# Patient Record
Sex: Male | Born: 1950 | Race: Black or African American | Hispanic: No | Marital: Single | Smoking: Former smoker
Health system: Southern US, Community
[De-identification: ages and names within clinical notes are randomized; demographics above are authoritative.]

## PROBLEM LIST (undated history)

## (undated) DIAGNOSIS — I6990 Unspecified sequelae of unspecified cerebrovascular disease: Secondary | ICD-10-CM

## (undated) DIAGNOSIS — M6281 Muscle weakness (generalized): Secondary | ICD-10-CM

## (undated) DIAGNOSIS — K5900 Constipation, unspecified: Secondary | ICD-10-CM

## (undated) DIAGNOSIS — I639 Cerebral infarction, unspecified: Secondary | ICD-10-CM

## (undated) DIAGNOSIS — G8929 Other chronic pain: Secondary | ICD-10-CM

## (undated) DIAGNOSIS — M1990 Unspecified osteoarthritis, unspecified site: Secondary | ICD-10-CM

## (undated) DIAGNOSIS — E559 Vitamin D deficiency, unspecified: Secondary | ICD-10-CM

## (undated) DIAGNOSIS — C679 Malignant neoplasm of bladder, unspecified: Secondary | ICD-10-CM

## (undated) DIAGNOSIS — Z131 Encounter for screening for diabetes mellitus: Secondary | ICD-10-CM

## (undated) DIAGNOSIS — I498 Other specified cardiac arrhythmias: Secondary | ICD-10-CM

## (undated) DIAGNOSIS — I10 Essential (primary) hypertension: Secondary | ICD-10-CM

## (undated) DIAGNOSIS — N183 Chronic kidney disease, stage 3 unspecified: Secondary | ICD-10-CM

## (undated) DIAGNOSIS — I499 Cardiac arrhythmia, unspecified: Secondary | ICD-10-CM

## (undated) DIAGNOSIS — K2100 Gastro-esophageal reflux disease with esophagitis, without bleeding: Secondary | ICD-10-CM

## (undated) DIAGNOSIS — Z0000 Encounter for general adult medical examination without abnormal findings: Secondary | ICD-10-CM

## (undated) DIAGNOSIS — C801 Malignant (primary) neoplasm, unspecified: Secondary | ICD-10-CM

## (undated) DIAGNOSIS — N189 Chronic kidney disease, unspecified: Secondary | ICD-10-CM

## (undated) DIAGNOSIS — K219 Gastro-esophageal reflux disease without esophagitis: Secondary | ICD-10-CM

## (undated) DIAGNOSIS — IMO0002 Reserved for concepts with insufficient information to code with codable children: Secondary | ICD-10-CM

## (undated) DIAGNOSIS — G609 Hereditary and idiopathic neuropathy, unspecified: Secondary | ICD-10-CM

## (undated) DIAGNOSIS — M62838 Other muscle spasm: Secondary | ICD-10-CM

## (undated) DIAGNOSIS — R7989 Other specified abnormal findings of blood chemistry: Secondary | ICD-10-CM

## (undated) HISTORY — DX: Encounter for screening for diabetes mellitus: Z13.1

## (undated) HISTORY — DX: Other muscle spasm: M62.838

## (undated) HISTORY — DX: Unspecified sequelae of unspecified cerebrovascular disease: I69.90

## (undated) HISTORY — DX: Other specified abnormal findings of blood chemistry: R79.89

## (undated) HISTORY — DX: Muscle weakness (generalized): M62.81

## (undated) HISTORY — DX: Gastro-esophageal reflux disease with esophagitis: K21.0

## (undated) HISTORY — DX: Other chronic pain: G89.29

## (undated) HISTORY — DX: Gastro-esophageal reflux disease with esophagitis, without bleeding: K21.00

## (undated) HISTORY — DX: Vitamin D deficiency, unspecified: E55.9

## (undated) HISTORY — DX: Malignant neoplasm of bladder, unspecified: C67.9

## (undated) HISTORY — DX: Chronic kidney disease, stage 3 unspecified: N18.30

## (undated) HISTORY — DX: Constipation, unspecified: K59.00

## (undated) HISTORY — DX: Encounter for general adult medical examination without abnormal findings: Z00.00

## (undated) HISTORY — DX: Reserved for concepts with insufficient information to code with codable children: IMO0002

## (undated) HISTORY — DX: Chronic kidney disease, stage 3 (moderate): N18.3

## (undated) HISTORY — DX: Hereditary and idiopathic neuropathy, unspecified: G60.9

## (undated) HISTORY — DX: Other specified cardiac arrhythmias: I49.8

---

## 1998-04-27 ENCOUNTER — Observation Stay (HOSPITAL_COMMUNITY): Admission: RE | Admit: 1998-04-27 | Discharge: 1998-04-28 | Payer: Self-pay | Admitting: Urology

## 1998-05-11 ENCOUNTER — Ambulatory Visit (HOSPITAL_COMMUNITY): Admission: RE | Admit: 1998-05-11 | Discharge: 1998-05-11 | Payer: Self-pay | Admitting: Urology

## 1998-05-23 ENCOUNTER — Ambulatory Visit (HOSPITAL_COMMUNITY): Admission: RE | Admit: 1998-05-23 | Discharge: 1998-05-23 | Payer: Self-pay | Admitting: Urology

## 1998-05-23 ENCOUNTER — Encounter: Payer: Self-pay | Admitting: Urology

## 1999-09-18 ENCOUNTER — Encounter: Admission: RE | Admit: 1999-09-18 | Discharge: 1999-09-18 | Payer: Self-pay | Admitting: Nephrology

## 1999-09-18 ENCOUNTER — Encounter: Payer: Self-pay | Admitting: Nephrology

## 1999-12-24 ENCOUNTER — Ambulatory Visit (HOSPITAL_COMMUNITY): Admission: RE | Admit: 1999-12-24 | Discharge: 1999-12-24 | Payer: Self-pay | Admitting: Nephrology

## 1999-12-24 ENCOUNTER — Encounter: Payer: Self-pay | Admitting: Nephrology

## 2002-05-04 ENCOUNTER — Encounter: Payer: Self-pay | Admitting: Emergency Medicine

## 2002-05-04 ENCOUNTER — Emergency Department (HOSPITAL_COMMUNITY): Admission: EM | Admit: 2002-05-04 | Discharge: 2002-05-04 | Payer: Self-pay | Admitting: Emergency Medicine

## 2002-09-21 ENCOUNTER — Encounter: Admission: RE | Admit: 2002-09-21 | Discharge: 2002-09-21 | Payer: Self-pay | Admitting: Internal Medicine

## 2002-09-29 ENCOUNTER — Encounter: Admission: RE | Admit: 2002-09-29 | Discharge: 2002-09-29 | Payer: Self-pay | Admitting: Internal Medicine

## 2002-09-29 ENCOUNTER — Ambulatory Visit (HOSPITAL_COMMUNITY): Admission: RE | Admit: 2002-09-29 | Discharge: 2002-09-29 | Payer: Self-pay | Admitting: Internal Medicine

## 2002-09-29 ENCOUNTER — Encounter: Payer: Self-pay | Admitting: Internal Medicine

## 2002-10-07 ENCOUNTER — Encounter: Admission: RE | Admit: 2002-10-07 | Discharge: 2002-10-07 | Payer: Self-pay | Admitting: Internal Medicine

## 2003-06-29 ENCOUNTER — Encounter: Admission: RE | Admit: 2003-06-29 | Discharge: 2003-06-29 | Payer: Self-pay | Admitting: Internal Medicine

## 2003-06-29 ENCOUNTER — Ambulatory Visit (HOSPITAL_COMMUNITY): Admission: RE | Admit: 2003-06-29 | Discharge: 2003-06-29 | Payer: Self-pay | Admitting: Internal Medicine

## 2003-07-11 ENCOUNTER — Encounter: Admission: RE | Admit: 2003-07-11 | Discharge: 2003-07-11 | Payer: Self-pay | Admitting: Internal Medicine

## 2003-07-13 ENCOUNTER — Encounter: Admission: RE | Admit: 2003-07-13 | Discharge: 2003-07-13 | Payer: Self-pay | Admitting: Internal Medicine

## 2003-07-14 ENCOUNTER — Ambulatory Visit (HOSPITAL_COMMUNITY): Admission: RE | Admit: 2003-07-14 | Discharge: 2003-07-14 | Payer: Self-pay | Admitting: Internal Medicine

## 2003-07-14 ENCOUNTER — Encounter: Payer: Self-pay | Admitting: Cardiology

## 2003-07-27 ENCOUNTER — Encounter: Admission: RE | Admit: 2003-07-27 | Discharge: 2003-07-27 | Payer: Self-pay | Admitting: Internal Medicine

## 2003-08-11 ENCOUNTER — Encounter: Admission: RE | Admit: 2003-08-11 | Discharge: 2003-08-11 | Payer: Self-pay | Admitting: Internal Medicine

## 2003-08-25 ENCOUNTER — Encounter: Admission: RE | Admit: 2003-08-25 | Discharge: 2003-08-25 | Payer: Self-pay | Admitting: Internal Medicine

## 2003-09-19 ENCOUNTER — Encounter: Admission: RE | Admit: 2003-09-19 | Discharge: 2003-09-19 | Payer: Self-pay | Admitting: Internal Medicine

## 2003-10-10 ENCOUNTER — Encounter: Admission: RE | Admit: 2003-10-10 | Discharge: 2003-10-10 | Payer: Self-pay | Admitting: Internal Medicine

## 2003-11-10 ENCOUNTER — Encounter: Admission: RE | Admit: 2003-11-10 | Discharge: 2003-11-10 | Payer: Self-pay | Admitting: Internal Medicine

## 2003-11-30 ENCOUNTER — Encounter: Admission: RE | Admit: 2003-11-30 | Discharge: 2003-11-30 | Payer: Self-pay | Admitting: Internal Medicine

## 2003-12-23 ENCOUNTER — Encounter: Admission: RE | Admit: 2003-12-23 | Discharge: 2003-12-23 | Payer: Self-pay | Admitting: Internal Medicine

## 2004-01-04 ENCOUNTER — Ambulatory Visit (HOSPITAL_BASED_OUTPATIENT_CLINIC_OR_DEPARTMENT_OTHER): Admission: RE | Admit: 2004-01-04 | Discharge: 2004-01-04 | Payer: Self-pay | Admitting: Orthopedic Surgery

## 2004-01-04 ENCOUNTER — Ambulatory Visit (HOSPITAL_COMMUNITY): Admission: RE | Admit: 2004-01-04 | Discharge: 2004-01-04 | Payer: Self-pay | Admitting: Orthopedic Surgery

## 2004-01-19 ENCOUNTER — Encounter: Admission: RE | Admit: 2004-01-19 | Discharge: 2004-04-18 | Payer: Self-pay | Admitting: Orthopedic Surgery

## 2004-03-02 ENCOUNTER — Encounter: Admission: RE | Admit: 2004-03-02 | Discharge: 2004-03-02 | Payer: Self-pay | Admitting: Internal Medicine

## 2004-03-02 ENCOUNTER — Inpatient Hospital Stay (HOSPITAL_COMMUNITY): Admission: RE | Admit: 2004-03-02 | Discharge: 2004-03-03 | Payer: Self-pay | Admitting: Internal Medicine

## 2004-03-06 ENCOUNTER — Encounter: Admission: RE | Admit: 2004-03-06 | Discharge: 2004-03-06 | Payer: Self-pay | Admitting: Internal Medicine

## 2004-03-09 ENCOUNTER — Encounter: Admission: RE | Admit: 2004-03-09 | Discharge: 2004-03-09 | Payer: Self-pay | Admitting: Internal Medicine

## 2004-03-12 ENCOUNTER — Encounter: Admission: RE | Admit: 2004-03-12 | Discharge: 2004-03-12 | Payer: Self-pay | Admitting: Internal Medicine

## 2005-06-21 ENCOUNTER — Ambulatory Visit: Payer: Self-pay | Admitting: Internal Medicine

## 2005-07-17 ENCOUNTER — Ambulatory Visit: Payer: Self-pay | Admitting: *Deleted

## 2005-07-18 ENCOUNTER — Ambulatory Visit: Payer: Self-pay | Admitting: Internal Medicine

## 2005-08-07 ENCOUNTER — Ambulatory Visit: Payer: Self-pay | Admitting: Internal Medicine

## 2005-08-19 ENCOUNTER — Ambulatory Visit: Payer: Self-pay | Admitting: Internal Medicine

## 2005-08-23 ENCOUNTER — Ambulatory Visit: Payer: Self-pay | Admitting: Internal Medicine

## 2005-09-06 ENCOUNTER — Ambulatory Visit: Payer: Self-pay | Admitting: Internal Medicine

## 2005-09-24 ENCOUNTER — Ambulatory Visit: Payer: Self-pay | Admitting: Internal Medicine

## 2005-09-27 ENCOUNTER — Ambulatory Visit (HOSPITAL_COMMUNITY): Admission: RE | Admit: 2005-09-27 | Discharge: 2005-09-27 | Payer: Self-pay | Admitting: Internal Medicine

## 2006-04-13 ENCOUNTER — Inpatient Hospital Stay (HOSPITAL_COMMUNITY): Admission: EM | Admit: 2006-04-13 | Discharge: 2006-04-17 | Payer: Self-pay | Admitting: Emergency Medicine

## 2006-04-13 ENCOUNTER — Encounter (INDEPENDENT_AMBULATORY_CARE_PROVIDER_SITE_OTHER): Payer: Self-pay | Admitting: Cardiology

## 2006-04-13 ENCOUNTER — Encounter (INDEPENDENT_AMBULATORY_CARE_PROVIDER_SITE_OTHER): Payer: Self-pay | Admitting: Neurology

## 2006-04-16 ENCOUNTER — Ambulatory Visit: Payer: Self-pay | Admitting: Physical Medicine & Rehabilitation

## 2006-04-17 ENCOUNTER — Inpatient Hospital Stay (HOSPITAL_COMMUNITY)
Admission: RE | Admit: 2006-04-17 | Discharge: 2006-05-16 | Payer: Self-pay | Admitting: Physical Medicine & Rehabilitation

## 2006-11-06 ENCOUNTER — Ambulatory Visit: Payer: Self-pay | Admitting: Internal Medicine

## 2007-03-12 ENCOUNTER — Ambulatory Visit: Payer: Self-pay | Admitting: Internal Medicine

## 2007-03-19 ENCOUNTER — Encounter (INDEPENDENT_AMBULATORY_CARE_PROVIDER_SITE_OTHER): Payer: Self-pay | Admitting: Nurse Practitioner

## 2007-03-19 ENCOUNTER — Ambulatory Visit: Payer: Self-pay | Admitting: Internal Medicine

## 2007-03-19 LAB — CONVERTED CEMR LAB
Bilirubin Urine: NEGATIVE
Ketones, ur: NEGATIVE mg/dL
Leukocytes, UA: NEGATIVE
Urobilinogen, UA: 0.2

## 2007-03-20 ENCOUNTER — Encounter (INDEPENDENT_AMBULATORY_CARE_PROVIDER_SITE_OTHER): Payer: Self-pay | Admitting: Nurse Practitioner

## 2007-03-20 LAB — CONVERTED CEMR LAB
ALT: 9 units/L
CO2: 22 meq/L
Chloride: 105 meq/L
Glucose, Bld: 114 mg/dL
HCT: 47.8 %
Hemoglobin: 15.7 g/dL
LDL Cholesterol: 136 mg/dL
MCHC: 32.8 g/dL
MCV: 89.7 fL
Platelets: 244 10*3/uL
Potassium: 3.6 meq/L
VLDL: 18 mg/dL
WBC: 4.6 10*3/uL

## 2007-03-29 ENCOUNTER — Emergency Department (HOSPITAL_COMMUNITY): Admission: EM | Admit: 2007-03-29 | Discharge: 2007-03-30 | Payer: Self-pay | Admitting: Emergency Medicine

## 2007-05-18 ENCOUNTER — Telehealth (INDEPENDENT_AMBULATORY_CARE_PROVIDER_SITE_OTHER): Payer: Self-pay | Admitting: *Deleted

## 2007-05-19 ENCOUNTER — Encounter (INDEPENDENT_AMBULATORY_CARE_PROVIDER_SITE_OTHER): Payer: Self-pay | Admitting: Nurse Practitioner

## 2007-05-19 DIAGNOSIS — Z87898 Personal history of other specified conditions: Secondary | ICD-10-CM

## 2007-05-19 DIAGNOSIS — I6350 Cerebral infarction due to unspecified occlusion or stenosis of unspecified cerebral artery: Secondary | ICD-10-CM | POA: Insufficient documentation

## 2007-05-19 DIAGNOSIS — M1990 Unspecified osteoarthritis, unspecified site: Secondary | ICD-10-CM | POA: Insufficient documentation

## 2007-05-19 DIAGNOSIS — I10 Essential (primary) hypertension: Secondary | ICD-10-CM | POA: Insufficient documentation

## 2007-05-19 DIAGNOSIS — E785 Hyperlipidemia, unspecified: Secondary | ICD-10-CM

## 2007-05-20 ENCOUNTER — Encounter (INDEPENDENT_AMBULATORY_CARE_PROVIDER_SITE_OTHER): Payer: Self-pay | Admitting: *Deleted

## 2007-05-25 ENCOUNTER — Ambulatory Visit: Payer: Self-pay | Admitting: Nurse Practitioner

## 2007-05-25 LAB — CONVERTED CEMR LAB
HDL goal, serum: 40 mg/dL
LDL Goal: 100 mg/dL

## 2007-07-06 ENCOUNTER — Ambulatory Visit: Payer: Self-pay | Admitting: Nurse Practitioner

## 2007-08-19 ENCOUNTER — Ambulatory Visit: Payer: Self-pay | Admitting: Nurse Practitioner

## 2007-08-21 LAB — CONVERTED CEMR LAB
ALT: 8 units/L (ref 0–53)
Calcium: 9.5 mg/dL (ref 8.4–10.5)
Cholesterol: 226 mg/dL — ABNORMAL HIGH (ref 0–200)
Creatinine, Ser: 1.58 mg/dL — ABNORMAL HIGH (ref 0.40–1.50)
HDL: 64 mg/dL (ref >39)
Sodium: 141 meq/L (ref 135–145)
Total CHOL/HDL Ratio: 3.5
Total Protein: 8.1 g/dL (ref 6.0–8.3)

## 2007-10-12 ENCOUNTER — Ambulatory Visit: Payer: Self-pay | Admitting: Nurse Practitioner

## 2007-11-09 ENCOUNTER — Ambulatory Visit: Payer: Self-pay | Admitting: Nurse Practitioner

## 2007-11-23 ENCOUNTER — Ambulatory Visit: Payer: Self-pay | Admitting: Family Medicine

## 2007-12-25 ENCOUNTER — Ambulatory Visit: Payer: Self-pay | Admitting: Nurse Practitioner

## 2007-12-25 LAB — CONVERTED CEMR LAB
BUN: 20 mg/dL (ref 6–23)
Basophils Absolute: 0 10*3/uL (ref 0.0–0.1)
Calcium: 9.4 mg/dL (ref 8.4–10.5)
Chloride: 108 meq/L (ref 96–112)
Creatinine, Ser: 1.68 mg/dL — ABNORMAL HIGH (ref 0.40–1.50)
Eosinophils Absolute: 0 10*3/uL (ref 0.0–0.7)
Eosinophils Relative: 0 % (ref 0–5)
Glucose, Bld: 77 mg/dL (ref 70–99)
HCT: 46.7 % (ref 39.0–52.0)
Lymphocytes Relative: 42 % (ref 12–46)
Lymphs Abs: 1.8 10*3/uL (ref 0.7–4.0)
Monocytes Relative: 8 % (ref 3–12)
Neutro Abs: 2.1 10*3/uL (ref 1.7–7.7)
Neutrophils Relative %: 49 % (ref 43–77)
RBC: 5.23 M/uL (ref 4.22–5.81)
Sodium: 144 meq/L (ref 135–145)
Total Protein: 7.7 g/dL (ref 6.0–8.3)

## 2008-01-08 ENCOUNTER — Ambulatory Visit: Payer: Self-pay | Admitting: Internal Medicine

## 2008-02-17 ENCOUNTER — Ambulatory Visit: Payer: Self-pay | Admitting: Nurse Practitioner

## 2008-02-17 DIAGNOSIS — M79609 Pain in unspecified limb: Secondary | ICD-10-CM

## 2008-03-02 ENCOUNTER — Encounter (INDEPENDENT_AMBULATORY_CARE_PROVIDER_SITE_OTHER): Payer: Self-pay | Admitting: Nurse Practitioner

## 2008-03-02 ENCOUNTER — Encounter: Admission: RE | Admit: 2008-03-02 | Discharge: 2008-04-22 | Payer: Self-pay | Admitting: Nurse Practitioner

## 2008-03-18 ENCOUNTER — Encounter (INDEPENDENT_AMBULATORY_CARE_PROVIDER_SITE_OTHER): Payer: Self-pay | Admitting: Nurse Practitioner

## 2008-03-28 ENCOUNTER — Encounter (INDEPENDENT_AMBULATORY_CARE_PROVIDER_SITE_OTHER): Payer: Self-pay | Admitting: Nurse Practitioner

## 2008-04-21 ENCOUNTER — Encounter (INDEPENDENT_AMBULATORY_CARE_PROVIDER_SITE_OTHER): Payer: Self-pay | Admitting: Nurse Practitioner

## 2008-05-11 ENCOUNTER — Ambulatory Visit: Payer: Self-pay | Admitting: Nurse Practitioner

## 2008-06-17 ENCOUNTER — Emergency Department (HOSPITAL_COMMUNITY): Admission: EM | Admit: 2008-06-17 | Discharge: 2008-06-17 | Payer: Self-pay | Admitting: Emergency Medicine

## 2008-07-08 ENCOUNTER — Ambulatory Visit: Payer: Self-pay | Admitting: Nurse Practitioner

## 2008-07-15 ENCOUNTER — Telehealth (INDEPENDENT_AMBULATORY_CARE_PROVIDER_SITE_OTHER): Payer: Self-pay | Admitting: Nurse Practitioner

## 2008-08-23 ENCOUNTER — Ambulatory Visit: Payer: Self-pay | Admitting: Nurse Practitioner

## 2008-08-24 LAB — CONVERTED CEMR LAB
AST: 13 units/L (ref 0–37)
Basophils Relative: 0 % (ref 0–1)
Calcium: 9.5 mg/dL (ref 8.4–10.5)
Cholesterol: 176 mg/dL (ref 0–200)
Creatinine, Ser: 1.6 mg/dL — ABNORMAL HIGH (ref 0.40–1.50)
Eosinophils Absolute: 0 10*3/uL (ref 0.0–0.7)
Glucose, Bld: 86 mg/dL (ref 70–99)
HDL: 60 mg/dL (ref >39)
Hemoglobin: 14.8 g/dL (ref 13.0–17.0)
LDL Cholesterol: 98 mg/dL (ref 0–99)
Lymphocytes Relative: 39 % (ref 12–46)
MCHC: 32.2 g/dL (ref 30.0–36.0)
MCV: 89.8 fL (ref 78.0–100.0)
Monocytes Relative: 8 % (ref 3–12)
Sodium: 142 meq/L (ref 135–145)
Total Bilirubin: 1.3 mg/dL — ABNORMAL HIGH (ref 0.3–1.2)
Total CHOL/HDL Ratio: 2.9
Total Protein: 7.8 g/dL (ref 6.0–8.3)
Triglycerides: 88 mg/dL (ref <150)
VLDL: 18 mg/dL (ref 0–40)

## 2008-09-05 ENCOUNTER — Encounter (INDEPENDENT_AMBULATORY_CARE_PROVIDER_SITE_OTHER): Payer: Self-pay | Admitting: Nurse Practitioner

## 2008-09-07 ENCOUNTER — Encounter: Payer: Self-pay | Admitting: Cardiology

## 2008-09-07 ENCOUNTER — Encounter (INDEPENDENT_AMBULATORY_CARE_PROVIDER_SITE_OTHER): Payer: Self-pay | Admitting: Nurse Practitioner

## 2008-09-07 ENCOUNTER — Ambulatory Visit: Payer: Self-pay | Admitting: Vascular Surgery

## 2008-09-07 ENCOUNTER — Ambulatory Visit: Admission: RE | Admit: 2008-09-07 | Discharge: 2008-09-07 | Payer: Self-pay | Admitting: Cardiology

## 2008-09-08 ENCOUNTER — Encounter (INDEPENDENT_AMBULATORY_CARE_PROVIDER_SITE_OTHER): Payer: Self-pay | Admitting: Nurse Practitioner

## 2008-09-09 ENCOUNTER — Encounter (INDEPENDENT_AMBULATORY_CARE_PROVIDER_SITE_OTHER): Payer: Self-pay | Admitting: Nurse Practitioner

## 2008-11-04 ENCOUNTER — Encounter (INDEPENDENT_AMBULATORY_CARE_PROVIDER_SITE_OTHER): Payer: Self-pay | Admitting: Nurse Practitioner

## 2008-11-10 ENCOUNTER — Encounter (INDEPENDENT_AMBULATORY_CARE_PROVIDER_SITE_OTHER): Payer: Self-pay | Admitting: Nurse Practitioner

## 2008-11-11 ENCOUNTER — Ambulatory Visit: Payer: Self-pay | Admitting: Nurse Practitioner

## 2008-12-28 ENCOUNTER — Encounter (INDEPENDENT_AMBULATORY_CARE_PROVIDER_SITE_OTHER): Payer: Self-pay | Admitting: Nurse Practitioner

## 2009-01-11 ENCOUNTER — Ambulatory Visit: Payer: Self-pay | Admitting: Nurse Practitioner

## 2009-01-11 LAB — CONVERTED CEMR LAB
ALT: 10 units/L (ref 0–53)
AST: 14 units/L (ref 0–37)
CO2: 22 meq/L (ref 19–32)
Calcium: 8.8 mg/dL (ref 8.4–10.5)
Glucose, Bld: 88 mg/dL (ref 70–99)
HDL: 57 mg/dL (ref >39)
LDL Cholesterol: 109 mg/dL — ABNORMAL HIGH (ref 0–99)
Sodium: 140 meq/L (ref 135–145)
Triglycerides: 85 mg/dL (ref <150)

## 2009-01-12 ENCOUNTER — Encounter (INDEPENDENT_AMBULATORY_CARE_PROVIDER_SITE_OTHER): Payer: Self-pay | Admitting: Nurse Practitioner

## 2009-06-08 ENCOUNTER — Ambulatory Visit: Payer: Self-pay | Admitting: Nurse Practitioner

## 2009-06-08 DIAGNOSIS — L03039 Cellulitis of unspecified toe: Secondary | ICD-10-CM

## 2009-06-12 ENCOUNTER — Encounter (INDEPENDENT_AMBULATORY_CARE_PROVIDER_SITE_OTHER): Payer: Self-pay | Admitting: Nurse Practitioner

## 2009-06-23 ENCOUNTER — Ambulatory Visit: Payer: Self-pay | Admitting: Nurse Practitioner

## 2009-07-12 ENCOUNTER — Ambulatory Visit: Payer: Self-pay | Admitting: Nurse Practitioner

## 2009-08-04 LAB — HM COLONOSCOPY

## 2009-08-23 ENCOUNTER — Ambulatory Visit: Payer: Self-pay | Admitting: Nurse Practitioner

## 2009-11-07 ENCOUNTER — Ambulatory Visit: Payer: Self-pay | Admitting: Nurse Practitioner

## 2009-11-07 ENCOUNTER — Emergency Department (HOSPITAL_COMMUNITY): Admission: EM | Admit: 2009-11-07 | Discharge: 2009-11-07 | Payer: Self-pay | Admitting: Family Medicine

## 2009-12-15 ENCOUNTER — Ambulatory Visit: Payer: Self-pay | Admitting: Nurse Practitioner

## 2009-12-15 DIAGNOSIS — M25519 Pain in unspecified shoulder: Secondary | ICD-10-CM

## 2009-12-18 ENCOUNTER — Encounter (INDEPENDENT_AMBULATORY_CARE_PROVIDER_SITE_OTHER): Payer: Self-pay | Admitting: Nurse Practitioner

## 2009-12-18 ENCOUNTER — Ambulatory Visit (HOSPITAL_COMMUNITY): Admission: RE | Admit: 2009-12-18 | Discharge: 2009-12-18 | Payer: Self-pay | Admitting: Nurse Practitioner

## 2009-12-21 ENCOUNTER — Ambulatory Visit: Payer: Self-pay | Admitting: Nurse Practitioner

## 2010-01-11 ENCOUNTER — Telehealth (INDEPENDENT_AMBULATORY_CARE_PROVIDER_SITE_OTHER): Payer: Self-pay | Admitting: *Deleted

## 2010-01-12 ENCOUNTER — Encounter (INDEPENDENT_AMBULATORY_CARE_PROVIDER_SITE_OTHER): Payer: Self-pay | Admitting: Nurse Practitioner

## 2010-01-18 ENCOUNTER — Ambulatory Visit: Payer: Self-pay | Admitting: Nurse Practitioner

## 2010-02-08 ENCOUNTER — Ambulatory Visit: Payer: Self-pay | Admitting: Nurse Practitioner

## 2010-02-08 LAB — CONVERTED CEMR LAB
ALT: 8 units/L (ref 0–53)
Albumin: 3.9 g/dL (ref 3.5–5.2)
Alkaline Phosphatase: 54 units/L (ref 39–117)
BUN: 19 mg/dL (ref 6–23)
CO2: 22 meq/L (ref 19–32)
Calcium: 8.8 mg/dL (ref 8.4–10.5)
Eosinophils Absolute: 0.1 10*3/uL (ref 0.0–0.7)
Glucose, Bld: 125 mg/dL — ABNORMAL HIGH (ref 70–99)
Lymphocytes Relative: 35 % (ref 12–46)
Lymphs Abs: 1.8 10*3/uL (ref 0.7–4.0)
Monocytes Absolute: 0.3 10*3/uL (ref 0.1–1.0)
Neutro Abs: 2.9 10*3/uL (ref 1.7–7.7)
Platelets: 241 10*3/uL (ref 150–400)
RDW: 14.8 % (ref 11.5–15.5)
Sodium: 142 meq/L (ref 135–145)
Total Bilirubin: 0.4 mg/dL (ref 0.3–1.2)
Total Protein: 6.5 g/dL (ref 6.0–8.3)
Triglycerides: 74 mg/dL (ref <150)

## 2010-02-09 ENCOUNTER — Encounter (INDEPENDENT_AMBULATORY_CARE_PROVIDER_SITE_OTHER): Payer: Self-pay | Admitting: Nurse Practitioner

## 2010-04-14 ENCOUNTER — Emergency Department (HOSPITAL_COMMUNITY): Admission: EM | Admit: 2010-04-14 | Discharge: 2010-04-14 | Payer: Self-pay | Admitting: Emergency Medicine

## 2010-04-20 ENCOUNTER — Ambulatory Visit: Payer: Self-pay | Admitting: Nurse Practitioner

## 2010-04-20 ENCOUNTER — Telehealth (INDEPENDENT_AMBULATORY_CARE_PROVIDER_SITE_OTHER): Payer: Self-pay | Admitting: Nurse Practitioner

## 2010-04-26 ENCOUNTER — Encounter (INDEPENDENT_AMBULATORY_CARE_PROVIDER_SITE_OTHER): Payer: Self-pay | Admitting: Nurse Practitioner

## 2010-05-10 ENCOUNTER — Encounter (INDEPENDENT_AMBULATORY_CARE_PROVIDER_SITE_OTHER): Payer: Self-pay | Admitting: Nurse Practitioner

## 2010-05-21 ENCOUNTER — Ambulatory Visit: Payer: Self-pay | Admitting: Nurse Practitioner

## 2010-06-14 ENCOUNTER — Encounter (INDEPENDENT_AMBULATORY_CARE_PROVIDER_SITE_OTHER): Payer: Self-pay | Admitting: Nurse Practitioner

## 2010-06-14 DIAGNOSIS — K648 Other hemorrhoids: Secondary | ICD-10-CM | POA: Insufficient documentation

## 2010-06-14 DIAGNOSIS — K5730 Diverticulosis of large intestine without perforation or abscess without bleeding: Secondary | ICD-10-CM | POA: Insufficient documentation

## 2010-06-19 ENCOUNTER — Encounter (INDEPENDENT_AMBULATORY_CARE_PROVIDER_SITE_OTHER): Payer: Self-pay | Admitting: Nurse Practitioner

## 2010-07-23 ENCOUNTER — Ambulatory Visit: Payer: Self-pay | Admitting: Nurse Practitioner

## 2010-08-30 ENCOUNTER — Ambulatory Visit: Payer: Self-pay | Admitting: Nurse Practitioner

## 2010-10-02 ENCOUNTER — Encounter (INDEPENDENT_AMBULATORY_CARE_PROVIDER_SITE_OTHER): Payer: Self-pay | Admitting: Nurse Practitioner

## 2010-10-04 ENCOUNTER — Encounter (INDEPENDENT_AMBULATORY_CARE_PROVIDER_SITE_OTHER): Payer: Self-pay | Admitting: Nurse Practitioner

## 2010-10-04 NOTE — Assessment & Plan Note (Signed)
Summary: HTN   Vital Signs:  Patient profile:   60 year old male Weight:      171.0 pounds BMI:     26.09 Temp:     97.6 degrees F oral Pulse rate:   70 / minute Pulse rhythm:   regular Resp:     20 per minute BP sitting:   163 / 107  (left arm) Cuff size:   regular  Vitals Entered By: Levon Hedger (April 20, 2010 10:18 AM)  Nutrition Counseling: Patient's BMI is greater than 25 and therefore counseled on weight management options. CC: follow-up visit 3 month BP, Lipid Management Is Patient Diabetic? No Pain Assessment Patient in pain? no       Does patient need assistance? Functional Status Self care Ambulation Normal   CC:  follow-up visit 3 month BP and Lipid Management.  History of Present Illness:  Pt into the office for f/u on blood pressure. Pt reports that he has not been taking his medications as ordered. Last month his blood pressure was doing great when he was taking all his medications as ordered.  Pt did NOT bring meds into the office.    Lipid Management History:      Positive NCEP/ATP III risk factors include male age 28 years old or older, hypertension, and prior stroke (or TIA).  Negative NCEP/ATP III risk factors include non-diabetic, non-tobacco-user status, no ASHD (atherosclerotic heart disease), no peripheral vascular disease, and no history of aortic aneurysm.        The patient states that he knows about the "Therapeutic Lifestyle Change" diet.  His compliance with the TLC diet is poor.  The patient does not know about adjunctive measures for cholesterol lowering.  He expresses no side effects from his lipid-lowering medication.  Comments include: advised pt to take meds as ordered.  The patient denies any symptoms to suggest myopathy or liver disease.     Allergies (verified): 1)  ! Penicillin 2)  ! * Lyrica  Review of Systems General:  Denies fever. CV:  Denies chest pain or discomfort. Resp:  Denies cough. GI:  Denies abdominal  pain, nausea, and vomiting. GU:  Complains of hematuria; One episode since his last visit here. MS:  left leg weakness - supposed to wear a brace to the left leg but pt reports that his leg is doing well so he is not wearing the brace today.Marland Kitchen  Physical Exam  General:  alert.   Head:  normocephalic.   Lungs:  normal breath sounds.   Heart:  normal rate and regular rhythm.   Abdomen:  normal bowel sounds.   Rectal:  no external abnormalities.  guaiac negative Prostate:  prostate enlargement R>L Msk:  left side hemiparesis Neurologic:  cane   Impression & Recommendations:  Problem # 1:  HYPERTENSION (ICD-401.9) BP is elevated but pt has not taken meds Advised pt that he really needs to take meds as ordered His updated medication list for this problem includes:    Cardura 4 Mg Tabs (Doxazosin mesylate) ..... One tablet by mouth nightly    Clonidine Hcl 0.1 Mg Tabs (Clonidine hcl) .Marland Kitchen... 1 tablet by mouth two times a day    Lisinopril 40 Mg Tabs (Lisinopril) .Marland Kitchen... 1 tablet by mouth daily for blood pressure    Norvasc 10 Mg Tabs (Amlodipine besylate) ..... One tablet by mouth daily for blood pressure  Problem # 2:  HYPERLIPIDEMIA (ICD-272.4)  His updated medication list for this problem includes:    Pravastatin  Sodium 40 Mg Tabs (Pravastatin sodium) .Marland Kitchen... 1 tablet by mouth at night for cholesterol  Problem # 3:  BENIGN PROSTATIC HYPERTROPHY, HX OF (ICD-V13.8) prostate exam done today pt needs to take med for BPH daily Orders: Hemoccult Guaiac-1 spec.(in office) (82270) Colonoscopy (Colon)  Problem # 4:  LEG PAIN, LEFT (ICD-729.5) advised pt that he needs to wear a brace  Complete Medication List: 1)  Cardura 4 Mg Tabs (Doxazosin mesylate) .... One tablet by mouth nightly 2)  Pravastatin Sodium 40 Mg Tabs (Pravastatin sodium) .Marland Kitchen.. 1 tablet by mouth at night for cholesterol 3)  Multivitamins Tabs (Multiple vitamin) .Marland Kitchen.. 1 tablet by mouth daily 4)  Aspirin Ec 325 Mg Tbec  (Aspirin) .... Take one (1) tablet each day 5)  Clonidine Hcl 0.1 Mg Tabs (Clonidine hcl) .Marland Kitchen.. 1 tablet by mouth two times a day 6)  Ibuprofen 800 Mg Tabs (Ibuprofen) .Marland Kitchen.. 1 tablet by mouth two times a day as needed for pain 7)  Lisinopril 40 Mg Tabs (Lisinopril) .Marland Kitchen.. 1 tablet by mouth daily for blood pressure 8)  Norvasc 10 Mg Tabs (Amlodipine besylate) .... One tablet by mouth daily for blood pressure 9)  Tramadol Hcl 50 Mg Tabs (Tramadol hcl) .... One tablet by mouth two times a day as needed  Lipid Assessment/Plan:      Based on NCEP/ATP III, the patient's risk factor category is "history of coronary disease, peripheral vascular disease, cerebrovascular disease, or aortic aneurysm".  The patient's lipid goals are as follows: Total cholesterol goal is 200; LDL cholesterol goal is 100; HDL cholesterol goal is 40; Triglyceride goal is 150.  His LDL cholesterol goal has not been met.  He has been provided with dietary instructions to lower his cholesterol.    Patient Instructions: 1)  You will be referred for a colonscopy.  You will be notified of the time/date of the appointment. 2)  [facility] - I will see if you can start getting your medications delivered to your home 3)  Follow up in 1 month with n.martin,fnp for blood pressure.   4)  Take your medications before this visit.    Laboratory Results  Date/Time Received: April 20, 2010 12:03 PM   Stool - Occult Blood Hemmoccult #1: negative Date: 04/20/2010

## 2010-10-04 NOTE — Progress Notes (Signed)
Summary: Pharmacy Referral  Phone Note Outgoing Call   Summary of Call: [facility] - see me about this for the referral (you should have sheet from last week) Pt is on over 8 meds and he also had medicare pt also needs a colonscopy - referral ordered Initial call taken by: Lehman Prom FNP,  April 20, 2010 11:50 AM  Follow-up for Phone Call        I SEND THE REFERRAL TO [facility] . Follow-up by: Cheryll Dessert,  April 20, 2010 12:30 PM

## 2010-10-04 NOTE — Progress Notes (Signed)
Summary: Handicap Placcard  Phone Note Call from Patient   Summary of Call: PT BROUGHT  BY HANDICAPPED PLACARD/PUT IN YOUR REFILL SLOT Initial call taken by: Arta Bruce,  Jan 11, 2010 11:56 AM  Follow-up for Phone Call        form completed and in office make copy for chart pt to pick up original Follow-up by: Lehman Prom FNP,  Jan 11, 2010 2:34 PM  Additional Follow-up for Phone Call Additional follow up Details #1::        CALLED PT TO PICK UP Additional Follow-up by: Arta Bruce,  Jan 12, 2010 9:30 AM

## 2010-10-04 NOTE — Letter (Signed)
Summary: TEST ORDER FORM/REFAXED//RADOIOLOGY  TEST ORDER FORM/REFAXED//RADOIOLOGY   Imported By: Arta Bruce 12/18/2009 11:24:56  _____________________________________________________________________  External Attachment:    Type:   Image     Comment:   External Document

## 2010-10-04 NOTE — Letter (Signed)
Summary: *HSN Results Follow up  [facility]  [address] [place], [place] [address]   Phone: [phone]  Fax: [phone]      02/09/2010   [name] [address] [place], [place]  [address]   Dear  Mr. William Collier,                            ____S.Drinkard,FNP   ____D. Gore,FNP       ____B. McPherson,MD   ____V. Rankins,MD    ____E. Mulberry,MD    __X__N. Daphine Deutscher, FNP  ____D. Reche Dixon, MD    ____K. Philipp Deputy, MD    ____Other     This letter is to inform you that your recent test(s):  _______Pap Smear    ___X____Lab Test     _______X-ray     Comments:  Labs done during your last office visis are ok except one of your kidney labs is slightly elevated.  This lab has been elevated for the past year without any significant change so will just monitor for now.         _________________________________________________________ If you have any questions, please contact our office [phone].                    Sincerely,    Lehman Prom FNP [facility]

## 2010-10-04 NOTE — Medication Information (Signed)
Summary: NEW PT MEDICIATION REQUEST//FAXED  NEW PT MEDICIATION REQUEST//FAXED   Imported By: Arta Bruce 05/10/2010 09:25:50  _____________________________________________________________________  External Attachment:    Type:   Image     Comment:   External Document

## 2010-10-04 NOTE — Letter (Signed)
Summary: [facility]  [facility]   Imported By: Arta Bruce 06/20/2010 11:48:01  _____________________________________________________________________  External Attachment:    Type:   Image     Comment:   External Document

## 2010-10-04 NOTE — Miscellaneous (Signed)
Summary: Colonscopy results  Clinical Lists Changes  Problems: Added new problem of DIVERTICULOSIS, COLON (ICD-562.10) - noted on colonscopy Added new problem of INTERNAL HEMORRHOIDS (ICD-455.0) - noted on colonscopy Observations: Added new observation of COLONNXTDUE: 06/2013 (06/19/2010 14:10) Added new observation of COLONRECACT: Repeat colonoscopy in 3 years due to number of polyps and large size   (06/14/2010 14:11) Added new observation of COLONOSCOPY: Colon polyps - multiple, all resected with snare technique diverticulosis, universal mild internal hemorrhoids (06/14/2010 14:11)      Colonoscopy  Procedure date:  06/14/2010  Findings:      Colon polyps - multiple, all resected with snare technique diverticulosis, universal mild internal hemorrhoids  Comments:      Repeat colonoscopy in 3 years due to number of polyps and large size    Procedures Next Due Date:    Colonoscopy: 06/2013   Colonoscopy  Procedure date:  06/14/2010  Findings:      Colon polyps - multiple, all resected with snare technique diverticulosis, universal mild internal hemorrhoids  Comments:      Repeat colonoscopy in 3 years due to number of polyps and large size    Procedures Next Due Date:    Colonoscopy: 06/2013

## 2010-10-04 NOTE — Assessment & Plan Note (Signed)
Summary: HTN   Vital Signs:  Patient profile:   60 year old male Weight:      173.7 pounds BMI:     26.51 Temp:     97.2 degrees F oral Pulse rate:   80 / minute Pulse rhythm:   regular Resp:     20 per minute BP sitting:   126 / 90  (left arm) Cuff size:   regular  Vitals Entered By: Levon Hedger (August 30, 2010 11:57 AM)  Nutrition Counseling: Patient's BMI is greater than 25 and therefore counseled on weight management options. CC: follow-up visit Bp...pain in Left arm...pt states sometimes it feels like this arm is going to fall off., Hypertension Management, Lipid Management Pain Assessment Patient in pain? yes     Location: left arm Intensity: 4-5  Does patient need assistance? Functional Status Self care Ambulation Normal   CC:  follow-up visit Bp...pain in Left arm...pt states sometimes it feels like this arm is going to fall off., Hypertension Management, and Lipid Management.  History of Present Illness:  Pt into the office for f/u on htn  Pt did not bring his medications today but he was established with [facility]. They are delivering the medications to his home.  He is being complaint with meds.  Pt is in his usual state of health  Hypertension History:      He denies headache, chest pain, and palpitations.  He notes no problems with any antihypertensive medication side effects.  Pt is taking meds as ordered.        Positive major cardiovascular risk factors include male age 45 years old or older, hyperlipidemia, and hypertension.  Negative major cardiovascular risk factors include no history of diabetes and non-tobacco-user status.        Positive history for target organ damage include prior stroke (or TIA).  Further assessment for target organ damage reveals no history of ASHD, cardiac end-organ damage (CHF/LVH), peripheral vascular disease, renal insufficiency, or hypertensive retinopathy.    Lipid Management History:      Positive  NCEP/ATP III risk factors include male age 35 years old or older, hypertension, and prior stroke (or TIA).  Negative NCEP/ATP III risk factors include non-diabetic, non-tobacco-user status, no ASHD (atherosclerotic heart disease), no peripheral vascular disease, and no history of aortic aneurysm.        The patient states that he knows about the "Therapeutic Lifestyle Change" diet.  His compliance with the TLC diet is poor.  The patient does not know about adjunctive measures for cholesterol lowering.  He expresses no side effects from his lipid-lowering medication.  The patient denies any symptoms to suggest myopathy or liver disease.      Allergies (verified): 1)  ! Penicillin 2)  ! * Lyrica  Review of Systems CV:  Denies chest pain or discomfort. Resp:  Denies cough. GI:  Denies abdominal pain, nausea, and vomiting. MS:  Complains of joint pain; left hand -pt has ibuprofen but he is taking very infrequently.  Physical Exam  General:  alert.   Head:  normocephalic.   Mouth:  poor dentition.   Lungs:  normal breath sounds.   Heart:  normal rate and regular rhythm.   Abdomen:  normal bowel sounds.   Msk:  left sided weakness Neurologic:  alert & oriented X3.   Skin:  color normal.   Psych:  Oriented X3.     Impression & Recommendations:  Problem # 1:  HYPERTENSION (ICD-401.9) BP is doing well.  Pt is FINALLY complaint with meds.  His updated medication list for this problem includes:    Cardura 4 Mg Tabs (Doxazosin mesylate) ..... One tablet by mouth nightly    Clonidine Hcl 0.1 Mg Tabs (Clonidine hcl) .Marland Kitchen... 1 tablet by mouth two times a day    Lisinopril 40 Mg Tabs (Lisinopril) .Marland Kitchen... 1 tablet by mouth daily for blood pressure    Norvasc 10 Mg Tabs (Amlodipine besylate) ..... One tablet by mouth daily for blood pressure  Problem # 2:  HYPERLIPIDEMIA (ICD-272.4)  His updated medication list for this problem includes:    Pravastatin Sodium 40 Mg Tabs (Pravastatin sodium)  .Marland Kitchen... 1 tablet by mouth at night for cholesterol  Problem # 3:  CVA (ICD-434.91)  His updated medication list for this problem includes:    Aspirin Ec 325 Mg Tbec (Aspirin) .Marland Kitchen... Take one (1) tablet each day  Problem # 4:  DEGENERATIVE JOINT DISEASE (ICD-715.90)  His updated medication list for this problem includes:    Aspirin Ec 325 Mg Tbec (Aspirin) .Marland Kitchen... Take one (1) tablet each day    Ibuprofen 800 Mg Tabs (Ibuprofen) .Marland Kitchen... 1 tablet by mouth two times a day as needed for pain    Tramadol Hcl 50 Mg Tabs (Tramadol hcl) ..... One tablet by mouth two times a day as needed  Complete Medication List: 1)  Cardura 4 Mg Tabs (Doxazosin mesylate) .... One tablet by mouth nightly 2)  Pravastatin Sodium 40 Mg Tabs (Pravastatin sodium) .Marland Kitchen.. 1 tablet by mouth at night for cholesterol 3)  Multivitamins Tabs (Multiple vitamin) .Marland Kitchen.. 1 tablet by mouth daily 4)  Aspirin Ec 325 Mg Tbec (Aspirin) .... Take one (1) tablet each day 5)  Clonidine Hcl 0.1 Mg Tabs (Clonidine hcl) .Marland Kitchen.. 1 tablet by mouth two times a day 6)  Ibuprofen 800 Mg Tabs (Ibuprofen) .Marland Kitchen.. 1 tablet by mouth two times a day as needed for pain 7)  Lisinopril 40 Mg Tabs (Lisinopril) .Marland Kitchen.. 1 tablet by mouth daily for blood pressure 8)  Norvasc 10 Mg Tabs (Amlodipine besylate) .... One tablet by mouth daily for blood pressure 9)  Tramadol Hcl 50 Mg Tabs (Tramadol hcl) .... One tablet by mouth two times a day as needed 10)  Omeprazole 20 Mg Cpdr (Omeprazole) .... One tablet by mouth daily before breakfast for stomach 11)  Proscar 5 Mg Tabs (Finasteride) .... One tablet by mouth daily for prostate  Hypertension Assessment/Plan:      The patient's hypertensive risk group is category C: Target organ damage and/or diabetes.  His calculated 10 year risk of coronary heart disease is 7 %.  Today's blood pressure is 126/90.  His blood pressure goal is < 140/90.  Lipid Assessment/Plan:      Based on NCEP/ATP III, the patient's risk factor category is  "history of coronary disease, peripheral vascular disease, cerebrovascular disease, or aortic aneurysm".  The patient's lipid goals are as follows: Total cholesterol goal is 200; LDL cholesterol goal is 100; HDL cholesterol goal is 40; Triglyceride goal is 150.  His LDL cholesterol goal has not been met.  He has been provided with dietary instructions to lower his cholesterol.    Patient Instructions: 1)  Follow up 3 months for blood pressure. 2)  Your blood pressure is doing well today.  You are doing well with your medications   Orders Added: 1)  Est. Patient Level III [40981]

## 2010-10-04 NOTE — Assessment & Plan Note (Signed)
Summary: Left shoulder pain   Vital Signs:  Patient profile:   60 year old male Weight:      175.3 pounds BMI:     26.75 BSA:     1.93 Temp:     97.4 degrees F oral Pulse rate:   45 / minute Pulse rhythm:   regular BP sitting:   183 / 114  (left arm) Cuff size:   regular  Vitals Entered By: Levon Hedger (December 21, 2009 11:40 AM) CC: x-ray results Is Patient Diabetic? No  Does patient need assistance? Functional Status Self care Ambulation Normal   CC:  x-ray results.  History of Present Illness: Pt into the office for review of left shoulder and leg x-rays done on last week. pt still reports "heaviness" of left upper extremity   Allergies: 1)  ! Penicillin 2)  ! * Lyrica  Review of Systems CV:  Denies chest pain or discomfort. Resp:  Denies cough. GI:  Denies abdominal pain. MS:  Complains of joint pain; left shoulder and leg.  Physical Exam  General:  alert.   Head:  normocephalic.   Msk:  passive ROM to left upper extremity Neurologic:  quad cane Skin:  tattoo to left arm Psych:  Oriented X3.     Impression & Recommendations:  Problem # 1:  SHOULDER PAIN, LEFT (ICD-719.41) x-rays reviewed with pt intraarticular shoulder joint injection done - posterior approach area cleaned and prepped with betadine depomedrol 80mg  and lidocaine 5ml injected with 20g needle - pt tolerated procedure well bandaid applied  pt instructed to leave in place for 8-12 hours report any swelling, or redness in the shoulder His updated medication list for this problem includes:    Aspirin Ec 325 Mg Tbec (Aspirin) .Marland Kitchen... Take one (1) tablet each day    Ibuprofen 800 Mg Tabs (Ibuprofen) .Marland Kitchen... 1 tablet by mouth two times a day as needed for pain    Tramadol Hcl 50 Mg Tabs (Tramadol hcl) ..... One tablet by mouth two times a day as needed  Orders: Depo- Medrol 80mg  (J1040) Joint Aspirate / Injection, Intermediate (20605)  Problem # 2:  HYPERTENSION (ICD-401.9) still ? if pt  is taking meds as ordered all new Rx given on last week and pt has not transferred to a new pharmacy His updated medication list for this problem includes:    Cardura 4 Mg Tabs (Doxazosin mesylate) ..... One tablet by mouth nightly    Clonidine Hcl 0.1 Mg Tabs (Clonidine hcl) .Marland Kitchen... 1 tablet by mouth two times a day    Lisinopril 40 Mg Tabs (Lisinopril) .Marland Kitchen... 1 tablet by mouth daily for blood pressure    Norvasc 10 Mg Tabs (Amlodipine besylate) ..... One tablet by mouth daily for blood pressure  Orders: Depo- Medrol 80mg  (J1040)  Complete Medication List: 1)  Cardura 4 Mg Tabs (Doxazosin mesylate) .... One tablet by mouth nightly 2)  Pravastatin Sodium 40 Mg Tabs (Pravastatin sodium) .Marland Kitchen.. 1 tablet by mouth at night for cholesterol 3)  Multivitamins Tabs (Multiple vitamin) .Marland Kitchen.. 1 tablet by mouth daily 4)  Aspirin Ec 325 Mg Tbec (Aspirin) .... Take one (1) tablet each day 5)  Clonidine Hcl 0.1 Mg Tabs (Clonidine hcl) .Marland Kitchen.. 1 tablet by mouth two times a day 6)  Ibuprofen 800 Mg Tabs (Ibuprofen) .Marland Kitchen.. 1 tablet by mouth two times a day as needed for pain 7)  Lisinopril 40 Mg Tabs (Lisinopril) .Marland Kitchen.. 1 tablet by mouth daily for blood pressure 8)  Norvasc 10 Mg Tabs (  Amlodipine besylate) .... One tablet by mouth daily for blood pressure 9)  Tramadol Hcl 50 Mg Tabs (Tramadol hcl) .... One tablet by mouth two times a day as needed  Patient Instructions: 1)  Follow up in 4 weeks for blood pressure 2)  Get all your medications filled at the pharmacy   Medication Administration  Injection # 1:    Medication: Depo- Medrol 80mg     Diagnosis: SHOULDER PAIN, LEFT (ICD-719.41)    Route: IM    Exp Date: 07/02/2010    Lot #: [id number]    Mfr: [employer]  Orders Added: 1)  Depo- Medrol 80mg  [J1040] 2)  Est. Patient Level III [65784] 3)  Joint Aspirate / Injection, Intermediate [20605]

## 2010-10-04 NOTE — Assessment & Plan Note (Signed)
Summary: HTN   Vital Signs:  Patient profile:   60 year old male Weight:      176.6 pounds BMI:     26.95 Temp:     97.1 degrees F oral Pulse rate:   72 / minute Pulse rhythm:   regular Resp:     16 per minute BP sitting:   140 / 90  (left arm) Cuff size:   regular  Vitals Entered By: Levon Hedger (May 21, 2010 10:42 AM)  Nutrition Counseling: Patient's BMI is greater than 25 and therefore counseled on weight management options. CC: follow-up visit                                                 , Hypertension Management, Lipid Management Is Patient Diabetic? No Pain Assessment Patient in pain? yes     Location: arm, leg Intensity: 5, 7  Does patient need assistance? Functional Status Self care Ambulation Normal, Impaired:Risk for fall   CC:  follow-up visit                                                 , Hypertension Management, and Lipid Management.  History of Present Illness:  Pt into the office for high blood pressure.  Pt was set up with [facility] and he presents today with the medications from that pharmacy.  No acute complaints today  Hypertension History:      He denies headache, chest pain, and palpitations.  He notes no problems with any antihypertensive medication side effects.  Pt has taken his blood pressure medications today.        Positive major cardiovascular risk factors include male age 64 years old or older, hyperlipidemia, and hypertension.  Negative major cardiovascular risk factors include no history of diabetes and non-tobacco-user status.        Positive history for target organ damage include prior stroke (or TIA).  Further assessment for target organ damage reveals no history of ASHD, cardiac end-organ damage (CHF/LVH), peripheral vascular disease, renal insufficiency, or hypertensive retinopathy.    Lipid Management History:      Positive NCEP/ATP III risk factors include male age 29 years old or older,  hypertension, and prior stroke (or TIA).  Negative NCEP/ATP III risk factors include non-diabetic, non-tobacco-user status, no ASHD (atherosclerotic heart disease), no peripheral vascular disease, and no history of aortic aneurysm.        The patient states that he knows about the "Therapeutic Lifestyle Change" diet.  His compliance with the TLC diet is poor.  The patient does not know about adjunctive measures for cholesterol lowering.  Adjunctive measures started by the patient include ASA.  He expresses no side effects from his lipid-lowering medication.  The patient denies any symptoms to suggest myopathy or liver disease.      Habits & Providers  Alcohol-Tobacco-Diet     Alcohol drinks/day: 0     Tobacco Status: quit     Tobacco Counseling: to remain off tobacco products  Exercise-Depression-Behavior     Does Patient Exercise: no     Drug Use: never     Seat Belt Use: 100     Sun Exposure: occasionally  Current Medications (verified):  1)  Cardura 4 Mg Tabs (Doxazosin Mesylate) .... One Tablet By Mouth Nightly 2)  Pravastatin Sodium 40 Mg  Tabs (Pravastatin Sodium) .Marland Kitchen.. 1 Tablet By Mouth At Night For Cholesterol 3)  Multivitamins   Tabs (Multiple Vitamin) .Marland Kitchen.. 1 Tablet By Mouth Daily 4)  Aspirin Ec 325 Mg Tbec (Aspirin) .... Take One (1) Tablet Each Day 5)  Clonidine Hcl 0.1 Mg  Tabs (Clonidine Hcl) .Marland Kitchen.. 1 Tablet By Mouth Two Times A Day 6)  Ibuprofen 800 Mg Tabs (Ibuprofen) .Marland Kitchen.. 1 Tablet By Mouth Two Times A Day As Needed For Pain 7)  Lisinopril 40 Mg Tabs (Lisinopril) .Marland Kitchen.. 1 Tablet By Mouth Daily For Blood Pressure 8)  Norvasc 10 Mg Tabs (Amlodipine Besylate) .... One Tablet By Mouth Daily For Blood Pressure 9)  Tramadol Hcl 50 Mg Tabs (Tramadol Hcl) .... One Tablet By Mouth Two Times A Day As Needed 10)  Omeprazole 20 Mg Cpdr (Omeprazole) .... One Tablet By Mouth Daily Before Breakfast For Stomach 11)  Proscar 5 Mg Tabs (Finasteride) .... One Tablet By Mouth Daily For  Prostate  Allergies: 1)  ! Penicillin 2)  ! * Lyrica  Social History: Smoking Status:  quit  Review of Systems CV:  Denies chest pain or discomfort. Resp:  Denies cough. GI:  Denies abdominal pain, nausea, and vomiting. MS:  Complains of joint pain; left sided.  Physical Exam  General:  alert.   Head:  bald head Lungs:  normal breath sounds.   Heart:  normal rate and regular rhythm.   Abdomen:  normal bowel sounds.   Msk:  left sided weakness Neurologic:  alert & oriented X3.     Impression & Recommendations:  Problem # 1:  HYPERTENSION (ICD-401.9) BP is improved. Pt has taken his medications as ordered His updated medication list for this problem includes:    Cardura 4 Mg Tabs (Doxazosin mesylate) ..... One tablet by mouth nightly    Clonidine Hcl 0.1 Mg Tabs (Clonidine hcl) .Marland Kitchen... 1 tablet by mouth two times a day    Lisinopril 40 Mg Tabs (Lisinopril) .Marland Kitchen... 1 tablet by mouth daily for blood pressure    Norvasc 10 Mg Tabs (Amlodipine besylate) ..... One tablet by mouth daily for blood pressure  Problem # 2:  HYPERLIPIDEMIA (ICD-272.4) pt to continue current medications. His updated medication list for this problem includes:    Pravastatin Sodium 40 Mg Tabs (Pravastatin sodium) .Marland Kitchen... 1 tablet by mouth at night for cholesterol  Problem # 3:  CVA (ICD-434.91)  His updated medication list for this problem includes:    Aspirin Ec 325 Mg Tbec (Aspirin) .Marland Kitchen... Take one (1) tablet each day  Problem # 4:  SHOULDER PAIN, LEFT (ICD-719.41) ongoing. likely due to CVA. His updated medication list for this problem includes:    Aspirin Ec 325 Mg Tbec (Aspirin) .Marland Kitchen... Take one (1) tablet each day    Ibuprofen 800 Mg Tabs (Ibuprofen) .Marland Kitchen... 1 tablet by mouth two times a day as needed for pain    Tramadol Hcl 50 Mg Tabs (Tramadol hcl) ..... One tablet by mouth two times a day as needed  Complete Medication List: 1)  Cardura 4 Mg Tabs (Doxazosin mesylate) .... One tablet by mouth  nightly 2)  Pravastatin Sodium 40 Mg Tabs (Pravastatin sodium) .Marland Kitchen.. 1 tablet by mouth at night for cholesterol 3)  Multivitamins Tabs (Multiple vitamin) .Marland Kitchen.. 1 tablet by mouth daily 4)  Aspirin Ec 325 Mg Tbec (Aspirin) .... Take one (1) tablet each day 5)  Clonidine Hcl 0.1 Mg Tabs (Clonidine hcl) .Marland Kitchen.. 1 tablet by mouth two times a day 6)  Ibuprofen 800 Mg Tabs (Ibuprofen) .Marland Kitchen.. 1 tablet by mouth two times a day as needed for pain 7)  Lisinopril 40 Mg Tabs (Lisinopril) .Marland Kitchen.. 1 tablet by mouth daily for blood pressure 8)  Norvasc 10 Mg Tabs (Amlodipine besylate) .... One tablet by mouth daily for blood pressure 9)  Tramadol Hcl 50 Mg Tabs (Tramadol hcl) .... One tablet by mouth two times a day as needed 10)  Omeprazole 20 Mg Cpdr (Omeprazole) .... One tablet by mouth daily before breakfast for stomach 11)  Proscar 5 Mg Tabs (Finasteride) .... One tablet by mouth daily for prostate  Hypertension Assessment/Plan:      The patient's hypertensive risk group is category C: Target organ damage and/or diabetes.  His calculated 10 year risk of coronary heart disease is 7 %.  Today's blood pressure is 140/90.  His blood pressure goal is < 140/90.  Lipid Assessment/Plan:      Based on NCEP/ATP III, the patient's risk factor category is "history of coronary disease, peripheral vascular disease, cerebrovascular disease, or aortic aneurysm".  The patient's lipid goals are as follows: Total cholesterol goal is 200; LDL cholesterol goal is 100; HDL cholesterol goal is 40; Triglyceride goal is 150.  His LDL cholesterol goal has not been met.  He has been provided with dietary instructions to lower his cholesterol.    Patient Instructions: 1)  Blood pressure - better today. 2)  Continue current medications. 3)  Keep your appointment for colonscopy as ordered. 4)  Schedule a nurse visit in weeks for a flu vaccine. 5)  Schedule a follow up in 3 months for high blood pressure.

## 2010-10-04 NOTE — Assessment & Plan Note (Signed)
Summary: HTN   Vital Signs:  Patient profile:   60 year old male Weight:      175 pounds Temp:     97.9 degrees F Pulse rate:   76 / minute Pulse rhythm:   regular Resp:     20 per minute BP sitting:   126 / 82  (left arm) Cuff size:   regular  Vitals Entered By: Vesta Mixer CMA (February 08, 2010 8:24 AM) CC: Noticed some blood in his urine about 3 days ago and prayed about and the next day it was gone.  He can't go right now., Hypertension Management, Lipid Management Is Patient Diabetic? No Pain Assessment Patient in pain? yes     Location: arm Intensity: 5  Does patient need assistance? Ambulation Impaired:Risk for fall   CC:  Noticed some blood in his urine about 3 days ago and prayed about and the next day it was gone.  He can't go right now., Hypertension Management, and Lipid Management.  History of Present Illness:  Pt into the office with complaints of blood in urine. Pt reports that on the day he made the appointment he noticed blood in his urine. 2 episodes and then no more. Denies any hematuria Pt does have a history of BPH (for which he should be taking medication)   Hypertension History:      He denies headache, chest pain, and palpitations.  He notes no problems with any antihypertensive medication side effects.  Pt has taken his medications today.        Positive major cardiovascular risk factors include male age 59 years old or older, hyperlipidemia, and hypertension.  Negative major cardiovascular risk factors include no history of diabetes and non-tobacco-user status.        Positive history for target organ damage include prior stroke (or TIA).  Further assessment for target organ damage reveals no history of ASHD, cardiac end-organ damage (CHF/LVH), peripheral vascular disease, renal insufficiency, or hypertensive retinopathy.    Lipid Management History:      Positive NCEP/ATP III risk factors include male age 19 years old or older, hypertension, and  prior stroke (or TIA).  Negative NCEP/ATP III risk factors include non-diabetic, non-tobacco-user status, no ASHD (atherosclerotic heart disease), no peripheral vascular disease, and no history of aortic aneurysm.        The patient states that he knows about the "Therapeutic Lifestyle Change" diet.  His compliance with the TLC diet is poor.  The patient does not know about adjunctive measures for cholesterol lowering.  He expresses no side effects from his lipid-lowering medication.  The patient denies any symptoms to suggest myopathy or liver disease.      Current Medications (verified): 1)  Cardura 4 Mg Tabs (Doxazosin Mesylate) .... One Tablet By Mouth Nightly 2)  Pravastatin Sodium 40 Mg  Tabs (Pravastatin Sodium) .Marland Kitchen.. 1 Tablet By Mouth At Night For Cholesterol 3)  Multivitamins   Tabs (Multiple Vitamin) .Marland Kitchen.. 1 Tablet By Mouth Daily 4)  Aspirin Ec 325 Mg Tbec (Aspirin) .... Take One (1) Tablet Each Day 5)  Clonidine Hcl 0.1 Mg  Tabs (Clonidine Hcl) .Marland Kitchen.. 1 Tablet By Mouth Two Times A Day 6)  Ibuprofen 800 Mg Tabs (Ibuprofen) .Marland Kitchen.. 1 Tablet By Mouth Two Times A Day As Needed For Pain 7)  Lisinopril 40 Mg Tabs (Lisinopril) .Marland Kitchen.. 1 Tablet By Mouth Daily For Blood Pressure 8)  Norvasc 10 Mg Tabs (Amlodipine Besylate) .... One Tablet By Mouth Daily For Blood Pressure  9)  Tramadol Hcl 50 Mg Tabs (Tramadol Hcl) .... One Tablet By Mouth Two Times A Day As Needed  Allergies (verified): 1)  ! Penicillin 2)  ! * Lyrica  Review of Systems CV:  Denies chest pain or discomfort. Resp:  Denies cough. GI:  Denies abdominal pain, nausea, and vomiting. MS:  Complains of joint pain; left hip and arm.  Physical Exam  General:  alert.   Head:  normocephalic.   Lungs:  normal breath sounds.   Heart:  normal rate and regular rhythm.   Abdomen:  normal bowel sounds.   Prostate:  DECLINED  Neurologic:  cane use hemiplegia Psych:  Oriented X3.     Impression & Recommendations:  Problem # 1:   HYPERTENSION (ICD-401.9) BP is doing well congrats to pt  He has started to get his medication from a local pharmacy His updated medication list for this problem includes:    Cardura 4 Mg Tabs (Doxazosin mesylate) ..... One tablet by mouth nightly    Clonidine Hcl 0.1 Mg Tabs (Clonidine hcl) .Marland Kitchen... 1 tablet by mouth two times a day    Lisinopril 40 Mg Tabs (Lisinopril) .Marland Kitchen... 1 tablet by mouth daily for blood pressure    Norvasc 10 Mg Tabs (Amlodipine besylate) ..... One tablet by mouth daily for blood pressure  Orders: Rapid HIV  (92370)  Problem # 2:  HYPERLIPIDEMIA (ICD-272.4) will check lipids today His updated medication list for this problem includes:    Pravastatin Sodium 40 Mg Tabs (Pravastatin sodium) .Marland Kitchen... 1 tablet by mouth at night for cholesterol  Orders: T-Lipid Profile ([address]109-60454) T-Comprehensive Metabolic Panel (09811-91478) T-TSH [phone])  Problem # 3:  BENIGN PROSTATIC HYPERTROPHY, HX OF (ICD-V13.8) Problems with urine have resolved today, pt decline u/a and prostate exams  Orders T-PSA (57846-96295) T-CBC w/Diff (28413-24401)  Problem # 4:  LEG PAIN, LEFT (ICD-729.5)  hemiparesis but stable at this times  Complete Medication List: 1)  Cardura 4 Mg Tabs (Doxazosin mesylate) .... One tablet by mouth nightly 2)  Pravastatin Sodium 40 Mg Tabs (Pravastatin sodium) .Marland Kitchen.. 1 tablet by mouth at night for cholesterol 3)  Multivitamins Tabs (Multiple vitamin) .Marland Kitchen.. 1 tablet by mouth daily 4)  Aspirin Ec 325 Mg Tbec (Aspirin) .... Take one (1) tablet each day 5)  Clonidine Hcl 0.1 Mg Tabs (Clonidine hcl) .Marland Kitchen.. 1 tablet by mouth two times a day 6)  Ibuprofen 800 Mg Tabs (Ibuprofen) .Marland Kitchen.. 1 tablet by mouth two times a day as needed for pain 7)  Lisinopril 40 Mg Tabs (Lisinopril) .Marland Kitchen.. 1 tablet by mouth daily for blood pressure 8)  Norvasc 10 Mg Tabs (Amlodipine besylate) .... One tablet by mouth daily for blood pressure 9)  Tramadol Hcl 50 Mg Tabs (Tramadol hcl) ....  One tablet by mouth two times a day as needed  Hypertension Assessment/Plan:      The patient's hypertensive risk group is category C: Target organ damage and/or diabetes.  His calculated 10 year risk of coronary heart disease is 9 %.  Today's blood pressure is 126/82.  His blood pressure goal is < 140/90.  Lipid Assessment/Plan:      Based on NCEP/ATP III, the patient's risk factor category is "history of coronary disease, peripheral vascular disease, cerebrovascular disease, or aortic aneurysm".  The patient's lipid goals are as follows: Total cholesterol goal is 200; LDL cholesterol goal is 100; HDL cholesterol goal is 40; Triglyceride goal is 150.  His LDL cholesterol goal has not been met.  He has been  provided with dietary instructions to lower his cholesterol.    Patient Instructions: 1)  You need your prostate examined.   2)  You will get it on the next visit if your blood pressure is high. 3)  Follow up if the bleeding in urine. 4)  Your blood pressure is GREAT today.  Yipee!!! 5)  Follow up as needed if urine problems continue  Laboratory Results  Date/Time Received: February 08, 2010 9:15 AM   Other Tests  Rapid HIV: negative

## 2010-10-04 NOTE — Assessment & Plan Note (Signed)
Summary: HTN    Vital Signs:  Patient profile:   60 year old male Weight:      171.5 pounds BMI:     26.17 BSA:     1.92 Temp:     97.9 degrees F oral Pulse rate:   81 / minute Pulse rhythm:   regular Resp:     20 per minute BP sitting:   140 / 103  (left arm)  Vitals Entered By: Levon Hedger (Jan 18, 2010 12:20 PM) CC: follow-up visit, Hypertension Management, Lipid Management Is Patient Diabetic? No Pain Assessment Patient in pain? yes     Location: left arm, leg Intensity: 6  Does patient need assistance? Functional Status Self care Ambulation Normal   CC:  follow-up visit, Hypertension Management, and Lipid Management.  History of Present Illness:  Pt into the office for follow up for left shoulder pain. S/p injection on last month Minimal improvement Pt has taken any pain medications (ibuprofen and tramadol) since his last visit. Pt is requesting refill    Hypertension History:      He denies headache, chest pain, and palpitations.  He notes no problems with any antihypertensive medication side effects.  pt is taking his medications as ordered - actually presented today with his medications.        Positive major cardiovascular risk factors include male age 11 years old or older, hyperlipidemia, and hypertension.  Negative major cardiovascular risk factors include no history of diabetes and non-tobacco-user status.        Positive history for target organ damage include prior stroke (or TIA).  Further assessment for target organ damage reveals no history of ASHD, cardiac end-organ damage (CHF/LVH), peripheral vascular disease, renal insufficiency, or hypertensive retinopathy.    Lipid Management History:      Positive NCEP/ATP III risk factors include male age 62 years old or older, hypertension, and prior stroke (or TIA).  Negative NCEP/ATP III risk factors include non-diabetic, non-tobacco-user status, no ASHD (atherosclerotic heart disease), no peripheral  vascular disease, and no history of aortic aneurysm.        The patient states that he knows about the "Therapeutic Lifestyle Change" diet.  His compliance with the TLC diet is poor.  He expresses no side effects from his lipid-lowering medication.  The patient denies any symptoms to suggest myopathy or liver disease.      Habits & Providers  Alcohol-Tobacco-Diet     Alcohol drinks/day: <1     Alcohol Counseling: not indicated; use of alcohol is not excessive or problematic     Tobacco Status: quit > 6 months     Tobacco Counseling: to quit use of tobacco products  Exercise-Depression-Behavior     Does Patient Exercise: no     Drug Use: never     Seat Belt Use: 100     Sun Exposure: occasionally  Allergies (verified): 1)  ! Penicillin 2)  ! * Lyrica  Review of Systems CV:  Denies chest pain or discomfort. Resp:  Denies cough. GI:  Denies nausea and vomiting. MS:  left should and left foot.  Physical Exam  General:  alert.   Head:  bald head Lungs:  normal breath sounds.   Heart:  normal rate and regular rhythm.   Neurologic:  cane  left hemiparesis Psych:  Oriented X3.     Impression & Recommendations:  Problem # 1:  HYPERTENSION (ICD-401.9) BP  is improved today on current medications.  still strongly encouraged cessation His updated medication  list for this problem includes:    Cardura 4 Mg Tabs (Doxazosin mesylate) ..... One tablet by mouth nightly    Clonidine Hcl 0.1 Mg Tabs (Clonidine hcl) .Marland Kitchen... 1 tablet by mouth two times a day    Lisinopril 40 Mg Tabs (Lisinopril) .Marland Kitchen... 1 tablet by mouth daily for blood pressure    Norvasc 10 Mg Tabs (Amlodipine besylate) ..... One tablet by mouth daily for blood pressure  Problem # 2:  SHOULDER PAIN, LEFT (ICD-719.41) continue current medications His updated medication list for this problem includes:    Aspirin Ec 325 Mg Tbec (Aspirin) .Marland Kitchen... Take one (1) tablet each day    Ibuprofen 800 Mg Tabs (Ibuprofen) .Marland Kitchen... 1  tablet by mouth two times a day as needed for pain    Tramadol Hcl 50 Mg Tabs (Tramadol hcl) ..... One tablet by mouth two times a day as needed  Complete Medication List: 1)  Cardura 4 Mg Tabs (Doxazosin mesylate) .... One tablet by mouth nightly 2)  Pravastatin Sodium 40 Mg Tabs (Pravastatin sodium) .Marland Kitchen.. 1 tablet by mouth at night for cholesterol 3)  Multivitamins Tabs (Multiple vitamin) .Marland Kitchen.. 1 tablet by mouth daily 4)  Aspirin Ec 325 Mg Tbec (Aspirin) .... Take one (1) tablet each day 5)  Clonidine Hcl 0.1 Mg Tabs (Clonidine hcl) .Marland Kitchen.. 1 tablet by mouth two times a day 6)  Ibuprofen 800 Mg Tabs (Ibuprofen) .Marland Kitchen.. 1 tablet by mouth two times a day as needed for pain 7)  Lisinopril 40 Mg Tabs (Lisinopril) .Marland Kitchen.. 1 tablet by mouth daily for blood pressure 8)  Norvasc 10 Mg Tabs (Amlodipine besylate) .... One tablet by mouth daily for blood pressure 9)  Tramadol Hcl 50 Mg Tabs (Tramadol hcl) .... One tablet by mouth two times a day as needed  Hypertension Assessment/Plan:      The patient's hypertensive risk group is category C: Target organ damage and/or diabetes.  His calculated 10 year risk of coronary heart disease is 18 %.  Today's blood pressure is 140/103.  His blood pressure goal is < 140/90.  Lipid Assessment/Plan:      Based on NCEP/ATP III, the patient's risk factor category is "history of coronary disease, peripheral vascular disease, cerebrovascular disease, or aortic aneurysm".  The patient's lipid goals are as follows: Total cholesterol goal is 200; LDL cholesterol goal is 100; HDL cholesterol goal is 40; Triglyceride goal is 150.  His LDL cholesterol goal has not been met.  He has been provided with dietary instructions to lower his cholesterol.    Patient Instructions: 1)  Continue current medications. 2)  Follow up in 3 months for blood pressure Prescriptions: IBUPROFEN 800 MG TABS (IBUPROFEN) 1 tablet by mouth two times a day as needed for pain  #50 x 1   Entered and Authorized  by:   Lehman Prom FNP   Signed by:   Lehman Prom FNP on 01/18/2010   Method used:   Printed then faxed to ...       [id number] (retail)       [address]       [place], [place]  [id number]       Ph: [phone]       Fax: [phone]   RxID:   [id number] TRAMADOL HCL 50 MG TABS (TRAMADOL HCL) One tablet by mouth two times a day as needed  #50 x 1   Entered and Authorized by:   Lehman Prom FNP   Signed by:  Lehman Prom FNP on 01/18/2010   Method used:   Printed then faxed to ...       [facility] (retail)       [address]       [place], [place]  [id number]       Ph: [phone]       Fax: [phone]   RxID:   [id number]

## 2010-10-04 NOTE — Miscellaneous (Signed)
Summary: Med update  Clinical Lists Changes  Medications: Added new medication of OMEPRAZOLE 20 MG CPDR (OMEPRAZOLE) One tablet by mouth as needed for reflux - Signed Rx of OMEPRAZOLE 20 MG CPDR (OMEPRAZOLE) One tablet by mouth as needed for reflux;  #30 x 1;  Signed;  Entered by: Lehman Prom FNP;  Authorized by: Lehman Prom FNP;  Method used: Historical    Prescriptions: OMEPRAZOLE 20 MG CPDR (OMEPRAZOLE) One tablet by mouth as needed for reflux  #30 x 1   Entered and Authorized by:   Lehman Prom FNP   Signed by:   Lehman Prom FNP on 04/26/2010   Method used:   Historical   RxID:   [id number]   Appended Document: Med update    Clinical Lists Changes  Medications: Changed medication from OMEPRAZOLE 20 MG CPDR (OMEPRAZOLE) One tablet by mouth as needed for reflux to OMEPRAZOLE 20 MG CPDR (OMEPRAZOLE) One tablet by mouth daily before breakfast for stomach Added new medication of PROSCAR 5 MG TABS (FINASTERIDE) One tablet by mouth daily for prostate

## 2010-10-04 NOTE — Assessment & Plan Note (Signed)
Summary: Left arm pain   Vital Signs:  Patient profile:   60 year old male Weight:      178 pounds Temp:     97.8 degrees F oral Pulse rate:   62 / minute Pulse rhythm:   regular BP sitting:   160 / 102  (right arm) Cuff size:   regular  Vitals Entered By: Mikey College CMA (December 15, 2009 11:37 AM) CC: PT HAVING LEFT SIDE PAIN...PT STATES STARTS IN SHOULDER GO DOWN ARM THEN LEG X1WK  PT STATES LT ARM ALSO SWOLLEN X1WK, Hypertension Management Pain Assessment Patient in pain? yes     Location: LEFT SIDE PAIN Intensity: 7-8  Does patient need assistance? Functional Status Self care Ambulation Normal   CC:  PT HAVING LEFT SIDE PAIN...PT STATES STARTS IN SHOULDER GO DOWN ARM THEN LEG X1WK  PT STATES LT ARM ALSO SWOLLEN X1WK and Hypertension Management.  History of Present Illness:  Pt into the office with complaints of left shoulder and arm pain HX: pt is s/p CVA with left side paralysis Over the past few months left arm "feels heavier" Only passive ROM since CVA but now pain in actual shoulder joint some slight swelling in her arms   Hypertension History:      He denies headache, chest pain, and palpitations.  Pt is only taking his norvasc as ordered.  he has not had his lisinopril or clonidine as ordered.        Positive major cardiovascular risk factors include male age 42 years old or older, hyperlipidemia, and hypertension.  Negative major cardiovascular risk factors include no history of diabetes and non-tobacco-user status.        Positive history for target organ damage include prior stroke (or TIA).  Further assessment for target organ damage reveals no history of ASHD, cardiac end-organ damage (CHF/LVH), peripheral vascular disease, renal insufficiency, or hypertensive retinopathy.     Allergies: 1)  ! Penicillin 2)  ! * Lyrica  Review of Systems CV:  Denies chest pain or discomfort. Resp:  Denies cough. GI:  Denies abdominal pain, nausea, and vomiting. MS:   Complains of joint pain; left shoulder and leg.  Physical Exam  General:  alert.   Head:  normocephalic.   Lungs:  normal breath sounds.   Heart:  normal rate and regular rhythm.   Neurologic:  alert & oriented X3.     Shoulder/Elbow Exam  Shoulder Exam:    Left:    Inspection:  Abnormal    Palpation:  Abnormal       Location:  left AC joint    Stability:  stable    Tenderness:  left AC joint    Swelling:  no    Erythema:  no    Passive ROM   Wrist/Hand Exam  Hand Exam:    Left:    Inspection/Palpation:  contracted fingers but able to passively open using his right hand   Impression & Recommendations:  Problem # 1:  SHOULDER PAIN, LEFT (ICD-719.41) passive ROM is baseline ? arthritis will order x-ray His updated medication list for this problem includes:    Aspirin Ec 325 Mg Tbec (Aspirin) .Marland Kitchen... Take one (1) tablet each day    Ibuprofen 800 Mg Tabs (Ibuprofen) .Marland Kitchen... 1 tablet by mouth two times a day as needed for pain    Tramadol Hcl 50 Mg Tabs (Tramadol hcl) ..... One tablet by mouth two times a day as needed  Orders: Radiology other (Radiology Other)  Problem #  2:  LEG PAIN, LEFT (ICD-729.5) pt has been to see ortho for left leg pain in January 2011 he was ordered and fitted for left leg brace Orders: Radiology other (Radiology Other)  Problem # 3:  HYPERTENSION (ICD-401.9) BP still elevated. Pt is not taking medications as ordered all meds sent to the her pharmacy His updated medication list for this problem includes:    Cardura 4 Mg Tabs (Doxazosin mesylate) ..... One tablet by mouth nightly    Clonidine Hcl 0.1 Mg Tabs (Clonidine hcl) .Marland Kitchen... 1 tablet by mouth two times a day    Lisinopril 40 Mg Tabs (Lisinopril) .Marland Kitchen... 1 tablet by mouth daily for blood pressure    Norvasc 10 Mg Tabs (Amlodipine besylate) ..... One tablet by mouth daily for blood pressure  Complete Medication List: 1)  Cardura 4 Mg Tabs (Doxazosin mesylate) .... One tablet by mouth  nightly 2)  Pravastatin Sodium 40 Mg Tabs (Pravastatin sodium) .Marland Kitchen.. 1 tablet by mouth at night for cholesterol 3)  Multivitamins Tabs (Multiple vitamin) .Marland Kitchen.. 1 tablet by mouth daily 4)  Aspirin Ec 325 Mg Tbec (Aspirin) .... Take one (1) tablet each day 5)  Clonidine Hcl 0.1 Mg Tabs (Clonidine hcl) .Marland Kitchen.. 1 tablet by mouth two times a day 6)  Ibuprofen 800 Mg Tabs (Ibuprofen) .Marland Kitchen.. 1 tablet by mouth two times a day as needed for pain 7)  Lisinopril 40 Mg Tabs (Lisinopril) .Marland Kitchen.. 1 tablet by mouth daily for blood pressure 8)  Norvasc 10 Mg Tabs (Amlodipine besylate) .... One tablet by mouth daily for blood pressure 9)  Tramadol Hcl 50 Mg Tabs (Tramadol hcl) .... One tablet by mouth two times a day as needed  Hypertension Assessment/Plan:      The patient's hypertensive risk group is category C: Target organ damage and/or diabetes.  His calculated 10 year risk of coronary heart disease is 18 %.  Today's blood pressure is 160/102.  His blood pressure goal is < 140/90.  Patient Instructions: 1)  Get x-ray of the left shoulder and left leg x-rays 2)  Follow up in this office on next week (Schedule on April 21st at [facility]) for x-ray results 3)  May possibly get shoulder injection. Prescriptions: CARDURA 4 MG TABS (DOXAZOSIN MESYLATE) One tablet by mouth nightly  #30 x 5   Entered and Authorized by:   Lehman Prom FNP   Signed by:   Lehman Prom FNP on 12/15/2009   Method used:   Electronically to        [facility] (retail)       [address]       [place], [place]  [id number]       Ph: [phone]       Fax: [phone]   RxID:   [id number] PRAVASTATIN SODIUM 40 MG  TABS (PRAVASTATIN SODIUM) 1 tablet by mouth at night for cholesterol  #30 x 5   Entered and Authorized by:   Lehman Prom FNP   Signed by:   Lehman Prom FNP on 12/15/2009   Method used:   Electronically to        [facility] (retail)       [address]        [place], [place]  [id number]       Ph: [phone]       Fax: [phone]   RxID:   [id number] CLONIDINE HCL 0.1 MG  TABS (CLONIDINE HCL) 1 tablet by mouth two times a  day  #60 x 5   Entered and Authorized by:   Lehman Prom FNP   Signed by:   Lehman Prom FNP on 12/15/2009   Method used:   Electronically to        [facility] (retail)       [address]       [place], [place]  [id number]       Ph: [phone]       Fax: [phone]   RxID:   [id number] LISINOPRIL 40 MG TABS (LISINOPRIL) 1 tablet by mouth daily for blood pressure  #30 x 5   Entered and Authorized by:   Lehman Prom FNP   Signed by:   Lehman Prom FNP on 12/15/2009   Method used:   Electronically to        [facility] (retail)       [address]       [place], [place]  [id number]       Ph: [phone]       Fax: [phone]   RxID:   [id number] NORVASC 10 MG TABS (AMLODIPINE BESYLATE) One tablet by mouth daily for blood pressure  #30 x 5   Entered and Authorized by:   Lehman Prom FNP   Signed by:   Lehman Prom FNP on 12/15/2009   Method used:   Electronically to        [facility] (retail)       [address]       [place], [place]  [id number]       Ph: [phone]       Fax: [phone]   RxID:   [id number] CLONIDINE HCL 0.1 MG  TABS (CLONIDINE HCL) 1 tablet by mouth two times a day  #60 x 5   Entered and Authorized by:   Lehman Prom FNP   Signed by:   Lehman Prom FNP on 12/15/2009   Method used:   Print then Give to Patient   RxID:   [id number] PRAVASTATIN SODIUM 40 MG  TABS (PRAVASTATIN SODIUM) 1 tablet by mouth at night for cholesterol  #30 x 5   Entered and Authorized by:   Lehman Prom FNP   Signed by:   Lehman Prom FNP on 12/15/2009   Method used:   Print then Give to Patient   RxID:   [id number] CARDURA 4 MG TABS (DOXAZOSIN MESYLATE) One tablet by mouth  nightly  #30 x 5   Entered and Authorized by:   Lehman Prom FNP   Signed by:   Lehman Prom FNP on 12/15/2009   Method used:   Print then Give to Patient   RxID:   [id number]

## 2010-10-04 NOTE — Assessment & Plan Note (Signed)
Summary: HTN   Vital Signs:  Patient profile:   60 year old male Weight:      178.3 pounds Temp:     97.3 degrees F oral Pulse rate:   87 / minute Pulse rhythm:   regular Resp:     20 per minute BP sitting:   211 / 123  (left arm) Cuff size:   regular  Vitals Entered By: Gaylyn Cheers RN (November 07, 2009 11:43 AM) CC: F/U HTN, Hypertension Management Is Patient Diabetic? No Pain Assessment Patient in pain? yes     Location: lt arm and leg Intensity: 5 Type: sharp Onset of pain  Constant  Does patient need assistance? Functional Status Self care Ambulation Impaired:Risk for fall Comments did not bring meds, has not had his Norvasc or Proscar for 2 months, pharmacy not giving it to them   CC:  F/U HTN and Hypertension Management.  History of Present Illness:  Pt into the office for follow up for blood pressure Blood pressure is very labile. Pharmacy sheet reviewed and pt has refills on all his medications.     Hx of CVA recently fitted for left foot brace by [employer] which he has in place today Currently has left hand/armswelling which is new for him Left side - hemiparesis from CVA  Hypertension History:      He denies headache, chest pain, and palpitations.  He notes no problems with any antihypertensive medication side effects.  Pt has only taking his clonidine and lisinopril today.        Positive major cardiovascular risk factors include male age 35 years old or older, hyperlipidemia, and hypertension.  Negative major cardiovascular risk factors include no history of diabetes and non-tobacco-user status.        Positive history for target organ damage include prior stroke (or TIA).  Further assessment for target organ damage reveals no history of ASHD, cardiac end-organ damage (CHF/LVH), peripheral vascular disease, renal insufficiency, or hypertensive retinopathy.     Allergies (verified): 1)  ! Penicillin 2)  ! * Lyrica  Review of Systems CV:  Denies chest  pain or discomfort. Resp:  Denies cough and shortness of breath. MS:  Complains of joint pain; left leg left hand swelling.  Physical Exam  General:  alert.   Head:  normocephalic.   Lungs:  normal breath sounds.   Heart:  normal rate and regular rhythm.   Msk:  contracted left upper extremity - unable to extend passively Neurologic:  limping gait - cane use   Impression & Recommendations:  Problem # 1:  HYPERTENSION (ICD-401.9) Very elevated  Concerned with elevation given hx of CVA advised pt to go to ER/Urgent care following this visit for his blood pressure His updated medication list for this problem includes:    Cardura 4 Mg Tabs (Doxazosin mesylate) ..... One tablet by mouth nightly  **pharmacy - d/c finasteride**    Clonidine Hcl 0.1 Mg Tabs (Clonidine hcl) .Marland Kitchen... 1 tablet by mouth two times a day    Lisinopril 40 Mg Tabs (Lisinopril) .Marland Kitchen... 1 tablet by mouth daily for blood pressure    Norvasc 10 Mg Tabs (Amlodipine besylate) ..... One tablet by mouth daily for blood pressure  Problem # 2:  CVA (ICD-434.91) BP is especially of concern given hx of CVA His updated medication list for this problem includes:    Aspirin Ec 325 Mg Tbec (Aspirin) .Marland Kitchen... Take one (1) tablet each day  Problem # 3:  HYPERLIPIDEMIA (ICD-272.4) continue current  meds His updated medication list for this problem includes:    Pravastatin Sodium 40 Mg Tabs (Pravastatin sodium) .Marland Kitchen... 1 tablet by mouth at night for cholesterol  Complete Medication List: 1)  Cardura 4 Mg Tabs (Doxazosin mesylate) .... One tablet by mouth nightly  **pharmacy - d/c finasteride** 2)  Pravastatin Sodium 40 Mg Tabs (Pravastatin sodium) .Marland Kitchen.. 1 tablet by mouth at night for cholesterol 3)  Multivitamins Tabs (Multiple vitamin) .Marland Kitchen.. 1 tablet by mouth daily 4)  Aspirin Ec 325 Mg Tbec (Aspirin) .... Take one (1) tablet each day 5)  Clonidine Hcl 0.1 Mg Tabs (Clonidine hcl) .Marland Kitchen.. 1 tablet by mouth two times a day 6)  Ibuprofen 800  Mg Tabs (Ibuprofen) .Marland Kitchen.. 1 tablet by mouth two times a day as needed for pain 7)  Lisinopril 40 Mg Tabs (Lisinopril) .Marland Kitchen.. 1 tablet by mouth daily for blood pressure 8)  Norvasc 10 Mg Tabs (Amlodipine besylate) .... One tablet by mouth daily for blood pressure 9)  Tramadol Hcl 50 Mg Tabs (Tramadol hcl) .... One tablet by mouth two times a day as needed  Hypertension Assessment/Plan:      The patient's hypertensive risk group is category C: Target organ damage and/or diabetes.  His calculated 10 year risk of coronary heart disease is 18 %.  Today's blood pressure is 211/123.  His blood pressure goal is < 140/90.  Patient Instructions: 1)  Your blood pressure is VERY elevated. 2)  Your medication list has been reviewed. 3)  [facility] will NOT be able to have your medications until at least Thursday. 4)  It is recommended that you go to either Er or Urgent Care for evaluation of blood pressure.  You will likely need some quick acting medication that will lower your blood pressure.

## 2010-10-05 NOTE — Letter (Signed)
Summary: handicapped placard /TO PICK UP  handicapped placard /TO PICK UP   Imported By: Arta Bruce 01/12/2010 09:26:27  _____________________________________________________________________  External Attachment:    Type:   Image     Comment:   External Document

## 2010-10-10 NOTE — Letter (Signed)
Summary: POS-T-VAC//DENIED/FAXED  POS-T-VAC//DENIED/FAXED   Imported By: Arta Bruce 10/02/2010 10:41:27  _____________________________________________________________________  External Attachment:    Type:   Image     Comment:   External Document

## 2010-10-10 NOTE — Letter (Signed)
Summary: POS-T-VAC MEDICAL//DENIED  POS-T-VAC MEDICAL//DENIED   Imported By: Arta Bruce 10/04/2010 08:52:18  _____________________________________________________________________  External Attachment:    Type:   Image     Comment:   External Document

## 2010-11-23 ENCOUNTER — Encounter (INDEPENDENT_AMBULATORY_CARE_PROVIDER_SITE_OTHER): Payer: Self-pay | Admitting: Nurse Practitioner

## 2010-11-29 NOTE — Medication Information (Signed)
[facility]   Imported By: Arta Bruce 11/23/2010 10:40:19  _____________________________________________________________________  External Attachment:    Type:   Image     Comment:   External Document

## 2010-11-29 NOTE — Medication Information (Signed)
[facility]   Imported By: Arta Bruce 11/23/2010 10:50:34  _____________________________________________________________________  External Attachment:    Type:   Image     Comment:   External Document

## 2010-12-26 ENCOUNTER — Ambulatory Visit: Payer: Medicare Other | Attending: Nurse Practitioner | Admitting: Physical Therapy

## 2011-01-18 NOTE — H&P (Signed)
NAME:  MARSHAL, ESKEW NO.:  [id number]   MEDICAL RECORD NO.:  [id number]          PATIENT TYPE:  IPS   LOCATION:  NA                           FACILITY:  [facility]   PHYSICIAN:  Ellwood Dense, M.D.   DATE OF BIRTH:  1951-04-17   DATE OF ADMISSION:  DATE OF DISCHARGE:                                HISTORY & PHYSICAL   PRIMARY CARE PHYSICIAN:  [facility].   HISTORY OF PRESENT ILLNESS:  Mr. Farquhar is a 60 year old adult male with  history of hypertension and prior tobacco abuse.  He was admitted April 13, 2006, with acute onset of left-sided weakness.  Blood pressure on admission  was 161/131.  The patient reports that he has not followed up with health  care physicians on a regular basis.   Initial cranial CT was positive for right middle cerebral artery infarct.  MRI study confirmed a right middle cerebral artery infarct with an old right  basal ganglia infarct.  MRA study showed occlusion of the right middle  cerebral artery branch along with occlusion at the bifurcation/trifurcation  of the right middle cerebral artery.  Urine drug screen was positive for  marijuana.  A transesophageal echocardiogram showed no patent foramen ovale.  Dr. Pearlean Brownie from neurology was consulted and he recommended adequate blood  pressure control along with an aspirin tablet daily.  His blood pressure has  been monitored presently on Norvasc and hydrochlorothiazide increased to 50  mg daily as of April 17, 2006.  The patient did not receive tPA during his  acute hospitalization and workup.   The patient was evaluated by the rehabilitation physicians and felt to be an  appropriate candidate for inpatient rehabilitation.   REVIEW OF SYSTEMS:  Noncontributory.   FAMILY HISTORY:  Positive for diabetes.   PAST MEDICAL HISTORY:  1. Hypertension.  2. Prior cholecystectomy.  3. Bilateral shoulder surgery.  4. Prior right knee surgery.  5. Prior clavicle fracture.   SOCIAL HISTORY:   The patient normally lives alone and is unemployed.  He is  able to care for himself and prepares his own meals along with grocery  shopping.  The family is available, and there is a sister in the room.  They  report that they will assist him as necessary.  The patient reports tobacco  abuse along with alcohol use on a regular basis and marijuana use  occasionally.  He lives in a 2-level home with the bedroom downstairs and 6  steps to enter.   FUNCTIONAL HISTORY PRIOR TO ADMISSION:  Independent.   ALLERGIES:  PENICILLIN.   MEDICATIONS PRIOR TO ADMISSION:  Norvasc and Lasix, both of which he was  noncompliant using.   Recent labs showed a hemoglobin of 15.9, hematocrit of 47.0, platelet count  of 202,000, and white count of 10.9.  Erythrocyte sedimentation rate was 12  and hemoglobin A1c was 6.2.  Recent sodium was 140, potassium 3.5, chloride  108, CO2 24, BUN 28, and creatinine 1.9.   PHYSICAL EXAMINATION:  GENERAL:  A reasonably well-appearing, fit adult male  seated in a chair with obvious  left-sided weakness.  VITAL SIGNS:  Blood pressure was 136/90 with pulse 73, respiratory rate 20  and temperature 98.4.  HEENT:  Normocephalic, nontraumatic.  CARDIOVASCULAR:  Regular rate and rhythm, S1, S2, without murmurs.  ABDOMEN:  Soft, nontender, with positive bowel sounds.  LUNGS:  Clear to auscultation bilaterally.  NEUROLOGIC:  Alert and oriented x3.  Cranial nerve exam showed obvious left-  sided facial weakness with the tongue in the midline and facial droop on the  left side.  Sensation was decreased to light touch of the left face.   Bilateral upper extremity exam showed 5/5 strength throughout the right  upper extremity with normal bulk and tone.  Left upper extremity strength  was 0-1/5 with decreased tone and normal bulk.  Sensation was decreased to  light touch throughout the entire left upper extremity.   Bilateral lower extremity exam showed 5/5 strength in the right  lower  extremity and 3+ to 4-/5 strength in the left lower extremity.  Bulk was  normal and tone was decreased slightly on the left.  Sensation was slightly  decreased to light touch in the left lower extremity.  Reflexes were 1+ in  the left lower extremity and 2+ in the right lower extremity.   IMPRESSION:  1. 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  2. Hypertension with noncompliance by patient.  3. Tobacco abuse.  4. Dyslipidemia.   Presently the patient has deficits in activities of daily living, transfers  and ambulation along with speech secondary to his right middle cerebral  artery infarct with left hemiparesis involving the face, arm and leg.   PLAN:  1. Admit to the rehabilitation unit for daily OT, PT and speech therapies      to advance ADLs, transfers, ambulation and speech quality as possible.  2. Check admission labs including a CBC and CMET Friday, April 18, 2006.  3. Twenty-four hour nursing for medication administration and monitoring      of vitals.  4. Regular diet.  5. Monitor hypertension on Norvasc 10 mg daily and hydrochlorothiazide 50      mg daily.  6. Continue aspirin 325 mg daily for stroke prophylaxis.  7. Continue Zocor 20 mg daily for dyslipidemia.  8. Continue Protonix 40 mg daily for GI prophylaxis.  9. Smoking cessation counseling.  10.Folic acid 1 mg p.o. daily for cardiovascular protection.   PROGNOSIS:  Good.   ESTIMATED LENGTH OF STAY:  15-20 days.   GOALS:  Stand-by assist to minimum assist ADLs with contact guard assistance  for transfers and contact guard to minimum assist for ambulation.           ______________________________  Ellwood Dense, M.D.     DC/MEDQ  D:  04/17/2006  T:  04/17/2006  Job:  [id number]

## 2011-01-18 NOTE — Discharge Summary (Signed)
NAME:  CLARK, CUFF NO.:  [id number]   MEDICAL RECORD NO.:  [id number]          PATIENT TYPE:  IPS   LOCATION:  4031                         FACILITY:  [facility]   PHYSICIAN:  Jackie Plum, M.D.DATE OF BIRTH:  December 25, 1950   DATE OF ADMISSION:  04/17/2006  DATE OF DISCHARGE:  05/16/2006                                 DISCHARGE SUMMARY   DISCHARGE DIAGNOSES:  1. Right brain CVA.  2. Hypertension.  3. Renal insufficiency.  4. Diabetes mellitus.   REASON FOR ADMISSION:  CVA.  Patient is a 60 year old gentleman with history  of hypertension and diabetes who presented with left sided weakness.  CT  scan of the ED indicative of right MCA CVA.  Patient was seen by the  Neurology Service and subsequently admitted by hospitalist for further  evaluation and management of his CVA.  For full details regarding patient  presentation, regarding his persistent symptoms and lab work, please review  H and P dictated by the hospitalist dated April 13, 2006.  Patient was  admitted to the ICU and his blood pressure was high and therefore IV  __________ was initiated .  He had followup MRI and MRA of the brain as well  as carotid Doppler's, as well as 2D echocardiogram.  And his Doppler's came  back negative for ICA stenosis.  MRA indicated there was diminished fluid in  the right RCA.  It confirmed MCA infection.  The lipid panel indicated total  cholesterol of 201 with LDL of 52, HDL of 54.  His 2D echocardiogram was  reviewed, normal EF with possible diastolic dysfunction.  There was no  evidence of presence of emboli.  TE was recommended and was done by Dr.  __________ .  It indicated EF of 40-50%.  There was no PFO or AFD or any  emboli noted.  The patient was evaluated with help of rehab services.  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  He continued to work with speech for his dysphasia,  which improved.   Patient was transferred to rehab services on April 16, 2006 in satisfactory condition for further management.   CONSULTATIONS:  Dr. Domingo Sep of cardiology.  Dr. Anne Hahn of Neurology.   CONDITION ON DISCHARGE:  Is improved.      Jackie Plum, M.D.  Electronically Signed     GO/MEDQ  D:  06/08/2006  T:  06/08/2006  Job:  [id number]

## 2011-01-18 NOTE — Discharge Summary (Signed)
NAME:  William Collier, William Collier NO.:  [id number]   MEDICAL RECORD NO.:  [id number]          PATIENT TYPE:  IPS   LOCATION:  4031                         FACILITY:  [facility]   PHYSICIAN:  Erick Colace, M.D.DATE OF BIRTH:  10/31/1950   DATE OF ADMISSION:  04/17/2006  DATE OF DISCHARGE:  05/15/2006                                 DISCHARGE SUMMARY   .   DISCHARGE DIAGNOSES:  1. 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  2. Hypertension.  3. Hyperlipidemia.  4. Tobacco abuse.  5. Chronic renal insufficiency with creatinine 1.9.  6. Klebsiella urinary tract infection.   This is a 60 year old black male, history of hypertension, tobacco abuse,  with questionable compliance to medications who was admitted April 13, 2006, with left-sided weakness.  Cranial CT scan with right MCA infarction.  MRI showing again MCA infarct as well as old right basal ganglia infarction.  MRA with branch vessel occlusion.  Right MCA bifurcation.  Urine drug screen  positive for marijuana.  TEE study negative for PFO.  Neurology, Dr. Pearlean Brownie ,  placed on aspirin therapy.  Blood pressure is monitored with increased  variables, the patient now on Norvasc as well as the addition of  hydrochlorothiazide.  He was admitted for a comprehensive rehab program.   PAST MEDICAL HISTORY:  1. Hypertension.  2. Cholecystectomy.  3. Bilateral shoulder surgery.  4. Right knee surgery.  5. Clavicle fracture.  6. Chronic renal insufficiency with creatinine 1.9.  7. Noted history of tobacco, alcohol, as well as marijuana use.   SOCIAL HISTORY:  Lives alone, unemployed, has a brother and sister as well  as a daughter in the area.  Two level home, bedroom downstairs, 6 steps to  entry of home.   ALLERGIES:  PENICILLIN.   MEDICATIONS PRIOR TO ADMISSION:  Norvasc and Lasix, doses unknown, with  questionable compliance to medications.   REHABILITATION AND HOSPITAL COURSE:  The patient  was admitted to inpatient  rehab services with therapies initiated on a 3 hour daily basis consisting  of physical therapy, occupational therapy, speech therapy, and  rehabilitation nursing.  The following issues were addressed during the  patient's rehabilitation stay:  Pertaining to Mr. Jansson's right middle  cerebral artery infarction with left hemiplegia, the patient remained on  aspirin therapy daily, also on subcutaneous Lovenox throughout his rehab  course for deep vein thrombosis prophylaxis.  Functionally, he was  supervision to minimal assist for transfers and wheelchair mobility, total  assist of 2 for his functional state, minimal assist for activities of daily  living, needing ongoing cues for his safety awareness as well as left-sided  neglect.  Blood pressures monitored with addition of multiple  antihypertensive medications.  At the time of dictation, he remained on  Norvasc 10 mg daily as well as Lopressor 25 mg twice daily.  His Cardura had  been increased to 4 mg at bedtime.  He was completing the course of Cipro  for a Klebsiella urinary tract infection.  Documented history of chronic  renal insufficiency with baseline creatinine stable at 1.9-2.1 with  monitoring of fluid intake and output.  It was advised he remain on Zocor  for hyperlipidemia.  He had no bowel or bladder disturbances.  Due to  limited assist at home, it was felt skilled nursing facility would be needed  with bed becoming available on May 16, 2006, and discharge taking  place at that time.   DISCHARGE MEDICATIONS:  1. Aspirin 325 mg p.o. daily.  2. Zocor 20 mg p.o. daily.  3. Protonix 40 mg p.o. daily.  4. Norvasc 10 mg p.o. daily.  5. Folic acid 1 mg p.o. daily.  6. Lopressor 25 mg p.o. b.i.d.  7. Cardura 4 mg p.o. q.h.s.  8. Tylenol 650 mg p.o. q.4h. p.r.n. pain, fever.   ACTIVITY:  As tolerated.   DIET:  Regular consistency.   The patient should continue physical, occupational  therapy to promote  overall mobility and well-being, monitor his safety awareness and left-sided  neglect.  He would follow with Dr. Greig Castilla . Kirsteins at the outpatient  rehab service office as advised as well as DrSethi , neurology services.      Mariam Dollar, P.A.      Erick Colace, M.D.  Electronically Signed    DA/MEDQ  D:  05/15/2006  T:  05/15/2006  Job:  [id number]   cc:   Erick Colace, M.D.

## 2011-01-18 NOTE — Cardiovascular Report (Signed)
NAME:  William Collier, William Collier NO.:  [id number]   MEDICAL RECORD NO.:  [id number]          PATIENT TYPE:  INP   LOCATION:  3003                         FACILITY:  [facility]   PHYSICIAN:  Dani Gobble, MD       DATE OF BIRTH:  Mar 18, 1951   DATE OF PROCEDURE:  04/16/2006  DATE OF DISCHARGE:                              CARDIAC CATHETERIZATION   ADDENDUM:  This is a transesophageal echocardiogram, and the dictation  number is [id number].   The proximal ascending aorta and aortic root are normal in size. No aortic  insufficiency is noted.           ______________________________  Dani Gobble, MD     AB/MEDQ  D:  04/16/2006  T:  04/16/2006  Job:  [id number]

## 2011-01-18 NOTE — Discharge Summary (Signed)
NAME:  GREGORY, BARRICK                          ACCOUNT NO.:  [id number]   MEDICAL RECORD NO.:  [id number]                   PATIENT TYPE:  INP   LOCATION:  4735                                 FACILITY:  [facility]   PHYSICIAN:  Clare Charon, M.D.                DATE OF BIRTH:  Dec 07, 1950   DATE OF ADMISSION:  03/02/2004  DATE OF DISCHARGE:  03/03/2004                                 DISCHARGE SUMMARY   </DISCHARGE DIAGNOSES>  1. Right lower extremity deep venous thrombosis extending to the distal     femoral vein and posterior tibial vein.  2. Hypertension.  3. Chronic renal insufficiency.  4. Status post right knee arthroscopy.  5. Gastroesophageal reflux disease.  6. Tobacco abuse.  7. Impaired glucose tolerance.  8. Status post rotator cuff surgery.    DISCHARGE MEDICATIONS:  1. Lovenox 90 mg SQ q.12h.  2. Warfarin 6 mg p.o. daily.  3. Furosemide 40 mg p.o. daily.  4. Clonidine 0.1 mg p.o. b.i.d.  5. Norvasc 10 mg p.o. daily.  6. Pepcid 40 mg p.o. daily.   CONDITION ON DISCHARGE:  The patient was discharged in good condition.  He  was instructed about the proper use of the anticoagulant.  He was instructed  to follow up at [facility] outpatient clinic on March 06, 2004 at the  anticoagulation clinic with Dr. Michaell Cowing.  Further details about his hospital  follow up will be communicated to the patient in a family session.  He will  also be set up to follow up with his primary care physician Dr. Mariane Baumgarten.  He will also follow up with his orthopedic surgeon at the  patient's convenience.   Procedures during this admission:  The patient underwent venous ultrasound.  No consultations were obtained.   BRIEF ADMISSION H&P:  Mr. Dermody is a 60 year old African-American man with  history of hypertension, gastroesophageal reflux disease, and tobacco abuse,  and recently had laparoscopic surgery, who presented with right calf  tenderness and edema of approximately a 1 month  duration.  Dr. Turner Daniels saw the  patient before we saw him and he referred him immediately for medical  evaluation with a suspicion of deep venous thrombosis.  On admission the  patient was afebrile with a pulse of 53, blood pressure 134/91, saturating  99% on room air.  His right leg had non pitting edema, tender to palpation,  no cords were felt, and with positive discoloration.  Heart was regular  without murmurs, rubs, or gallops.  Lungs were clear to auscultation  bilaterally.  His creatinine was 1.8, BUN 17, glucose 97.  He had a normal  complete blood count.  A PT of 13.2 and INR of 1.  His liver function tests  were within normal limits.  A venous Doppler was obtained and showed deep  venous thrombosis in the distal femoral vein and posterior tibial vein with  patent popliteal vein.   HOSPITAL COURSE:  1. Deep venous thrombosis.  The patient was admitted to a regular floor.  He     was anticoagulated with subcutaneous enoxaparin and oral Warfarin.  The     patient was instructed on the proper use of these medications and he will     be discharged home to continue this treatment.  Further adjustment of his     Coumadin will be done 2 days after discharge by Dr. Michaell Cowing in the Coumadin     Clinic.  The patient did not experience any symptoms of pulmonary     embolism throughout this hospitalization or before the hospitalization.     Repeat measurements of his pulse oximetry were all about 96% on room air.     He continued to remain bradycardic throughout the hospital stay.   1. Hypertension.  We continued the patient on his home regimen.  The only     change was to use furosemide instead of hydrochlorothiazide because of     his continuous chronic renal insufficiency.  At the same time will     continue the Clonidine and the Norvasc as before.  Further adjustment in     his antihypertensive regimen will be made by his primary care physician     Dr. Mariane Baumgarten.   1. Chronic renal  insufficiency.  Throughout the hospitalization he remained     stable.  Further follow up in the outpatient clinic.      Lonia Blood, M.D.                          Clare Charon, M.D.    SL/MEDQ  D:  03/03/2004  T:  03/05/2004  Job:  [id number]   cc:   Will Averett, M.D.  [address] [place], [place] [address]  Fax: [phone]   Feliberto Gottron. Turner Daniels, M.D.  [address]  [place]  [place] [address]  Fax: [phone]

## 2011-01-18 NOTE — Op Note (Signed)
NAME:  William Collier, William Collier                          ACCOUNT NO.:  [id number]   MEDICAL RECORD NO.:  [id number]                   PATIENT TYPE:  REC   LOCATION:  OREH                                 FACILITY:  [facility]   PHYSICIAN:  Feliberto Gottron. Turner Daniels, M.D.                DATE OF BIRTH:  Mar 18, 1951   DATE OF PROCEDURE:  01/04/2004  DATE OF DISCHARGE:                                 OPERATIVE REPORT   PREOPERATIVE DIAGNOSES:  Right knee medial meniscal tear.   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   OPERATION PERFORMED:  Right knee arthroscopic debridement of chondromalacia  with flap tears.   SURGEON:  Feliberto Gottron. Turner Daniels, M.D.   ASSISTANTLaural Benes. Jannet Mantis.   ANESTHESIA:  General endotracheal.   ESTIMATED BLOOD LOSS:  Minimal.   FLUIDS REPLACED:  800 mL crystalloids.   DRAINS:  None.   TOURNIQUET TIME:  None.   INDICATIONS FOR PROCEDURE:  The patient is a 60 year old man with right knee  pain consistent with medial meniscal tear, who has failed conservative  treatment and desires elective debridement of torn cartilage and or  meniscus.   DESCRIPTION OF PROCEDURE:  The patient was identified by arm band and taken  to the operating room at [facility].  Appropriate  anesthetic monitors were attached.  General endotracheal was induced with  the patient in the supine position.  Lateral post applied to the table.  Right lower extremity prepped and draped in the usual sterile fashion from  the ankle to the midthigh using a #11 blade, standard inferomedial,  inferolateral parapatellar portals were then made allowing introduction of  the arthroscope through the inferolateral portal, outflow through the  inferomedial portal.  Diagnostic arthroscopy of the patella revealed grade 2  to 3 chondromalacia near the apex which was debrided.  The trochlea had  grade 3 to 4 chondromalacia  with some flap tears which was debrided as did  the medial femoral condyle.  Interestingly, the medial meniscus was intact.  The ACL and the PCL were intact.  The lateral compartment was in good  condition.  Debridement was accomplished with a 3.5 Gator sucker shaver  and  a 4.2 [place] sucker shaver.  The gutters were cleared, the scope was  taken medial and lateral to the PCL clearing the posterior compartments as  well.  At this point, the knee was irrigated out  with normal saline solution.  The arthroscopic instruments were removed and  a dressing of Xeroform, 4 x 4 dressing sponges, Webril and Ace wrap was  applied.  The patient was awakened and taken to the recovery room without  difficulty.  Feliberto Gottron. Turner Daniels, M.D.    Ovid Curd  D:  01/23/2004  T:  01/24/2004  Job:  [id number]

## 2011-01-18 NOTE — Cardiovascular Report (Signed)
NAME:  [name], [name] NO.:  [id number]   MEDICAL RECORD NO.:  [id number]          PATIENT TYPE:  INP   LOCATION:  3003                         FACILITY:  [facility]   PHYSICIAN:  Dani Gobble, MD       DATE OF BIRTH:  12-04-1950   DATE OF PROCEDURE:  04/16/2006  DATE OF DISCHARGE:                              CARDIAC CATHETERIZATION   REFERRING PHYSICIAN:  Stroke team.   INDICATIONS:  A 60 year old gentleman admitted with stroke with goal to  evaluate for possible embolic source.   COMPLICATIONS:  None immediate.   OPERATOR:  Dani Gobble, M.D.   PROCEDURE:  The patient sedated with fentanyl 50 mcg IV and versed 6 mg IV  successfully and intubated without difficulty.   RESULTS:  1. Left atrial size is normal without obvious clot or smoke noted.  2. Left atrial appendage is small in size. No obvious clot or smoke noted.  3. Velocities are normal at approximately 60 to 80 cm/second.  4. Right atrium was normal in size without obvious clot or smoke noted.  5. Left ventricle is normal in size with at least moderate left      ventricular hypertrophy. No obvious clot, smoke or mass is noted. The      left ventricular function is mildly decreased with an estimated      ejection fraction of 40 to 50%.  6. Right ventricle is normal in size and function without obvious clot,      smoke or mass noted.  7. Aortic valve is trileaflet and normal in structure and function.  8. The mitral valve is also normal with trivial mitral regurgitation      noted.  9. Tricuspid valve is grossly normal but not well visualized.  10.Pulmonic valve is structurally normal with trivial pulmonic      insufficiency.  11.Contrast study was negative for PFO or ASD.  12.The aorta was notable for mild swirling or smoke. This is an unusual      finding in the aorta. There is also focal thickening in the ascending      thoracic aorta - the possibility of intramural hematoma cannot be      excluded  although this may be mild to moderate focal thickening. No      complex plaque is noted but consider MRI for tissue enhancement in this      area if clinically indicated.   The patient was hypertensive during this procedure with preprocedure  systolic blood pressure in the 180s, improved to 140s with sedation. No  obvious complications were noted at test end.           ______________________________  Dani Gobble, MD    AB/MEDQ  D:  04/16/2006  T:  04/16/2006  Job:  [id number]

## 2011-01-18 NOTE — Consult Note (Signed)
NAME:  William, Collier NO.:  [id number]   MEDICAL RECORD NO.:  [id number]          PATIENT TYPE:  INP   LOCATION:  2621                         FACILITY:  [facility]   PHYSICIAN:  Deanna Artis. Hickling, M.D.DATE OF BIRTH:  Jun 28, 1951   DATE OF CONSULTATION:  04/14/2006  DATE OF DISCHARGE:                                   CONSULTATION   CHIEF COMPLAINT:  Right brain stroke.   HISTORY OF PRESENT ILLNESS:  William Collier is a 60 year old right-handed Afro-  American gentleman with a history of hypertension who had gone without his  hypertensive medicines for three months.  The patient had onset of weakness  that he recognizes when he awakened at noontime August11,2007.  The patient  had gone to bed some time before midnight and presented at 2341 a full 24  hours after he was last known to be well.   The patient therefore did not qualify for t-PA nor did he qualify for any  neurological interventional studies offered at [facility].   The patient had low-grade temperature of 99.6, blood pressure 161/131.  Brain CT showed evidence of an acute right middle cerebral artery  distribution stroke superimposed upon a remote right basal ganglia stroke.   The patient was admitted by the hospitalist service.  He was noted to have  evidence of left facial droop and left paresis.   REVIEW OF SYSTEMS:  As recorded on the chart and is remarkable for history  of fractured collar bone which was suffered on two occasions two years ago,  hypertension, shortness of breath with cough on exertion (patient is a  smoker), hemorrhoids with tarry stools, frequency, urgency and occasional  hematuria.  We are aware he has significant prostatism, arthritis and joint  pain and abrasions on his left hand and left knee from falling.  The patient  admits to some difficulties with sleep.  A 12-system review is otherwise  negative.   MEDICATIONS:  Norvasc, the patient has not been taking it  for 3 months.   PAST SURGICAL HISTORY:  Cholecystectomy, right knee surgery, bilateral  shoulder surgery.   DRUG ALLERGIES:  PENICILLIN (rash).   FAMILY HISTORY:  Mother died from complications of diabetes.  Father's  history is unknown.  The patient has two brothers with diabetes and  hypertension.   SOCIAL HISTORY:  The patient smokes half to one pack of cigarettes per day  and has done so for 30 years.  He also smokes marijuana and admits to  smoking 2 days before.  His urine drug screen was positive.  The patient  drinks beer infrequently, said he last had a beer a month ago.  The patient  lives alone.  He has a daughter who is at bedside and brother who is at  bedside.  Two brothers that live in this area.  He is followed at  [facility] for his medical care.   On examination today, this is a pleasant gentleman lying in bed with his  left leg dangling out.  He seems aware of it.  Blood pressure 184/113,  resting  pulse 74, respirations 23, oxygen saturation 98%.  NIH stroke scale is 11 with 2 off for facial weakness, 4 for left arm  paresis, 2 or 3 for left leg paresis.  The leg drops down to the bed  immediately but the patient is able to lift it against gravity slightly.  There is 1 off for hemisensory, 1 for dysarthria and 1 for extinction of the  left side to double simultaneous stimuli.  MENTAL STATUS:  Patient is alert.  Names objects, reads and follows  commands.  He is oriented.  CRANIAL NERVES:  Round reactive pupils.  Visual fields full to double  simultaneous stimuli.  He has a left central seventh midline tongue.  MOTOR EXAMINATION:  Flaccid left plegia and he can lift his left leg against  gravity and can left the distal leg against gravity if I hold under the  knee.  He can actually keep his knee in the air if I ask him to work hard to  do so.  He is not able to dorsiflex or plantar flex this foot or wiggle his  toes.  He can barely shrug his left shoulder.  He is  not able to move  anything distally.  The patient has no fine motor movements on left side.  The right side appears entirely normal for gross and fine motor skills.  SENSORY EXAMINATION:  Hypesthesia on the left.  It is worse in the arm and  somewhat less prominent in the face and the leg.  He extinguishes double  simultaneous stimuli in face, arm and leg but does not have neglect.  CEREBELLAR:  Good finger-to-nose, rapid repetitive movements on the right.  Gait cannot be tested.  Deep tendon reflexes were diminished to absent.  I  do not see reflex predominance.  The patient has a right flexor and a left  extensor plantar response.   IMPRESSION:  1. Nonhemorrhagic infarction right middle cerebral artery involving the      opercular, central, angular branches.  The lesions are more patchy in      the annular distribution.  I do not see petechial hemorrhages, though      they were discussed by the reading radiologist.  I have asked another      radiologist to assess the patient for a petechial bleed and he does not      see that.  The initial reading also suggested some decreased flow      through the right carotid.  Indeed, there is a significant decrease      flow throughout the middle cerebral artery branches in the right but      the carotid seems to be fairly equal.  Carotid Dopplers do not show      hemodynamically significant stenosis.  The left vertebral is not well      in uncinated.  Indeed it is the smaller of the two, the right is      dominant.  2-D echocardiogram is pending at this time.  2. The patient likely had an embolic infarction but the source of this is      unclear.  The patient does not have atrial fibrillation.  I will know      more when I have an opportunity to evaluate the 2-D echocardiogram.      The patient has a remote right head of the caudate inferior frontal     white matter lesion that significantly antedates this stroke.  3. Hemoglobin A1c is 6.2  which  is not significant.  Cholesterol slightly      elevated at 211, LDL 142, triglyceride low at 77, HDL normal at 54,      VLDL 15.  The patient might benefit from a statin medication given the      elevated LDL.  The patient was positive for tetrahydrocannabinol. Serum      homocystine was not carried out.   RECOMMENDATIONS:  The patient had a swallowing study done which shows some  dysphagia.  It will be followed by the speech therapy.  Aspirin has been  started, Cardene was started to replace labetalol because labetalol was not  dropping his blood pressure and was dropping pulse.  The patient will need  rehabilitation if the family can provide care at home.  Otherwise, he may  need to go to skilled care facility.  I appreciate the opportunity to  participate in his care.  If you have questions or I could be of assistance,  do not hesitate to contact me.      Deanna Artis. Sharene Skeans, M.D.  Electronically Signed    [facility]/MEDQ  D:  04/14/2006  T:  04/14/2006  Job:  [id number]   cc:   Michelene Gardener, MD

## 2011-01-18 NOTE — H&P (Signed)
NAME:  William Collier, William Collier NO.:  [id number]   MEDICAL RECORD NO.:  [id number]          PATIENT TYPE:  EMS   LOCATION:  [facility]                         FACILITY:  [facility]   PHYSICIAN:  Deirdre Peer. Polite, M.D. DATE OF BIRTH:  09-Nov-1950   DATE OF ADMISSION:  04/12/2006  DATE OF DISCHARGE:                                HISTORY & PHYSICAL   CHIEF COMPLAINT:  Left-sided weakness.   HISTORY OF PRESENT ILLNESS:  A 60 year old male, with known history of  hypertension, presents to the ED with left-sided weakness.  According to the  patient, he was in his usual state of health when he went to sleep last  night.  Upon waking up around noon today whenever he tried to get up and  move around he kept falling to his left side.  The patient finally arrived  to the ED via EMS, low-grade temp of 99.6, hypertensive at 161/131.  The  patient had a CT of his head which showed acute right MCA/CVA.  Neurology  was called, however, due to the patient's prolonged symptoms admission was  deferred to [facility] hospitalist.  At the time of my eval the patient was with  obvious left-sided facial droop and left-sided paresis and again stated that  when he went to sleep last night he was doing okay.  He does admit to be  hypertensive, but he has been without his medicines greater than three  months.  He does admit to some chronic kidney disease.  Denies any other  medical problems.  Admission is deemed necessary for further evaluation and  treatment.   PAST MEDICAL HISTORY:  Significant for hypertension without meds x3 months.  Admits to having kidney problems.   MEDICATIONS:  Norvasc; however, he has been off of it for three months.   SOCIAL HISTORY:  Positive for tobacco.  Positive for half to one pack a day.  Alcohol:  None x1 year.  Occasional marijuana.   PAST SURGICAL HISTORY:  Admits to having cholecystectomy, right knee  surgery, bilateral shoulder surgery.   ALLERGIES:  Reports an allergy  to penicillin which causes a rash.   FAMILY HISTORY:  Mother deceased from diabetes complications.  Father with  unknown medical history.  Two brothers with diabetes and hypertension.   REVIEW OF SYSTEMS:  As stated in HPI.  Otherwise negative for fevers and  chills.  Does admit to some headache on the right side.  Denies any chest  pain and shortness of breath.  No abdominal pain.   PHYSICAL EXAMINATION:  VITAL SIGNS:  Temp 99.6, BP 161/131, pulse 98,  respiratory rate 20.  Repeat BP 221/125.  HEENT:  Pupils are equal, round and reactive to light.  Moist oral mucosa.  No nodes.  No JVD.  CHEST:  Moderate air movement, __________.  No rhonchi.  CARDIOVASCULAR:  Regular.  ABDOMEN:  Nontender.  EXTREMITIES:  Pulse 2+.  No edema.  NEURO:  Extraocular muscles intact.  Cranial nerves significant for left  facial droop.  Tongue essentially at midline.  MOTOR:  The patient has obvious dense left hemiparesis on  the left upper  extremity greater than the lower extremity.  Difficult to elicit reflex.  Gait not tested.  Upgoing toe on the left.   LABORATORY:  CT of the head:  Acute right MCA, CVA.  CBC:  White count 10.8,  hemoglobin 17.3, platelets 227, neutrophil count 84.  INR 1.0.  Alcohol  level less than 5.  Sodium 144, potassium 3.6, chloride 108, carbon dioxide  25, BUN 29, creatinine 2.1.   ASSESSMENT:  1. 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  2. Hypertensive urgency.  3. Chronic renal insufficiency.  4. Hyperglycemia.  Rule out diabetes.  5. Low-grade fever.   I recommend the patient be admitted to an ICU bed as he has had acute CVA  with dense hemiparesis and currently with still hypertensive urgency.  The  patient will be given IV labetalol x1 as his diastolic is greater than 120.  He will be monitored in ICU for frequent neuro checks.  We will obtain  MRI/MRA of the brain, carotid Dopplers, 2D echo.  Further  studies for risk  stratification, i.e., lipids, hemoglobin A1c and TSH.  The patient will also  require a neuro evaluation.  We will make further recommendations as deemed  necessary.      Deirdre Peer. Polite, M.D.  Electronically Signed     RDP/MEDQ  D:  04/13/2006  T:  04/13/2006  Job:  [id number]

## 2011-01-26 ENCOUNTER — Emergency Department (HOSPITAL_COMMUNITY)
Admission: EM | Admit: 2011-01-26 | Discharge: 2011-01-26 | Disposition: A | Discharge status: Home / Self Care | Payer: Medicare Other | Attending: Emergency Medicine | Admitting: Emergency Medicine

## 2011-01-26 DIAGNOSIS — T675XXA Heat exhaustion, unspecified, initial encounter: Secondary | ICD-10-CM | POA: Insufficient documentation

## 2011-01-26 DIAGNOSIS — I10 Essential (primary) hypertension: Secondary | ICD-10-CM | POA: Insufficient documentation

## 2011-01-26 DIAGNOSIS — Y9389 Activity, other specified: Secondary | ICD-10-CM | POA: Insufficient documentation

## 2011-01-26 DIAGNOSIS — R112 Nausea with vomiting, unspecified: Secondary | ICD-10-CM | POA: Insufficient documentation

## 2011-01-26 DIAGNOSIS — X30XXXA Exposure to excessive natural heat, initial encounter: Secondary | ICD-10-CM | POA: Insufficient documentation

## 2011-01-26 DIAGNOSIS — E7800 Pure hypercholesterolemia, unspecified: Secondary | ICD-10-CM | POA: Insufficient documentation

## 2011-01-26 DIAGNOSIS — Z8673 Personal history of transient ischemic attack (TIA), and cerebral infarction without residual deficits: Secondary | ICD-10-CM | POA: Insufficient documentation

## 2011-01-26 LAB — CK TOTAL AND CKMB (NOT AT ARMC)
Relative Index: INVALID (ref 0.0–2.5)
Total CK: 60 U/L (ref 7–232)

## 2011-01-26 LAB — CBC
HCT: 40.8 % (ref 39.0–52.0)
MCH: 29.6 pg (ref 26.0–34.0)
MCV: 87 fL (ref 78.0–100.0)
RBC: 4.69 MIL/uL (ref 4.22–5.81)
WBC: 4.4 10*3/uL (ref 4.0–10.5)

## 2011-01-26 LAB — DIFFERENTIAL
Basophils Absolute: 0 10*3/uL (ref 0.0–0.1)
Basophils Relative: 0 % (ref 0–1)
Neutro Abs: 2.6 10*3/uL (ref 1.7–7.7)

## 2011-01-26 LAB — BASIC METABOLIC PANEL
CO2: 25 mEq/L (ref 19–32)
Chloride: 103 mEq/L (ref 96–112)
Creatinine, Ser: 1.93 mg/dL — ABNORMAL HIGH (ref 0.4–1.5)
GFR calc Af Amer: 43 mL/min — ABNORMAL LOW (ref >60)
GFR calc non Af Amer: 36 mL/min — ABNORMAL LOW (ref >60)

## 2011-03-14 ENCOUNTER — Inpatient Hospital Stay (INDEPENDENT_AMBULATORY_CARE_PROVIDER_SITE_OTHER)
Admission: RE | Admit: 2011-03-14 | Discharge: 2011-03-14 | Disposition: A | Discharge status: Home / Self Care | Payer: Medicare Other | Source: Ambulatory Visit | Attending: Family Medicine | Admitting: Family Medicine

## 2011-03-14 DIAGNOSIS — R319 Hematuria, unspecified: Secondary | ICD-10-CM

## 2011-03-14 DIAGNOSIS — I10 Essential (primary) hypertension: Secondary | ICD-10-CM

## 2011-03-14 LAB — POCT URINALYSIS DIP (DEVICE)
Glucose, UA: NEGATIVE mg/dL
Nitrite: NEGATIVE
Urobilinogen, UA: 1 mg/dL (ref 0.0–1.0)

## 2011-03-18 ENCOUNTER — Emergency Department (HOSPITAL_COMMUNITY)
Admission: EM | Admit: 2011-03-18 | Discharge: 2011-03-18 | Disposition: A | Discharge status: Home / Self Care | Payer: Medicare Other | Attending: Emergency Medicine | Admitting: Emergency Medicine

## 2011-03-18 ENCOUNTER — Emergency Department (HOSPITAL_COMMUNITY): Payer: Medicare Other

## 2011-03-18 DIAGNOSIS — I10 Essential (primary) hypertension: Secondary | ICD-10-CM | POA: Insufficient documentation

## 2011-03-18 DIAGNOSIS — E7800 Pure hypercholesterolemia, unspecified: Secondary | ICD-10-CM | POA: Insufficient documentation

## 2011-03-18 DIAGNOSIS — R109 Unspecified abdominal pain: Secondary | ICD-10-CM | POA: Insufficient documentation

## 2011-03-18 DIAGNOSIS — R319 Hematuria, unspecified: Secondary | ICD-10-CM | POA: Insufficient documentation

## 2011-03-18 DIAGNOSIS — Z8673 Personal history of transient ischemic attack (TIA), and cerebral infarction without residual deficits: Secondary | ICD-10-CM | POA: Insufficient documentation

## 2011-03-18 LAB — CBC
HCT: 38.2 % — ABNORMAL LOW (ref 39.0–52.0)
Hemoglobin: 12.7 g/dL — ABNORMAL LOW (ref 13.0–17.0)
MCH: 29.4 pg (ref 26.0–34.0)
MCV: 88.4 fL (ref 78.0–100.0)
RBC: 4.32 MIL/uL (ref 4.22–5.81)

## 2011-03-18 LAB — URINE MICROSCOPIC-ADD ON

## 2011-03-18 LAB — DIFFERENTIAL
Lymphocytes Relative: 31 % (ref 12–46)
Lymphs Abs: 1.6 10*3/uL (ref 0.7–4.0)
Monocytes Relative: 8 % (ref 3–12)
Neutro Abs: 3 10*3/uL (ref 1.7–7.7)
Neutrophils Relative %: 60 % (ref 43–77)

## 2011-03-18 LAB — BASIC METABOLIC PANEL
CO2: 25 mEq/L (ref 19–32)
Calcium: 9.5 mg/dL (ref 8.4–10.5)
Chloride: 108 mEq/L (ref 96–112)
Creatinine, Ser: 1.63 mg/dL — ABNORMAL HIGH (ref 0.50–1.35)
Glucose, Bld: 115 mg/dL — ABNORMAL HIGH (ref 70–99)

## 2011-03-18 LAB — URINALYSIS, ROUTINE W REFLEX MICROSCOPIC
Ketones, ur: 15 mg/dL — AB
Protein, ur: 100 mg/dL — AB
Urobilinogen, UA: 1 mg/dL (ref 0.0–1.0)

## 2011-03-19 LAB — URINE CULTURE: Colony Count: 6000

## 2011-05-03 ENCOUNTER — Other Ambulatory Visit (HOSPITAL_COMMUNITY): Payer: Self-pay | Admitting: Urology

## 2011-05-03 DIAGNOSIS — N281 Cyst of kidney, acquired: Secondary | ICD-10-CM

## 2011-05-10 ENCOUNTER — Ambulatory Visit (HOSPITAL_COMMUNITY)
Admission: RE | Admit: 2011-05-10 | Discharge: 2011-05-10 | Disposition: A | Discharge status: Home / Self Care | Payer: Medicare Other | Source: Ambulatory Visit | Attending: Urology | Admitting: Urology

## 2011-05-10 DIAGNOSIS — Q619 Cystic kidney disease, unspecified: Secondary | ICD-10-CM | POA: Insufficient documentation

## 2011-05-10 DIAGNOSIS — N281 Cyst of kidney, acquired: Secondary | ICD-10-CM

## 2011-05-10 DIAGNOSIS — K7689 Other specified diseases of liver: Secondary | ICD-10-CM | POA: Insufficient documentation

## 2011-05-10 DIAGNOSIS — R319 Hematuria, unspecified: Secondary | ICD-10-CM | POA: Insufficient documentation

## 2011-05-10 DIAGNOSIS — N200 Calculus of kidney: Secondary | ICD-10-CM | POA: Insufficient documentation

## 2011-05-10 DIAGNOSIS — I10 Essential (primary) hypertension: Secondary | ICD-10-CM | POA: Insufficient documentation

## 2011-05-10 DIAGNOSIS — N289 Disorder of kidney and ureter, unspecified: Secondary | ICD-10-CM | POA: Insufficient documentation

## 2011-05-20 ENCOUNTER — Ambulatory Visit (HOSPITAL_COMMUNITY)
Admission: RE | Admit: 2011-05-20 | Discharge: 2011-05-20 | Disposition: A | Discharge status: Home / Self Care | Payer: Medicare Other | Source: Ambulatory Visit | Attending: Urology | Admitting: Urology

## 2011-05-20 DIAGNOSIS — K219 Gastro-esophageal reflux disease without esophagitis: Secondary | ICD-10-CM | POA: Insufficient documentation

## 2011-05-20 DIAGNOSIS — Z8673 Personal history of transient ischemic attack (TIA), and cerebral infarction without residual deficits: Secondary | ICD-10-CM | POA: Insufficient documentation

## 2011-05-20 DIAGNOSIS — F17200 Nicotine dependence, unspecified, uncomplicated: Secondary | ICD-10-CM | POA: Insufficient documentation

## 2011-05-20 DIAGNOSIS — I10 Essential (primary) hypertension: Secondary | ICD-10-CM | POA: Insufficient documentation

## 2011-05-20 DIAGNOSIS — N200 Calculus of kidney: Secondary | ICD-10-CM | POA: Insufficient documentation

## 2011-06-17 LAB — DIFFERENTIAL
Basophils Absolute: 0
Basophils Relative: 0
Lymphocytes Relative: 18
Monocytes Absolute: 0.2
Monocytes Relative: 3
Neutro Abs: 5.6
Neutrophils Relative %: 80 — ABNORMAL HIGH

## 2011-06-17 LAB — POCT CARDIAC MARKERS
CKMB, poc: <1 — ABNORMAL LOW
CKMB, poc: <1 — ABNORMAL LOW
Myoglobin, poc: 92.4
Troponin i, poc: <0.05

## 2011-06-17 LAB — CBC
Hemoglobin: 15
RBC: 5.11
RDW: 15.4 — ABNORMAL HIGH

## 2011-06-26 ENCOUNTER — Other Ambulatory Visit (HOSPITAL_COMMUNITY): Payer: Self-pay | Admitting: Urology

## 2011-06-26 ENCOUNTER — Other Ambulatory Visit: Payer: Self-pay | Admitting: Urology

## 2011-06-26 ENCOUNTER — Encounter (HOSPITAL_COMMUNITY): Payer: Medicare Other

## 2011-06-26 ENCOUNTER — Ambulatory Visit (HOSPITAL_COMMUNITY)
Admission: RE | Admit: 2011-06-26 | Discharge: 2011-06-26 | Disposition: A | Discharge status: Home / Self Care | Payer: Medicare Other | Source: Ambulatory Visit | Attending: Urology | Admitting: Urology

## 2011-06-26 DIAGNOSIS — Z01818 Encounter for other preprocedural examination: Secondary | ICD-10-CM | POA: Insufficient documentation

## 2011-06-26 DIAGNOSIS — I10 Essential (primary) hypertension: Secondary | ICD-10-CM | POA: Insufficient documentation

## 2011-06-26 DIAGNOSIS — D494 Neoplasm of unspecified behavior of bladder: Secondary | ICD-10-CM | POA: Insufficient documentation

## 2011-06-26 DIAGNOSIS — Z01812 Encounter for preprocedural laboratory examination: Secondary | ICD-10-CM | POA: Insufficient documentation

## 2011-06-26 DIAGNOSIS — Z01811 Encounter for preprocedural respiratory examination: Secondary | ICD-10-CM

## 2011-06-26 LAB — BASIC METABOLIC PANEL
GFR calc Af Amer: 49 mL/min — ABNORMAL LOW (ref >90)
Sodium: 141 mEq/L (ref 135–145)

## 2011-06-26 LAB — CBC
HCT: 41.3 % (ref 39.0–52.0)
Hemoglobin: 13.7 g/dL (ref 13.0–17.0)
MCH: 29.7 pg (ref 26.0–34.0)
MCHC: 33.2 g/dL (ref 30.0–36.0)
MCV: 89.4 fL (ref 78.0–100.0)
RBC: 4.62 MIL/uL (ref 4.22–5.81)

## 2011-06-26 LAB — SURGICAL PCR SCREEN: MRSA, PCR: NEGATIVE

## 2011-07-01 ENCOUNTER — Ambulatory Visit (HOSPITAL_COMMUNITY)
Admission: RE | Admit: 2011-07-01 | Discharge: 2011-07-01 | Disposition: A | Discharge status: Home / Self Care | Payer: Medicare Other | Source: Ambulatory Visit | Attending: Urology | Admitting: Urology

## 2011-07-01 ENCOUNTER — Other Ambulatory Visit: Payer: Self-pay | Admitting: Urology

## 2011-07-01 DIAGNOSIS — N281 Cyst of kidney, acquired: Secondary | ICD-10-CM | POA: Insufficient documentation

## 2011-07-01 DIAGNOSIS — C679 Malignant neoplasm of bladder, unspecified: Secondary | ICD-10-CM | POA: Insufficient documentation

## 2011-07-01 DIAGNOSIS — R5381 Other malaise: Secondary | ICD-10-CM | POA: Insufficient documentation

## 2011-07-01 DIAGNOSIS — IMO0002 Reserved for concepts with insufficient information to code with codable children: Secondary | ICD-10-CM | POA: Insufficient documentation

## 2011-07-01 DIAGNOSIS — I69998 Other sequelae following unspecified cerebrovascular disease: Secondary | ICD-10-CM | POA: Insufficient documentation

## 2011-07-01 DIAGNOSIS — I10 Essential (primary) hypertension: Secondary | ICD-10-CM | POA: Insufficient documentation

## 2011-07-01 DIAGNOSIS — F17200 Nicotine dependence, unspecified, uncomplicated: Secondary | ICD-10-CM | POA: Insufficient documentation

## 2011-07-08 NOTE — Op Note (Signed)
NAME:  William Collier, William Collier NO.:  [id number]  MEDICAL RECORD NO.:  [id number]  LOCATION:  [facility]                         FACILITY:  [facility]  PHYSICIAN:  Natalia Leatherwood, MD    DATE OF BIRTH:  [birth year]  DATE OF PROCEDURE:  07/01/2011 DATE OF DISCHARGE:                              OPERATIVE REPORT   PREOPERATIVE DIAGNOSIS:  Bladder tumor.  POSTOPERATIVE DIAGNOSES:  Bladder tumor and extraperitoneal bladder perforation.  PROCEDURES PERFORMED: 1. Cystoscopy. 2. Right retrograde pyelogram. 3. Transurethral resection of bladder tumor larger than 5 cm. 4. Gravity cystogram on-table. 5. Fluoroscopy. 6. Catheter placement.  ESTIMATED BLOOD LOSS:  Minimal.  SPECIMEN:   1.  Bladder neck biopsy.  2.  Bladder tumor.  FINDINGS:  Extraperitoneal bladder perforation found on gravity cystogram.  The patient also had a very brisk bilateral obturator reflex.  The patient had extensive bladder tumor covering a large amount of his bladder surface.  His left ureteral orifice was completely obstructed from tumor.  Right retrograde pyelogram showed no filling defects in the upper tract or in the entire ureter.  ESTIMATED BLOOD LOSS:  Minimal.  COMPLICATIONS:  None.  HISTORY OF PRESENT ILLNESS:  This is a 60 year old gentleman who has history of gross hematuria and bilateral nephrolithiasis.  On office cystoscopy, he was found have a large amount of bladder tumor.  It was recommended he have a transurethral resection of bladder tumor and bilateral retrograde pyelogram.  The patient presented today for that procedure.  DESCRIPTION OF PROCEDURE:  After informed consent was obtained, the patient was taken to the operating room, was placed in supine position, IV antibiotics were infused and general anesthesia was induced.  He was then placed in dorsal lithotomy position where his genitals were prepped and draped in the usual fashion.  A time-out was performed in which correct  patient, surgical site, and procedure and were agreed upon by the team.  After being placed in the dorsal lithotomy position, his genitals were prepped and draped in usual fashion.  A visual obturator to the gyrus resectoscope was passed through the urethra and into the bladder. Immediately upon entering the bladder, there was tumor at the bladder neck at the 12 o'clock position.  Upon entering the bladder, there was a large amount of tumor throughout the bladder.  The right ureteral orifice was able to be visualized, but the left ureteral orifice was covered in tumor.  A right retrograde pyelogram was obtained by cannulating the right ureter with a 5-French open-ended Pollock catheter.  Retrograde pyelogram revealed no filling defects in the upper tracts over the entire ureter and renal pelvis.  It emptied out well.  After this, the resection device to the gyrus was placed and a transurethral resection of bladder tumor was carried out.  As noted, the bladder tumor was larger than 5 cm.  This bladder tumor was sessile and did appear to invade deep into the bladder.  In fact, I did have to cut rather deep into the bladder to remove bleeding friable tumor.  The patient was also noted to have a very brisk bilateral obturator reflexes, which also made it difficult to cut deeply while at the  same time, preventing perforation.  Tumor was removed from the bladder floor, the right bladder around the ureter, the right lateral bladder wall, the bladder dome, the posterior bladder and the left lateral bladder wall as well as around the bladder neck.  Resection of the bladder neck/prostatic urethra was sent separately and all the other chips were irrigated out and removed and sent for permanent pathology.  After this was done, hemostasis was maintained and then a gravity cystogram was obtained by obtaining a precontrast film and then the bladder was filled by gravity with at least 300 cc of  Cystografin.  Film showed there to be a small perforation in the dome of the bladder that tracked around the bladder, but did not tract into the peritoneum.  The bladder was drained, then gently irrigated to remove any contrast from the bladder.  There was bladder perforation, but it was extraperitoneal. Following this, a 24-French 3-way Foley catheter was placed with 30 cc sterile water into the balloon.  The bladder irrigated easily with some pink-tinged urine.  This completed the procedure.  B and O suppository was placed.  He was placed back in supine position.  Anesthesia was reversed.  He was taken to PACU in a stable condition.  Patient will go home with catheter in place.  He was given IV antibiotics prior to beginning procedure.          ______________________________ Natalia Leatherwood, MD     DW/MEDQ  D:  07/01/2011  T:  07/01/2011  Job:  [id number]  Electronically Signed by Natalia Leatherwood MD on 07/08/2011 06:53:03 AM

## 2011-07-18 ENCOUNTER — Other Ambulatory Visit: Payer: Self-pay | Admitting: Urology

## 2011-08-16 ENCOUNTER — Encounter (HOSPITAL_COMMUNITY): Payer: Self-pay | Admitting: Pharmacy Technician

## 2011-08-20 ENCOUNTER — Encounter (HOSPITAL_COMMUNITY): Payer: Self-pay

## 2011-08-20 ENCOUNTER — Encounter (HOSPITAL_COMMUNITY)
Admission: RE | Admit: 2011-08-20 | Discharge: 2011-08-20 | Disposition: A | Discharge status: Home / Self Care | Payer: Medicare Other | Source: Ambulatory Visit | Attending: Urology | Admitting: Urology

## 2011-08-20 DIAGNOSIS — I499 Cardiac arrhythmia, unspecified: Secondary | ICD-10-CM

## 2011-08-20 DIAGNOSIS — I639 Cerebral infarction, unspecified: Secondary | ICD-10-CM

## 2011-08-20 DIAGNOSIS — I10 Essential (primary) hypertension: Secondary | ICD-10-CM

## 2011-08-20 DIAGNOSIS — C801 Malignant (primary) neoplasm, unspecified: Secondary | ICD-10-CM

## 2011-08-20 DIAGNOSIS — K219 Gastro-esophageal reflux disease without esophagitis: Secondary | ICD-10-CM

## 2011-08-20 HISTORY — DX: Gastro-esophageal reflux disease without esophagitis: K21.9

## 2011-08-20 HISTORY — PX: KNEE ARTHROSCOPY: SUR90

## 2011-08-20 HISTORY — DX: Malignant (primary) neoplasm, unspecified: C80.1

## 2011-08-20 HISTORY — DX: Unspecified osteoarthritis, unspecified site: M19.90

## 2011-08-20 HISTORY — DX: Cerebral infarction, unspecified: I63.9

## 2011-08-20 HISTORY — DX: Cardiac arrhythmia, unspecified: I49.9

## 2011-08-20 HISTORY — PX: BLADDER SURGERY: SHX569

## 2011-08-20 HISTORY — PX: SHOULDER ARTHROTOMY: SUR111

## 2011-08-20 HISTORY — DX: Essential (primary) hypertension: I10

## 2011-08-20 LAB — CBC
MCV: 87.8 fL (ref 78.0–100.0)
Platelets: 212 10*3/uL (ref 150–400)
RDW: 15.1 % (ref 11.5–15.5)
WBC: 5.3 10*3/uL (ref 4.0–10.5)

## 2011-08-20 LAB — BASIC METABOLIC PANEL
Calcium: 10 mg/dL (ref 8.4–10.5)
Chloride: 105 mEq/L (ref 96–112)
Creatinine, Ser: 1.7 mg/dL — ABNORMAL HIGH (ref 0.50–1.35)
GFR calc Af Amer: 49 mL/min — ABNORMAL LOW (ref >90)

## 2011-08-20 LAB — SURGICAL PCR SCREEN: MRSA, PCR: NEGATIVE

## 2011-08-20 NOTE — Patient Instructions (Signed)
[name]  08/20/2011   Your procedure is scheduled on: 08-21-11 Report to [facility] at 10:45 AM.  Call this number if you have problems the morning of surgery: [phone]   Remember:   Do not eat food:After Midnight.  May have clear liquids:until Midnight .  Clear liquids include soda, tea, black coffee, apple or grape juice, broth.(may have Clear Liquids 12 mignight to 0700 AM,then nothing  Take these medicines the morning of surgery with A SIP OF WATER: Amlodipine, Clonidine   Do not wear jewelry, make-up or nail polish.  Do not wear lotions, powders, or perfumes. You may wear deodorant.  Do not shave 48 hours prior to surgery.  Do not bring valuables to the hospital.  Contacts, dentures or bridgework may not be worn into surgery.  Leave suitcase in the car. After surgery it may be brought to your room.  For patients admitted to the hospital, checkout time is 11:00 AM the day of discharge.   Patients discharged the day of surgery will not be allowed to drive home.  Name and phone number of your driver: Britta Mccreedy, friend  Special Instructions: CHG Shower Use Special Wash: 1/2 bottle night before surgery and 1/2 bottle morning of surgery.   Please read over the following fact sheets that you were given: MRSA Information

## 2011-08-20 NOTE — Pre-Procedure Instructions (Signed)
08-20-11 EKG(01-26-11), CXR (06-26-11)

## 2011-08-21 ENCOUNTER — Encounter (HOSPITAL_COMMUNITY): Payer: Self-pay | Admitting: *Deleted

## 2011-08-21 ENCOUNTER — Encounter (HOSPITAL_COMMUNITY): Payer: Self-pay | Admitting: Anesthesiology

## 2011-08-21 ENCOUNTER — Other Ambulatory Visit: Payer: Self-pay | Admitting: Urology

## 2011-08-21 ENCOUNTER — Encounter (HOSPITAL_COMMUNITY)
Admission: RE | Disposition: A | Discharge status: Home / Self Care | Payer: Self-pay | Source: Ambulatory Visit | Attending: Urology

## 2011-08-21 ENCOUNTER — Ambulatory Visit (HOSPITAL_COMMUNITY): Payer: Medicare Other | Admitting: Anesthesiology

## 2011-08-21 ENCOUNTER — Ambulatory Visit (HOSPITAL_COMMUNITY)
Admission: RE | Admit: 2011-08-21 | Discharge: 2011-08-21 | Disposition: A | Discharge status: Home / Self Care | Payer: Medicare Other | Source: Ambulatory Visit | Attending: Urology | Admitting: Urology

## 2011-08-21 DIAGNOSIS — C679 Malignant neoplasm of bladder, unspecified: Secondary | ICD-10-CM | POA: Insufficient documentation

## 2011-08-21 DIAGNOSIS — E7800 Pure hypercholesterolemia, unspecified: Secondary | ICD-10-CM | POA: Insufficient documentation

## 2011-08-21 DIAGNOSIS — I10 Essential (primary) hypertension: Secondary | ICD-10-CM | POA: Insufficient documentation

## 2011-08-21 DIAGNOSIS — I69959 Hemiplegia and hemiparesis following unspecified cerebrovascular disease affecting unspecified side: Secondary | ICD-10-CM | POA: Insufficient documentation

## 2011-08-21 DIAGNOSIS — Z7982 Long term (current) use of aspirin: Secondary | ICD-10-CM | POA: Insufficient documentation

## 2011-08-21 DIAGNOSIS — K219 Gastro-esophageal reflux disease without esophagitis: Secondary | ICD-10-CM | POA: Insufficient documentation

## 2011-08-21 DIAGNOSIS — Z01812 Encounter for preprocedural laboratory examination: Secondary | ICD-10-CM | POA: Insufficient documentation

## 2011-08-21 DIAGNOSIS — Z79899 Other long term (current) drug therapy: Secondary | ICD-10-CM | POA: Insufficient documentation

## 2011-08-21 HISTORY — PX: CYSTOSCOPY W/ URETERAL STENT PLACEMENT: SHX1429

## 2011-08-21 HISTORY — PX: CYSTOSCOPY WITH BIOPSY: SHX5122

## 2011-08-21 HISTORY — PX: TRANSURETHRAL RESECTION OF BLADDER TUMOR: SHX2575

## 2011-08-21 SURGERY — CYSTOSCOPY, WITH RETROGRADE PYELOGRAM AND URETERAL STENT INSERTION
Anesthesia: General | Site: Bladder | Wound class: Clean Contaminated

## 2011-08-21 MED ORDER — LABETALOL HCL 5 MG/ML IV SOLN
5.0000 mg | INTRAVENOUS | Status: AC | PRN
  Administered 2011-08-21 (×5): 5 mg via INTRAVENOUS
  Filled 2011-08-21: qty 4

## 2011-08-21 MED ORDER — OXYBUTYNIN CHLORIDE 5 MG PO TABS: 5.0000 mg | ORAL_TABLET | Freq: Four times a day (QID) | ORAL | Status: DC | PRN

## 2011-08-21 MED ORDER — BELLADONNA ALKALOIDS-OPIUM 16.2-60 MG RE SUPP
RECTAL | Status: AC
  Filled 2011-08-21: qty 1

## 2011-08-21 MED ORDER — CIPROFLOXACIN HCL 500 MG PO TABS: 500.0000 mg | ORAL_TABLET | Freq: Two times a day (BID) | ORAL | Status: AC

## 2011-08-21 MED ORDER — ONDANSETRON HCL 4 MG/2ML IJ SOLN
INTRAMUSCULAR | Status: DC | PRN
  Administered 2011-08-21: 4 mg via INTRAVENOUS

## 2011-08-21 MED ORDER — FENTANYL CITRATE 0.05 MG/ML IJ SOLN
INTRAMUSCULAR | Status: DC | PRN
  Administered 2011-08-21 (×2): 25 ug via INTRAVENOUS
  Administered 2011-08-21: 50 ug via INTRAVENOUS
  Administered 2011-08-21: 25 ug via INTRAVENOUS
  Administered 2011-08-21: 50 ug via INTRAVENOUS
  Administered 2011-08-21: 25 ug via INTRAVENOUS
  Administered 2011-08-21: 50 ug via INTRAVENOUS

## 2011-08-21 MED ORDER — HYOSCYAMINE SULFATE 0.125 MG PO TABS: 0.1250 mg | ORAL_TABLET | ORAL | Status: AC | PRN

## 2011-08-21 MED ORDER — BELLADONNA ALKALOIDS-OPIUM 16.2-60 MG RE SUPP
RECTAL | Status: DC | PRN
  Administered 2011-08-21: 1 via RECTAL

## 2011-08-21 MED ORDER — CIPROFLOXACIN IN D5W 400 MG/200ML IV SOLN
INTRAVENOUS | Status: AC
  Filled 2011-08-21: qty 200

## 2011-08-21 MED ORDER — LACTATED RINGERS IV SOLN
INTRAVENOUS | Status: DC | PRN
  Administered 2011-08-21 (×2): via INTRAVENOUS

## 2011-08-21 MED ORDER — SODIUM CHLORIDE 0.9 % IR SOLN
Status: DC | PRN
  Administered 2011-08-21: 33000 mL

## 2011-08-21 MED ORDER — MEPERIDINE HCL 25 MG/ML IJ SOLN: 6.2500 mg | INTRAMUSCULAR | Status: DC | PRN

## 2011-08-21 MED ORDER — LACTATED RINGERS IV SOLN
INTRAVENOUS | Status: DC
  Administered 2011-08-21: 16:00:00 via INTRAVENOUS
  Administered 2011-08-21: 1000 mL via INTRAVENOUS

## 2011-08-21 MED ORDER — LIDOCAINE HCL 2 % EX GEL
CUTANEOUS | Status: AC
  Filled 2011-08-21: qty 10

## 2011-08-21 MED ORDER — ASPIRIN EC 325 MG PO TBEC: 325.0000 mg | DELAYED_RELEASE_TABLET | ORAL | Status: DC

## 2011-08-21 MED ORDER — MIDAZOLAM HCL 5 MG/5ML IJ SOLN
INTRAMUSCULAR | Status: DC | PRN
  Administered 2011-08-21: 1.5 mg via INTRAVENOUS
  Administered 2011-08-21: 0.5 mg via INTRAVENOUS

## 2011-08-21 MED ORDER — CIPROFLOXACIN IN D5W 400 MG/200ML IV SOLN
400.0000 mg | INTRAVENOUS | Status: AC
  Administered 2011-08-21: 400 mg via INTRAVENOUS

## 2011-08-21 MED ORDER — SUCCINYLCHOLINE CHLORIDE 20 MG/ML IJ SOLN
INTRAMUSCULAR | Status: DC | PRN
  Administered 2011-08-21: 100 mg via INTRAVENOUS

## 2011-08-21 MED ORDER — IOHEXOL 300 MG/ML  SOLN
INTRAMUSCULAR | Status: DC | PRN
  Administered 2011-08-21: 50 mL

## 2011-08-21 MED ORDER — INDIGOTINDISULFONATE SODIUM 8 MG/ML IJ SOLN
INTRAMUSCULAR | Status: AC
  Filled 2011-08-21: qty 5

## 2011-08-21 MED ORDER — IOHEXOL 300 MG/ML  SOLN
INTRAMUSCULAR | Status: AC
  Filled 2011-08-21: qty 1

## 2011-08-21 MED ORDER — PROMETHAZINE HCL 25 MG/ML IJ SOLN: 6.2500 mg | INTRAMUSCULAR | Status: DC | PRN

## 2011-08-21 MED ORDER — PROPOFOL 10 MG/ML IV EMUL
INTRAVENOUS | Status: DC | PRN
  Administered 2011-08-21: 150 mg via INTRAVENOUS

## 2011-08-21 MED ORDER — LIDOCAINE HCL 1 % IJ SOLN
INTRAMUSCULAR | Status: DC | PRN
  Administered 2011-08-21: 100 mg via INTRADERMAL

## 2011-08-21 MED ORDER — LACTATED RINGERS IV SOLN: INTRAVENOUS | Status: DC

## 2011-08-21 MED ORDER — PHENAZOPYRIDINE HCL 100 MG PO TABS: 100.0000 mg | ORAL_TABLET | Freq: Three times a day (TID) | ORAL | Status: AC | PRN

## 2011-08-21 MED ORDER — FENTANYL CITRATE 0.05 MG/ML IJ SOLN: 25.0000 ug | INTRAMUSCULAR | Status: DC | PRN

## 2011-08-21 MED ORDER — ESMOLOL HCL 10 MG/ML IV SOLN
INTRAVENOUS | Status: DC | PRN
  Administered 2011-08-21: 20 mg via INTRAVENOUS

## 2011-08-21 MED ORDER — LIDOCAINE HCL 2 % EX GEL
CUTANEOUS | Status: DC | PRN
  Administered 2011-08-21: 1 via URETHRAL

## 2011-08-21 MED ORDER — OXYCODONE-ACETAMINOPHEN 5-325 MG PO TABS: 1.0000 | ORAL_TABLET | ORAL | Status: AC | PRN

## 2011-08-21 MED ORDER — SENNOSIDES-DOCUSATE SODIUM 8.6-50 MG PO TABS: 1.0000 | ORAL_TABLET | Freq: Two times a day (BID) | ORAL | Status: DC

## 2011-08-21 SURGICAL SUPPLY — 41 items
ADAPTER CATH URET PLST 4-6FR (CATHETERS) ×4 IMPLANT
ADPR CATH URET STRL DISP 4-6FR (CATHETERS) ×3
BAG URINE DRAINAGE (UROLOGICAL SUPPLIES) IMPLANT
BAG URO CATCHER STRL LF (DRAPE) ×4 IMPLANT
BASKET ZERO TIP NITINOL 2.4FR (BASKET) IMPLANT
BSKT STON RTRVL ZERO TP 2.4FR (BASKET)
CATH HEMA 3WAY 30CC 22FR COUDE (CATHETERS) ×4 IMPLANT
CATH INTERMIT  6FR 70CM (CATHETERS) ×4 IMPLANT
CATH ROBINSON RED A/P 16FR (CATHETERS) IMPLANT
CATH URET 5FR 28IN OPEN ENDED (CATHETERS) ×4 IMPLANT
CLOTH BEACON ORANGE TIMEOUT ST (SAFETY) ×4 IMPLANT
DRAPE CAMERA CLOSED 9X96 (DRAPES) ×4 IMPLANT
ELECT LOOP MED HF 24F 12D (CUTTING LOOP) ×4 IMPLANT
ELECT REM PT RETURN 9FT ADLT (ELECTROSURGICAL) ×4
ELECTRODE REM PT RTRN 9FT ADLT (ELECTROSURGICAL) ×3 IMPLANT
EVACUATOR MICROVAS BLADDER (UROLOGICAL SUPPLIES) IMPLANT
GLOVE BIOGEL M STRL SZ7.5 (GLOVE) ×4 IMPLANT
GLOVE BIOGEL PI IND STRL 7.5 (GLOVE) ×6 IMPLANT
GLOVE BIOGEL PI INDICATOR 7.5 (GLOVE) ×2
GLOVE ECLIPSE 7.0 STRL STRAW (GLOVE) ×4 IMPLANT
GLOVE ECLIPSE 7.5 STRL STRAW (GLOVE) ×4 IMPLANT
GLOVE SURG SS PI 8.0 STRL IVOR (GLOVE) ×4 IMPLANT
GOWN PREVENTION PLUS XLARGE (GOWN DISPOSABLE) ×8 IMPLANT
GOWN STRL NON-REIN LRG LVL3 (GOWN DISPOSABLE) ×8 IMPLANT
GOWN STRL REIN XL XLG (GOWN DISPOSABLE) ×4 IMPLANT
GUIDEWIRE ANG ZIPWIRE 038X150 (WIRE) ×4 IMPLANT
GUIDEWIRE STR DUAL SENSOR (WIRE) ×4 IMPLANT
KIT ASPIRATION TUBING (SET/KITS/TRAYS/PACK) ×4 IMPLANT
LOOPS RESECTOSCOPE DISP (ELECTROSURGICAL) IMPLANT
MANIFOLD NEPTUNE II (INSTRUMENTS) ×8 IMPLANT
MARKER SKIN DUAL TIP RULER LAB (MISCELLANEOUS) ×4 IMPLANT
NDL SAFETY ECLIPSE 18X1.5 (NEEDLE) IMPLANT
NEEDLE HYPO 18GX1.5 SHARP (NEEDLE)
NEEDLE HYPO 22GX1.5 SAFETY (NEEDLE) IMPLANT
NS IRRIG 1000ML POUR BTL (IV SOLUTION) ×4 IMPLANT
PACK CYSTO (CUSTOM PROCEDURE TRAY) ×4 IMPLANT
PLUG CATH AND CAP STER (CATHETERS) ×4 IMPLANT
SET WARMING FLUID IRRIGATION (MISCELLANEOUS) ×4 IMPLANT
SYRINGE IRR TOOMEY STRL 70CC (SYRINGE) ×4 IMPLANT
TUBING CONNECTING 10 (TUBING) ×4 IMPLANT
WATER STERILE IRR 3000ML UROMA (IV SOLUTION) ×4 IMPLANT

## 2011-08-21 NOTE — H&P (Signed)
Chief Complaint  Bladder cancer   History of Present Illness      60 year old male who had a resection of bladder tumor on July 01, 2011. He did have a small extra peritoneal bladder perforation which required catheterization for 2 weeks.  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 There is muscle present in the specimen.   We have discussed treatment options previously at my office and he is interested in intravesical BCG.  I have recommended repeat TURBT to ensure that this cancer is not muscle invasive. He presents today for that surgery.  He denies recent gross hematuria, fever, chills, nausea, or emesis.  He has some terminal dysuria which continues to improve.  At his last TURBT, the left ureter as it was not visible, therefore we will attempt to identify it and obtain a left retrograde pyelogram.  Today we plan on cystoscopy, TURBT, left retrograde pyelogram, left ureteroscopy, possible ureteral biopsy and laser ablation of lesion, possible left ureteral stent placement. We discussed the risks, benefits, alternatives, and likelihood of achieving goals. He wishes to proceed.   Past Medical History Problems  1. History of  Anxiety (Symptom) 300.00 2. History of  Esophageal Reflux 530.81 3. History of  Hypercholesterolemia 272.0 4. History of  Hypertension 401.9 5. History of  Stroke Syndrome 436  Surgical History Problems  1. History of  Cystoscopy With Fulguration Large Lesion (Over 5cm) 2. History of  Leg Repair 3. History of  Lithotripsy 4. History of  Shoulder Surgery  Current Meds 1. AmLODIPine Besylate 10 MG Oral Tablet; Therapy: 07Sep2011 to 2. Aspirin 325 MG Oral Tablet; Therapy: (Recorded:27Jul2012) to 3. CloNIDine HCl 0.1 MG Oral Tablet; Therapy: 07Sep2011 to 4. Doxazosin Mesylate 4 MG Oral Tablet; Therapy: 07Sep2011 to 5. Finasteride 5 MG Oral Tablet; Therapy: 07Sep2011 to 6. Lisinopril 40 MG Oral Tablet; Therapy:  07Sep2011 to 7. Multi-Vitamin TABS; Therapy: (Recorded:27Jul2012) to 8. Omeprazole 20 MG Oral Capsule Delayed Release; Therapy: 07Sep2011 to 9. Pravastatin Sodium 40 MG Oral Tablet; Therapy: 07Sep2011 to 10. TraMADol HCl 50 MG Oral Tablet; Therapy: 07Sep2011 to  Allergies Medication  1. Lyrica CAPS 2. Penicillins  Family History Problems  1. Paternal history of  Death In The Family Father 2. Maternal history of  Death In The Family Mother mother passed @ age 83unknown 56. Family history of  Family Health Status Number Of Children 1 son1 daughter  Social History Problems  1. Caffeine Use 2 drinks daily 2. Marital History - Single 3. Occupation: disabled 4. Tobacco Use 305.1 smokes 1 pack dailysmoking for 10-12 years Denied  5. History of  Alcohol Use  Review of Systems Constitutional, skin, eye, otolaryngeal, hematologic/lymphatic, cardiovascular, pulmonary, endocrine, musculoskeletal, gastrointestinal, neurological and psychiatric system(s) were reviewed and pertinent findings if present are noted.    Physical Exam Constitutional: Well nourished and well developed.  ENT:. The ears and nose are normal in appearance. The oropharynx is normal.  Neck: The appearance of the neck is normal and no neck mass is present.  Pulmonary: No respiratory distress and normal respiratory rhythm and effort.  Cardiovascular: Heart rate and rhythm are normal . No peripheral edema.  Abdomen: The abdomen is soft and nontender. No masses are palpated. The abdomen is no rebound. No CVA tenderness.  Lymphatics: The posterior cervical and supraclavicular nodes are not enlarged or tender.  Skin: Normal skin turgor and no visible rash.  Neuro/Psych:. Mood and affect are appropriate. Left-sided hemiparesis.    Assessment Bladder  Cancer  Plan  Proceed to the operating room for cystoscopy, bladder biopsy, transurethral resection of bladder tumor, left retrograde pyelogram, possible left ureteroscopy  with biopsy and laser ablation of tumor, possible left ureteral stent placement.

## 2011-08-21 NOTE — Anesthesia Preprocedure Evaluation (Addendum)
Anesthesia Evaluation  Patient identified by MRN, date of birth, ID band Patient awake    Reviewed: Allergy & Precautions, H&P , NPO status , Patient's Chart, lab work & pertinent test results  Airway Mallampati: II TM Distance: >3 FB Neck ROM: Full    Dental No notable dental hx.    Pulmonary neg pulmonary ROS, Current Smoker,  clear to auscultation  Pulmonary exam normal       Cardiovascular hypertension, Pt. on medications neg cardio ROS - dysrhythmias Regular Normal    Neuro/Psych CVA (l hemiparesis partial), Residual Symptoms Negative Neurological ROS  Negative Psych ROS   GI/Hepatic negative GI ROS, Neg liver ROS,   Endo/Other  Negative Endocrine ROS  Renal/GU negative Renal ROS  Genitourinary negative   Musculoskeletal negative musculoskeletal ROS (+)   Abdominal   Peds negative pediatric ROS (+)  Hematology negative hematology ROS (+)   Anesthesia Other Findings   Reproductive/Obstetrics negative OB ROS                           Anesthesia Physical Anesthesia Plan  ASA: III  Anesthesia Plan: General   Post-op Pain Management:    Induction: Intravenous  Airway Management Planned: LMA  Additional Equipment:   Intra-op Plan:   Post-operative Plan: Extubation in OR  Informed Consent: I have reviewed the patients History and Physical, chart, labs and discussed the procedure including the risks, benefits and alternatives for the proposed anesthesia with the patient or authorized representative who has indicated his/her understanding and acceptance.   Dental advisory given  Plan Discussed with: CRNA  Anesthesia Plan Comments:        Anesthesia Quick Evaluation

## 2011-08-21 NOTE — Brief Op Note (Signed)
08/21/2011  3:02 PM  PATIENT:  Hillery Jacks  60 y.o. male  PRE-OPERATIVE DIAGNOSIS:  Bladder cancer  POST-OPERATIVE DIAGNOSIS:  bladder cancer  PROCEDURE:  Procedure(s): CYSTOSCOPY  BLADDER BIOPSY TRANSURETHRAL RESECTION OF BLADDER TUMOR (<0.5 cm)  SURGEON:  Surgeon(s): Milford Cage, MD  PHYSICIAN ASSISTANT: None  ASSISTANTS: none   ANESTHESIA:   local and general  EBL:  Total I/O In: 1000 [I.V.:1000] Out: -   BLOOD ADMINISTERED:none  DRAINS: Urinary Catheter (Foley)   LOCAL MEDICATIONS USED:  OTHER 10 mL of lidocaine jelly per urethra  SPECIMEN:  Source of Specimen:  Bladder  DISPOSITION OF SPECIMEN:  PATHOLOGY  COUNTS:  YES  TOURNIQUET:  * No tourniquets in log *  DICTATION: .Other Dictation: Dictation Number [id number]  PLAN OF CARE: Discharge to home after PACU  PATIENT DISPOSITION:  PACU - hemodynamically stable.   Delay start of Pharmacological VTE agent (>24hrs) due to surgical blood loss or risk of bleeding:  Yes

## 2011-08-21 NOTE — Anesthesia Postprocedure Evaluation (Signed)
  Anesthesia Post-op Note  Patient: William Collier  Procedure(s) Performed:  CYSTOSCOPY WITH RETROGRADE PYELOGRAM/URETERAL STENT PLACEMENT; CYSTOSCOPY WITH BIOPSY; TRANSURETHRAL RESECTION OF BLADDER TUMOR (TURBT) - need PK gyrus  Patient Location: PACU  Anesthesia Type: General  Level of Consciousness: awake and alert   Airway and Oxygen Therapy: Patient Spontanous Breathing  Post-op Pain: mild  Post-op Assessment: Post-op Vital signs reviewed, Patient's Cardiovascular Status Stable, Respiratory Function Stable, Patent Airway and No signs of Nausea or vomiting  Post-op Vital Signs: stable  Complications: No apparent anesthesia complications

## 2011-08-21 NOTE — Transfer of Care (Signed)
Immediate Anesthesia Transfer of Care Note  Patient: William Collier  Procedure(s) Performed:  CYSTOSCOPY WITH RETROGRADE PYELOGRAM/URETERAL STENT PLACEMENT; CYSTOSCOPY WITH BIOPSY; TRANSURETHRAL RESECTION OF BLADDER TUMOR (TURBT) - need PK gyrus  Patient Location: PACU  Anesthesia Type: General  Level of Consciousness: awake, alert  and oriented  Airway & Oxygen Therapy: Patient Spontanous Breathing and Patient connected to face mask oxygen  Post-op Assessment: Report given to PACU RN and Post -op Vital signs reviewed and stable  Post vital signs: Reviewed and stable  Complications: No apparent anesthesia complications

## 2011-08-21 NOTE — Op Note (Signed)
NAME:  William Collier, William Collier NO.:  [id number]  MEDICAL RECORD NO.:  [id number]  LOCATION:  [facility]                         FACILITY:  [facility]  PHYSICIAN:  Natalia Leatherwood, MD    DATE OF BIRTH:  1951/03/25  DATE OF PROCEDURE:  08/21/2011 DATE OF DISCHARGE:                              OPERATIVE REPORT   SURGEON:  Natalia Leatherwood, MD  ASSISTANT:  None.  PREOPERATIVE DIAGNOSIS:  Bladder cancer.  POSTOPERATIVE DIAGNOSIS:  Bladder cancer.  FINDINGS:  Inability to locate left ureteral orifice.  Recurrent bladder tumor.  Widespread erythema.  SPECIMEN:  Bladder tumor sent for Pathology, 9 different sites were removed either with bladder biopsy or transurethral resection of bladder tumor.  These include: 1. Dome of the bladder. 2. Resection at 2 o'clock on the bladder neck. 3. Resection at 5 o'clock on the bladder neck. 4. 6 o'clock at the resection of the bladder neck. 5. Right lateral posterior bladder wall. 6. Left lateral bladder wall. 7. Right lateral bladder wall. 8. Right posterior bladder wall #2. 9. Adjacent to previous resection site on the right bladder wall.  PROCEDURES PERFORMED: 1. Cystoscopy. 2. Panendoscopy. 3. Bladder biopsy and transurethral resection of bladder tumor,     smaller than half a cm.  ESTIMATED BLOOD LOSS:  Minimal.  COMPLICATIONS:  None.  HISTORY OF PRESENT ILLNESS:  This is a 60 year old male, who has recent history of transurethral resection of bladder tumor.  He was found to have high-grade non-muscle invasive disease.  Because of this, I have recommended re-resection to ensure that he is not upstaged to differentiate between type of treatment needed.  He presents today for that resection.  Also, at his last resection, the left ureteral orifice was not identified due to it being obscured by bladder tumor.  After informed consent was obtained, the patient was taken to operating room, was placed in supine position.  IV  antibiotics were infused and general anesthesia was induced.  He was then placed in a dorsal lithotomy position where his neurovascular pressure points were padded appropriately.  A time-out was performed in which the correct patient, surgical site, and procedure were identified and agreed upon by the team.  His genitals were then prepped and draped in usual fashion. Following this, a rigid cystoscope was advanced through the urethra into the bladder.  The bladder was evaluated with a 30-degree and 70-degree lens.  The left ureteral orifice could not be identified again.  There was noted to be widespread erythema throughout the bladder.  There was a new papillary tumor on the dome of the bladder and there was a concerning areas of CIS throughout the bladder.  Next, a gyrus resectoscope was placed with the visual obturator through the urethra and into the bladder.  The tumor at the bladder dome was resected and then fulgurated to maintain hemostasis.  Resection at the bladder neck was then taken with the gyrus resectoscope at 2 o'clock, 5 o'clock, and 6 o'clock and sent for Pathology separately.  Cold cup biopsy forceps were then used to biopsy the areas of erythema at the right lateral posterior bladder, the left lateral bladder wall, the right lateral bladder wall, the  right posterior bladder wall as the second biopsy in that site and then finally next to the previous resection site on the right.  After biopsies carried out, fulguration was carried out around the sites.  Resection of the bladder neck with the gyrus resectoscope did reveal an area that was suspicious for possibility of being the left ureteral orifice.  This was cannulated with a 5-French open-ended Pollock catheter and retrograde pyelogram was shot.  It appeared that this could be a very tortuous ureter and a angled-tip glidewire was attempted to be passed, but this was unsuccessful.  The 5-French open- ended Pollock  catheter was advanced over this wire, and then retrograde pyelogram was again shot.  It appeared that this was simply a retroperitoneal area.  After this was done, the hemostasis was maintained and then a 22-French 3-way Foley catheter was placed after 10 cc of lidocaine jelly were placed into the patient's urethra.  30 cc of sterile water were placed into the balloon and the catheter was irrigated clear.  This was hooked to close drainage.  The patient was placed back in supine position.  Anesthesia was reversed and taken to PACU in stable condition.  We will await the results of the biopsy before further treatment recommendations are made.          ______________________________ Natalia Leatherwood, MD     DW/MEDQ  D:  08/21/2011  T:  08/21/2011  Job:  [id number]

## 2011-08-23 ENCOUNTER — Encounter (HOSPITAL_COMMUNITY): Payer: Self-pay | Admitting: Urology

## 2011-09-05 ENCOUNTER — Other Ambulatory Visit (HOSPITAL_COMMUNITY): Payer: Self-pay | Admitting: Urology

## 2011-09-05 DIAGNOSIS — C801 Malignant (primary) neoplasm, unspecified: Secondary | ICD-10-CM

## 2011-09-09 ENCOUNTER — Ambulatory Visit (HOSPITAL_COMMUNITY)
Admission: RE | Admit: 2011-09-09 | Discharge: 2011-09-09 | Disposition: A | Discharge status: Home / Self Care | Payer: Medicare Other | Source: Ambulatory Visit | Attending: Urology | Admitting: Urology

## 2011-09-09 DIAGNOSIS — C801 Malignant (primary) neoplasm, unspecified: Secondary | ICD-10-CM

## 2011-09-09 DIAGNOSIS — C679 Malignant neoplasm of bladder, unspecified: Secondary | ICD-10-CM | POA: Insufficient documentation

## 2011-09-09 DIAGNOSIS — N400 Enlarged prostate without lower urinary tract symptoms: Secondary | ICD-10-CM | POA: Insufficient documentation

## 2011-09-09 DIAGNOSIS — K7689 Other specified diseases of liver: Secondary | ICD-10-CM | POA: Insufficient documentation

## 2011-09-09 DIAGNOSIS — M948X9 Other specified disorders of cartilage, unspecified sites: Secondary | ICD-10-CM | POA: Insufficient documentation

## 2011-09-09 DIAGNOSIS — Q619 Cystic kidney disease, unspecified: Secondary | ICD-10-CM | POA: Insufficient documentation

## 2011-09-09 DIAGNOSIS — N289 Disorder of kidney and ureter, unspecified: Secondary | ICD-10-CM | POA: Insufficient documentation

## 2011-09-10 ENCOUNTER — Other Ambulatory Visit: Payer: Self-pay | Admitting: Urology

## 2011-09-25 ENCOUNTER — Encounter (HOSPITAL_COMMUNITY): Payer: Self-pay

## 2011-10-04 ENCOUNTER — Encounter (HOSPITAL_COMMUNITY)
Admission: RE | Admit: 2011-10-04 | Discharge: 2011-10-04 | Disposition: A | Discharge status: Home / Self Care | Payer: Medicare Other | Source: Ambulatory Visit | Attending: Urology | Admitting: Urology

## 2011-10-04 ENCOUNTER — Encounter (HOSPITAL_COMMUNITY): Payer: Self-pay

## 2011-10-04 HISTORY — DX: Chronic kidney disease, unspecified: N18.9

## 2011-10-04 LAB — BASIC METABOLIC PANEL
CO2: 22 mEq/L (ref 19–32)
Chloride: 107 mEq/L (ref 96–112)
Glucose, Bld: 117 mg/dL — ABNORMAL HIGH (ref 70–99)
Potassium: 3.7 mEq/L (ref 3.5–5.1)
Sodium: 142 mEq/L (ref 135–145)

## 2011-10-04 LAB — SURGICAL PCR SCREEN
MRSA, PCR: NEGATIVE
Staphylococcus aureus: NEGATIVE

## 2011-10-04 LAB — ABO/RH: ABO/RH(D): A POS

## 2011-10-04 LAB — CBC
Hemoglobin: 14.1 g/dL (ref 13.0–17.0)
RBC: 4.74 MIL/uL (ref 4.22–5.81)
WBC: 6.2 10*3/uL (ref 4.0–10.5)

## 2011-10-04 NOTE — Consult Note (Signed)
WOC ostomy consult  Stoma type/location: Pre-operative stoma site marking for ileal conduit creation on Monday, February 4th Mark Location:  RMQ; 8.5cm to right of umbilicus Peristomal area assessment: The marked area is free of creases and is both above the line where patient wears his pants and to the right of the small crease at the umbilicus.  Patient has a short lower abdomen and cannot see anything below the marking made today.  NB:  Patient has hemiparesis on the left and lists slightly toward that side.  The left UE assists in gross maneuvers only.  Patient accompanied by friend today who will likely be CG for pouch changes, but I will confirm this when in house.  My goal will be to have patient independent in emptying pouch prior to discharge home. Patient will likely require HHA services post discharge. Education regarding procedure provided, patient understands role of WOC Nurse in post-operative care/teaching. Patient understands that a pouch will be applied and will collect urine, needing to be emptied when 1/2 full.  Sample pouches provided and an educational booklet sent home with patient and friend.  Loraine Leriche made using surgical site marking pen and mark covered by a thin film transparent dressing (Tegaderm).  I will follow this patient with you in the post-operative phase. Thanks, Ladona Mow, MSN, RN, [facility], [place], CWOCN [phone])

## 2011-10-04 NOTE — Patient Instructions (Signed)
[name]  10/04/2011   Your procedure is scheduled on:  Monday 10/07/2011  Report to [facility] Stay Center at 0530 AM.  Call this number if you have problems the morning of surgery: [phone]   Remember:Follow Bowel prep instructions from Dr. Hilario Quarry office on Sun. 10/06/2011 and drink clear liquids all day on Sunday!   Do not eat food:After Midnight.on 10/05/2011  May have clear liquids:until Midnight .  Clear liquids include soda, tea, black coffee, apple or grape juice, broth.  Take these medicines the morning of surgery with A SIP OF WATER: Norvasc, Clonidine   Do not wear jewelry, make-up or nail polish.  Do not wear lotions, powders, or perfumes.  Do not shave 48 hours prior to surgery.(women only-shaving legs)  Do not bring valuables to the hospital.  Contacts, dentures or bridgework may not be worn into surgery.  Leave suitcase in the car. After surgery it may be brought to your room.  For patients admitted to the hospital, checkout time is 11:00 AM the day of discharge.   Patients discharged the day of surgery will not be allowed to drive home.  Name and phone number of your driver:   Special Instructions: CHG Shower Use Special Wash: 1/2 bottle night before surgery and 1/2 bottle morning of surgery.   Please read over the following fact sheets that you were given: MRSA Information

## 2011-10-04 NOTE — Pre-Procedure Instructions (Signed)
William Collier came to measure pt. For ileal bag-area marked with tegaderm over area-see consult note.

## 2011-10-04 NOTE — Pre-Procedure Instructions (Signed)
Abnormal lab results called to Roderic Scarce for Dr. Margarita Grizzle (abnormal glucose, creatinine, and GFR).

## 2011-10-06 NOTE — H&P (Signed)
Urology History and Physical Exam  CC: Bladder cancer  HPI:             61 year old male with a history of bladder cancer. He presents for radical cystoprostatectomy, bilateral pelvic lymphadenectomy, and creation of an ileal conduit.  We have reviewed the risks, benefits, alternatives, and likelihood of achieving his goals.    He had a TURBT 07/01/11 which showed UCC high-grade, non-muscle invasive.  Repeat TURBT 08/21/11 revealed a new papillary tumor which was high-grade non-muscle invasive. 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   He was staged with an MRI of the abdomen & pelvis 09/09/11 without contrast due to renal insufficiency.  He has stable cysts in both kidneys.  He has no hydronephrosis or hydroureter bilaterally.  He has no pelvic LAD.  He does have a sclerotic lesion in his in the left iliac bone which is stable from 6 months ago.  CMP was normal except for BUN 25 and serum Cr of 1.77, which is his baseline. CXR October 2012 was negative for metastatic disease.  He does have a significant stone burden bilaterally. The left middle pole has a 6 mm stone; the right middle pole has a 10.4 mm stone in the right lower pole has a 5 mm stone. This is right mid pole stone has been treated with shockwave lithotripsy in the fall of 2012.   PMH: Past Medical History  Diagnosis Date  . Hypertension 08-20-11  . Dysrhythmia 08-20-11    skip beat, hx. RBBB  . GERD (gastroesophageal reflux disease) 08-20-11    controls with diet, hx. colon polyps(benign)  . Cancer 08-20-11    bladder cancer  . Arthritis   . Stroke 08-20-11    '08-Lt. sided weakness with residual > lt. arm.  . Chronic kidney disease     PSH: Past Surgical History  Procedure Date  . Bladder surgery 08-20-11    07-01-11 bladder surgery, past hx. lithotripsy  . Shoulder arthrotomy 08-20-11    left shoulder with retained pins  . Knee arthroscopy 08-20-11    bil. knee  scopes-torn ligament  . Cystoscopy w/ ureteral stent placement 08/21/2011    Procedure: CYSTOSCOPY WITH RETROGRADE PYELOGRAM/URETERAL STENT PLACEMENT;  Surgeon: Milford Cage, MD;  Location: [facility];  Service: Urology;  Laterality: Left;  . Cystoscopy with biopsy 08/21/2011    Procedure: CYSTOSCOPY WITH BIOPSY;  Surgeon: Milford Cage, MD;  Location: [facility];  Service: Urology;  Laterality: N/A;  . Transurethral resection of bladder tumor 08/21/2011    Procedure: TRANSURETHRAL RESECTION OF BLADDER TUMOR (TURBT);  Surgeon: Milford Cage, MD;  Location: [facility];  Service: Urology;  Laterality: N/A;  need PK gyrus    Allergies: Allergies  Allergen Reactions  . Penicillins Other (See Comments)    HAD WHEN WAS A CHILD   . Pregabalin Swelling    Medications: No prescriptions prior to admission     Social History: History   Social History  . Marital Status: Single    Spouse Name: N/A    Number of Children: N/A  . Years of Education: N/A   Occupational History  . Not on file.   Social History Main Topics  . Smoking status: Current Some Day Smoker -- 1.0 packs/day for 40 years    Types: Cigarettes  . Smokeless tobacco: Not on file  . Alcohol Use: No  . Drug Use: 3 per week    Special: Marijuana  . Sexually Active:  Yes   Other Topics Concern  . Not on file   Social History Narrative  . No narrative on file    Family History: No family history on file.  Review of Systems: Positive: Left sided weakness (baseline), left facial droop. Negative: Chest pain, SOB.  A further 10 point review of systems was negative except what is listed in the HPI.  Physical Exam:  General: No acute distress.  Awake. Head:  Normocephalic.  Atraumatic. ENT:  EOMI.  Mucous membranes moist Neck:  Supple.  No lymphadenopathy. CV:  S1 present. S2 present. Regular rate. Pulmonary: Equal effort bilaterally.  Clear to auscultation bilaterally. Abdomen: Soft.  Non-tender to  palpation. Skin:  Normal turgor.  No visible rash. Extremity: No gross deformity of bilateral upper extremities.  No gross deformity of    bilateral lower extremities. Neurologic: Alert. Appropriate mood. Left hemiparesis.   Studies:  Recent Labs  Basename 10/04/11 1115   HGB 14.1   WBC 6.2   PLT 251    Recent Labs  Basename 10/04/11 1115   NA 142   K 3.7   CL 107   CO2 22   BUN 22   CREATININE 1.81*   CALCIUM 10.0   GFRNONAA 39*   GFRAA 45*     No results found for this basename: PT:2,INR:2,APTT:2 in the last 72 hours   No components found with this basename: ABG:2    Assessment:  Bladder cancer  Plan: To OR for radical cystoprostatectomy, bilateral pelvic lymphadenectomy, creation of an ileal conduit.  Gentamicin and metronidazole due to penicillin allergy.  Stoma site marked.

## 2011-10-07 ENCOUNTER — Inpatient Hospital Stay (HOSPITAL_COMMUNITY): Payer: Medicare Other | Admitting: Anesthesiology

## 2011-10-07 ENCOUNTER — Inpatient Hospital Stay (HOSPITAL_COMMUNITY): Payer: Medicare Other

## 2011-10-07 ENCOUNTER — Inpatient Hospital Stay (HOSPITAL_COMMUNITY)
Admission: RE | Admit: 2011-10-07 | Discharge: 2011-10-15 | DRG: 654 | Disposition: A | Discharge status: Skilled Nursing Facility | Payer: Medicare Other | Source: Ambulatory Visit | Attending: Urology | Admitting: Urology

## 2011-10-07 ENCOUNTER — Other Ambulatory Visit: Payer: Self-pay | Admitting: Urology

## 2011-10-07 ENCOUNTER — Encounter (HOSPITAL_COMMUNITY)
Admission: RE | Disposition: A | Discharge status: Skilled Nursing Facility | Payer: Self-pay | Source: Ambulatory Visit | Attending: Urology

## 2011-10-07 ENCOUNTER — Encounter (HOSPITAL_COMMUNITY): Payer: Self-pay | Admitting: Anesthesiology

## 2011-10-07 ENCOUNTER — Encounter (HOSPITAL_COMMUNITY): Payer: Self-pay | Admitting: *Deleted

## 2011-10-07 DIAGNOSIS — N189 Chronic kidney disease, unspecified: Secondary | ICD-10-CM | POA: Diagnosis present

## 2011-10-07 DIAGNOSIS — I69959 Hemiplegia and hemiparesis following unspecified cerebrovascular disease affecting unspecified side: Secondary | ICD-10-CM

## 2011-10-07 DIAGNOSIS — M948X9 Other specified disorders of cartilage, unspecified sites: Secondary | ICD-10-CM | POA: Diagnosis present

## 2011-10-07 DIAGNOSIS — R000 Tachycardia, unspecified: Secondary | ICD-10-CM | POA: Diagnosis not present

## 2011-10-07 DIAGNOSIS — Z880 Allergy status to penicillin: Secondary | ICD-10-CM

## 2011-10-07 DIAGNOSIS — C61 Malignant neoplasm of prostate: Secondary | ICD-10-CM | POA: Diagnosis present

## 2011-10-07 DIAGNOSIS — K219 Gastro-esophageal reflux disease without esophagitis: Secondary | ICD-10-CM | POA: Diagnosis present

## 2011-10-07 DIAGNOSIS — C679 Malignant neoplasm of bladder, unspecified: Secondary | ICD-10-CM

## 2011-10-07 DIAGNOSIS — T8131XA Disruption of external operation (surgical) wound, not elsewhere classified, initial encounter: Secondary | ICD-10-CM | POA: Diagnosis not present

## 2011-10-07 DIAGNOSIS — Y833 Surgical operation with formation of external stoma as the cause of abnormal reaction of the patient, or of later complication, without mention of misadventure at the time of the procedure: Secondary | ICD-10-CM | POA: Diagnosis not present

## 2011-10-07 DIAGNOSIS — Y9210 Unspecified residential institution as the place of occurrence of the external cause: Secondary | ICD-10-CM | POA: Diagnosis not present

## 2011-10-07 DIAGNOSIS — Q619 Cystic kidney disease, unspecified: Secondary | ICD-10-CM

## 2011-10-07 DIAGNOSIS — I129 Hypertensive chronic kidney disease with stage 1 through stage 4 chronic kidney disease, or unspecified chronic kidney disease: Secondary | ICD-10-CM | POA: Diagnosis present

## 2011-10-07 DIAGNOSIS — F17200 Nicotine dependence, unspecified, uncomplicated: Secondary | ICD-10-CM | POA: Diagnosis present

## 2011-10-07 DIAGNOSIS — N200 Calculus of kidney: Secondary | ICD-10-CM | POA: Diagnosis present

## 2011-10-07 DIAGNOSIS — R5381 Other malaise: Secondary | ICD-10-CM | POA: Diagnosis not present

## 2011-10-07 HISTORY — PX: CYSTECTOMY: SHX5119

## 2011-10-07 HISTORY — PX: LYMPH NODE DISSECTION: SHX5087

## 2011-10-07 HISTORY — PX: PROSTATECTOMY: SHX69

## 2011-10-07 LAB — CBC
Hemoglobin: 11.5 g/dL — ABNORMAL LOW (ref 13.0–17.0)
MCH: 29.3 pg (ref 26.0–34.0)
MCV: 88.5 fL (ref 78.0–100.0)
Platelets: 223 10*3/uL (ref 150–400)
RBC: 3.92 MIL/uL — ABNORMAL LOW (ref 4.22–5.81)
WBC: 13.7 10*3/uL — ABNORMAL HIGH (ref 4.0–10.5)

## 2011-10-07 LAB — BLOOD GAS, ARTERIAL
Acid-base deficit: 0.8 mmol/L (ref 0.0–2.0)
FIO2: 0.7 %
pCO2 arterial: 38.4 mmHg (ref 35.0–45.0)
pO2, Arterial: 214 mmHg — ABNORMAL HIGH (ref 80.0–100.0)

## 2011-10-07 LAB — BASIC METABOLIC PANEL
CO2: 21 mEq/L (ref 19–32)
Calcium: 8 mg/dL — ABNORMAL LOW (ref 8.4–10.5)
Chloride: 111 mEq/L (ref 96–112)
Creatinine, Ser: 1.49 mg/dL — ABNORMAL HIGH (ref 0.50–1.35)
Glucose, Bld: 174 mg/dL — ABNORMAL HIGH (ref 70–99)

## 2011-10-07 SURGERY — CYSTECTOMY, COMPLETE
Anesthesia: General | Site: Pelvis | Wound class: Clean Contaminated

## 2011-10-07 MED ORDER — PROPOFOL 10 MG/ML IV EMUL
INTRAVENOUS | Status: DC | PRN
  Administered 2011-10-07: 200 mg via INTRAVENOUS

## 2011-10-07 MED ORDER — ONDANSETRON HCL 4 MG/2ML IJ SOLN: 4.0000 mg | INTRAMUSCULAR | Status: DC | PRN

## 2011-10-07 MED ORDER — METRONIDAZOLE IN NACL 5-0.79 MG/ML-% IV SOLN
500.0000 mg | Freq: Three times a day (TID) | INTRAVENOUS | Status: AC
  Administered 2011-10-07 – 2011-10-08 (×3): 500 mg via INTRAVENOUS
  Filled 2011-10-07 (×5): qty 100

## 2011-10-07 MED ORDER — SODIUM CHLORIDE 0.9 % IV SOLN
INTRAVENOUS | Status: DC | PRN
  Administered 2011-10-07 (×3): via INTRAVENOUS

## 2011-10-07 MED ORDER — CLONIDINE HCL 0.1 MG PO TABS
0.1000 mg | ORAL_TABLET | Freq: Two times a day (BID) | ORAL | Status: DC
  Administered 2011-10-07 – 2011-10-15 (×16): 0.1 mg via ORAL
  Filled 2011-10-07 (×17): qty 1

## 2011-10-07 MED ORDER — SODIUM CHLORIDE 0.9 % IV SOLN: INTRAVENOUS | Status: DC

## 2011-10-07 MED ORDER — GENTAMICIN IN SALINE 1.6-0.9 MG/ML-% IV SOLN
INTRAVENOUS | Status: AC
  Filled 2011-10-07: qty 50

## 2011-10-07 MED ORDER — MEPERIDINE HCL 50 MG/ML IJ SOLN: 6.2500 mg | INTRAMUSCULAR | Status: DC | PRN

## 2011-10-07 MED ORDER — GLYCOPYRROLATE 0.2 MG/ML IJ SOLN
INTRAMUSCULAR | Status: DC | PRN
  Administered 2011-10-07: .6 mg via INTRAVENOUS

## 2011-10-07 MED ORDER — OXYCODONE HCL 5 MG PO TABS
5.0000 mg | ORAL_TABLET | ORAL | Status: DC | PRN
  Administered 2011-10-10: 5 mg via ORAL
  Filled 2011-10-07: qty 1

## 2011-10-07 MED ORDER — BUPIVACAINE LIPOSOME 1.3 % IJ SUSP
20.0000 mL | Freq: Once | INTRAMUSCULAR | Status: DC
  Filled 2011-10-07: qty 20

## 2011-10-07 MED ORDER — 0.9 % SODIUM CHLORIDE (POUR BTL) OPTIME
TOPICAL | Status: DC | PRN
  Administered 2011-10-07: 1000 mL

## 2011-10-07 MED ORDER — AMLODIPINE BESYLATE 10 MG PO TABS
10.0000 mg | ORAL_TABLET | Freq: Every day | ORAL | Status: DC
  Administered 2011-10-08 – 2011-10-15 (×8): 10 mg via ORAL
  Filled 2011-10-07 (×8): qty 1

## 2011-10-07 MED ORDER — SIMVASTATIN 20 MG PO TABS
20.0000 mg | ORAL_TABLET | Freq: Every day | ORAL | Status: DC
  Administered 2011-10-07 – 2011-10-14 (×8): 20 mg via ORAL
  Filled 2011-10-07 (×9): qty 1

## 2011-10-07 MED ORDER — HEMOSTATIC AGENTS (NO CHARGE) OPTIME
TOPICAL | Status: DC | PRN
  Administered 2011-10-07: 1 via TOPICAL

## 2011-10-07 MED ORDER — ACETAMINOPHEN 325 MG PO TABS: 650.0000 mg | ORAL_TABLET | ORAL | Status: DC | PRN

## 2011-10-07 MED ORDER — BUPIVACAINE LIPOSOME 1.3 % IJ SUSP
INTRAMUSCULAR | Status: DC | PRN
  Administered 2011-10-07: 20 mL

## 2011-10-07 MED ORDER — ONDANSETRON HCL 4 MG/2ML IJ SOLN
INTRAMUSCULAR | Status: DC | PRN
  Administered 2011-10-07: 4 mg via INTRAVENOUS

## 2011-10-07 MED ORDER — ACETAMINOPHEN 10 MG/ML IV SOLN
1000.0000 mg | Freq: Four times a day (QID) | INTRAVENOUS | Status: AC
  Administered 2011-10-07 – 2011-10-08 (×4): 1000 mg via INTRAVENOUS
  Filled 2011-10-07 (×6): qty 100

## 2011-10-07 MED ORDER — HEPARIN SODIUM (PORCINE) 5000 UNIT/ML IJ SOLN
5000.0000 [IU] | Freq: Three times a day (TID) | INTRAMUSCULAR | Status: DC
  Administered 2011-10-07 – 2011-10-14 (×20): 5000 [IU] via SUBCUTANEOUS
  Filled 2011-10-07 (×24): qty 1

## 2011-10-07 MED ORDER — METRONIDAZOLE IVPB CUSTOM: 1.0000 g | Freq: Once | INTRAVENOUS | Status: DC

## 2011-10-07 MED ORDER — LACTATED RINGERS IV SOLN
INTRAVENOUS | Status: DC | PRN
  Administered 2011-10-07: 07:00:00 via INTRAVENOUS

## 2011-10-07 MED ORDER — HEPARIN SODIUM (PORCINE) 5000 UNIT/ML IJ SOLN
5000.0000 [IU] | INTRAMUSCULAR | Status: AC
  Administered 2011-10-07: 5000 [IU] via SUBCUTANEOUS

## 2011-10-07 MED ORDER — NEOSTIGMINE METHYLSULFATE 1 MG/ML IJ SOLN
INTRAMUSCULAR | Status: DC | PRN
  Administered 2011-10-07: 4 mg via INTRAVENOUS

## 2011-10-07 MED ORDER — CISATRACURIUM BESYLATE 2 MG/ML IV SOLN
INTRAVENOUS | Status: DC | PRN
  Administered 2011-10-07: 3 mg via INTRAVENOUS
  Administered 2011-10-07: 5 mg via INTRAVENOUS
  Administered 2011-10-07: 10 mg via INTRAVENOUS
  Administered 2011-10-07: 3 mg via INTRAVENOUS
  Administered 2011-10-07: 6 mg via INTRAVENOUS
  Administered 2011-10-07: 3 mg via INTRAVENOUS

## 2011-10-07 MED ORDER — SODIUM CHLORIDE 0.9 % IV SOLN
INTRAVENOUS | Status: DC
  Administered 2011-10-07: 16:00:00 via INTRAVENOUS
  Administered 2011-10-08: 150 mL/h via INTRAVENOUS

## 2011-10-07 MED ORDER — HYDROMORPHONE HCL PF 1 MG/ML IJ SOLN
0.2500 mg | INTRAMUSCULAR | Status: DC | PRN
  Administered 2011-10-07 (×2): 0.5 mg via INTRAVENOUS

## 2011-10-07 MED ORDER — PROMETHAZINE HCL 25 MG/ML IJ SOLN: 6.2500 mg | INTRAMUSCULAR | Status: DC | PRN

## 2011-10-07 MED ORDER — METRONIDAZOLE IN NACL 5-0.79 MG/ML-% IV SOLN
INTRAVENOUS | Status: AC
  Filled 2011-10-07: qty 100

## 2011-10-07 MED ORDER — PANTOPRAZOLE SODIUM 40 MG IV SOLR
40.0000 mg | Freq: Every day | INTRAVENOUS | Status: DC
  Administered 2011-10-07 – 2011-10-11 (×5): 40 mg via INTRAVENOUS
  Filled 2011-10-07 (×6): qty 40

## 2011-10-07 MED ORDER — HYDROMORPHONE HCL PF 1 MG/ML IJ SOLN
INTRAMUSCULAR | Status: AC
  Filled 2011-10-07: qty 1

## 2011-10-07 MED ORDER — MORPHINE SULFATE 2 MG/ML IJ SOLN
2.0000 mg | INTRAMUSCULAR | Status: DC | PRN
  Administered 2011-10-07: 2 mg via INTRAVENOUS
  Administered 2011-10-07: 4 mg via INTRAVENOUS
  Administered 2011-10-08: 2 mg via INTRAVENOUS
  Administered 2011-10-08: 4 mg via INTRAVENOUS
  Administered 2011-10-08 – 2011-10-14 (×21): 2 mg via INTRAVENOUS
  Filled 2011-10-07 (×2): qty 1
  Filled 2011-10-07: qty 2
  Filled 2011-10-07 (×17): qty 1
  Filled 2011-10-07: qty 2
  Filled 2011-10-07 (×4): qty 1
  Filled 2011-10-07: qty 2
  Filled 2011-10-07: qty 1

## 2011-10-07 MED ORDER — FENTANYL CITRATE 0.05 MG/ML IJ SOLN
INTRAMUSCULAR | Status: DC | PRN
  Administered 2011-10-07 (×3): 50 ug via INTRAVENOUS
  Administered 2011-10-07: 100 ug via INTRAVENOUS
  Administered 2011-10-07 (×2): 50 ug via INTRAVENOUS

## 2011-10-07 MED ORDER — METRONIDAZOLE IVPB CUSTOM
1.0000 g | Freq: Once | INTRAVENOUS | Status: AC
  Administered 2011-10-07: 1 g via INTRAVENOUS

## 2011-10-07 MED ORDER — GENTAMICIN SULFATE 40 MG/ML IJ SOLN
400.0000 mg | Freq: Once | INTRAVENOUS | Status: AC
  Administered 2011-10-07: 400 mg via INTRAVENOUS
  Filled 2011-10-07 (×2): qty 10

## 2011-10-07 MED ORDER — GENTAMICIN IN SALINE 1.6-0.9 MG/ML-% IV SOLN
80.0000 mg | INTRAVENOUS | Status: AC
  Administered 2011-10-07: 80 mg via INTRAVENOUS

## 2011-10-07 SURGICAL SUPPLY — 91 items
ADAPTER GOLDBERG URETERAL (ADAPTER) IMPLANT
ADPR CATH 15X14FR FL DRN BG (ADAPTER)
BLADE EXTENDED COATED 6.5IN (ELECTRODE) ×3 IMPLANT
BLADE HEX COATED 2.75 (ELECTRODE) ×3 IMPLANT
CANISTER SUCTION 2500CC (MISCELLANEOUS) ×3 IMPLANT
CATH FOLEY 2WAY SLVR  5CC 16FR (CATHETERS) ×1
CATH FOLEY 2WAY SLVR 30CC 24FR (CATHETERS) IMPLANT
CATH FOLEY 2WAY SLVR 5CC 16FR (CATHETERS) ×2 IMPLANT
CATH ROBINSON RED A/P 20FR (CATHETERS) IMPLANT
CATH SIMPLASTIC 24 30ML (CATHETERS) IMPLANT
CHLORAPREP W/TINT 26ML (MISCELLANEOUS) ×3 IMPLANT
CLIP LIGATING HEM O LOK PURPLE (MISCELLANEOUS) ×6 IMPLANT
CLIP LIGATING HEMO O LOK GREEN (MISCELLANEOUS) ×3 IMPLANT
CLIP TI LARGE 6 (CLIP) ×3 IMPLANT
CLIP TI MEDIUM 6 (CLIP) ×6 IMPLANT
CLOTH BEACON ORANGE TIMEOUT ST (SAFETY) ×3 IMPLANT
COVER SURGICAL LIGHT HANDLE (MISCELLANEOUS) ×3 IMPLANT
CUTTER ECHEON FLEX ENDO 45 340 (ENDOMECHANICALS) ×3 IMPLANT
DISSECTOR ROUND CHERRY 3/8 STR (MISCELLANEOUS) ×3 IMPLANT
DRAIN CHANNEL 10F 3/8 F FF (DRAIN) ×3 IMPLANT
DRAIN CHANNEL RND F F (WOUND CARE) IMPLANT
DRAPE LAPAROSCOPIC ABDOMINAL (DRAPES) ×3 IMPLANT
DRAPE TABLE BACK 44X90 PK DISP (DRAPES) IMPLANT
DRAPE UTILITY XL STRL (DRAPES) ×6 IMPLANT
DRAPE WARM FLUID 44X44 (DRAPE) ×3 IMPLANT
DRSG TELFA 4X14 ISLAND ADH (GAUZE/BANDAGES/DRESSINGS) ×3 IMPLANT
ELECT REM PT RETURN 9FT ADLT (ELECTROSURGICAL) ×3
ELECTRODE REM PT RTRN 9FT ADLT (ELECTROSURGICAL) ×2 IMPLANT
EVACUATOR SILICONE 100CC (DRAIN) ×3 IMPLANT
GAUZE SPONGE 4X4 16PLY XRAY LF (GAUZE/BANDAGES/DRESSINGS) ×6 IMPLANT
GLOVE BIO SURGEON STRL SZ7.5 (GLOVE) ×3 IMPLANT
GLOVE BIOGEL PI IND STRL 7.0 (GLOVE) ×2 IMPLANT
GLOVE BIOGEL PI IND STRL 7.5 (GLOVE) ×2 IMPLANT
GLOVE BIOGEL PI INDICATOR 7.0 (GLOVE) ×1
GLOVE BIOGEL PI INDICATOR 7.5 (GLOVE) ×1
GLOVE ECLIPSE 7.5 STRL STRAW (GLOVE) ×3 IMPLANT
GLOVE SURG SS PI 7.0 STRL IVOR (GLOVE) ×3 IMPLANT
GOWN PREVENTION PLUS XLARGE (GOWN DISPOSABLE) ×6 IMPLANT
GOWN STRL REIN XL XLG (GOWN DISPOSABLE) ×3 IMPLANT
HEMOSTAT SURGICEL 2X14 (HEMOSTASIS) IMPLANT
KIT BASIN OR (CUSTOM PROCEDURE TRAY) ×3 IMPLANT
LABEL STERILE EO BLANK 1X3 WHT (LABEL) ×3 IMPLANT
LIGASURE IMPACT 36 18CM CVD LR (INSTRUMENTS) ×3 IMPLANT
LOOP VESSEL MAXI BLUE (MISCELLANEOUS) ×3 IMPLANT
LUBRICANT JELLY K Y 4OZ (MISCELLANEOUS) ×3 IMPLANT
NS IRRIG 1000ML POUR BTL (IV SOLUTION) ×6 IMPLANT
PACK GENERAL/GYN (CUSTOM PROCEDURE TRAY) ×3 IMPLANT
PLUG CATH AND CAP STER (CATHETERS) ×3 IMPLANT
RELOAD GREEN ECHELON 45 (STAPLE) ×3 IMPLANT
RELOAD LINEAR CUT PROX 55 BLUE (ENDOMECHANICALS) ×6 IMPLANT
RELOAD PROXIMATE 75MM BLUE (ENDOMECHANICALS) IMPLANT
SHEET LAVH (DRAPES) IMPLANT
SPONGE GAUZE 4X4 12PLY (GAUZE/BANDAGES/DRESSINGS) ×6 IMPLANT
SPONGE LAP 18X18 X RAY DECT (DISPOSABLE) ×3 IMPLANT
SPONGE LAP 4X18 X RAY DECT (DISPOSABLE) IMPLANT
STAPLER GUN LINEAR PROX 60 (STAPLE) IMPLANT
STAPLER PROXIMATE 55 BLUE (STAPLE) ×3 IMPLANT
STAPLER PROXIMATE 75MM BLUE (STAPLE) ×3 IMPLANT
STAPLER VISISTAT 35W (STAPLE) ×3 IMPLANT
STENT SINGLE 7F (STENTS) ×3 IMPLANT
SUCTION FRAZIER TIP 10 FR DISP (SUCTIONS) IMPLANT
SURGIFLO W/THROMBIN 8M KIT (HEMOSTASIS) ×3 IMPLANT
SUT CHROMIC 3 0 SH 27 (SUTURE) ×3 IMPLANT
SUT ETHILON 3 0 PS 1 (SUTURE) ×3 IMPLANT
SUT MON AB 5-0 RB1 27 (SUTURE) IMPLANT
SUT PDS AB 1 CTX 36 (SUTURE) ×3 IMPLANT
SUT PDS AB 1 TP1 96 (SUTURE) IMPLANT
SUT PDS AB 4-0 RB1 27 (SUTURE) ×12 IMPLANT
SUT SILK 0 (SUTURE) ×3
SUT SILK 0 30XBRD TIE 6 (SUTURE) ×2 IMPLANT
SUT SILK 2 0 (SUTURE) ×3
SUT SILK 2-0 30XBRD TIE 12 (SUTURE) ×2 IMPLANT
SUT SILK 3 0 SH CR/8 (SUTURE) ×6 IMPLANT
SUT VIC AB 0 CT1 27 (SUTURE)
SUT VIC AB 0 CT1 27XBRD ANTBC (SUTURE) IMPLANT
SUT VIC AB 0 UR5 27 (SUTURE) IMPLANT
SUT VIC AB 2-0 SH 27 (SUTURE) ×6
SUT VIC AB 2-0 SH 27X BRD (SUTURE) ×4 IMPLANT
SUT VIC AB 3-0 SH 18 (SUTURE) ×6 IMPLANT
SUT VIC AB 3-0 SH 27 (SUTURE) ×6
SUT VIC AB 3-0 SH 27XBRD (SUTURE) ×4 IMPLANT
SUT VIC AB 4-0 RB1 27 (SUTURE)
SUT VIC AB 4-0 RB1 27XBRD (SUTURE) IMPLANT
SUT VIC AB 5-0 RB1 27 (SUTURE) IMPLANT
SYR 30ML LL (SYRINGE) ×3 IMPLANT
SYR CONTROL 10ML LL (SYRINGE) ×3 IMPLANT
SYRINGE 10CC LL (SYRINGE) IMPLANT
SYSTEM UROSTOMY GENTLE TOUCH (WOUND CARE) ×3 IMPLANT
TOWEL OR 17X26 10 PK STRL BLUE (TOWEL DISPOSABLE) ×6 IMPLANT
URINEMETER 200ML W/220 (MISCELLANEOUS) IMPLANT
WATER STERILE IRR 1500ML POUR (IV SOLUTION) IMPLANT

## 2011-10-07 NOTE — Progress Notes (Signed)
ANTIBIOTIC CONSULT NOTE - INITIAL  Pharmacy Consult for Gentamicin Indication: post-op cystoprostatectomy/ileal conduit  Allergies  Allergen Reactions  . Penicillins Other (See Comments)    HAD WHEN WAS A CHILD   . Pregabalin Swelling    Patient Measurements: Height: 5\' 8"  (172.7 cm) Weight: 176 lb 9.4 oz (80.1 kg) IBW/kg (Calculated) : 68.4  Vital Signs: Temp: 98.2 F (36.8 C) (02/04 1600) Temp src: Oral (02/04 1600) BP: 122/83 mmHg (02/04 1410) Pulse Rate: 97  (02/04 1600) Intake/Output from previous day:   Intake/Output from this shift: Total I/O In: 3000 [I.V.:3000] Out: 855 [Urine:225; Drains:200; Stool:130; Blood:300]  Labs:  Basename 10/07/11 1231  WBC 13.7*  HGB 11.5*  PLT 223  LABCREA --  CREATININE 1.49*   Estimated Creatinine Clearance: 51 ml/min (by C-G formula based on Cr of 1.49). No results found for this basename: VANCOTROUGH:2,VANCOPEAK:2,VANCORANDOM:2,GENTTROUGH:2,GENTPEAK:2,GENTRANDOM:2,TOBRATROUGH:2,TOBRAPEAK:2,TOBRARND:2,AMIKACINPEAK:2,AMIKACINTROU:2,AMIKACIN:2, in the last 72 hours   Microbiology: Recent Results (from the past 720 hour(s))  SURGICAL PCR SCREEN     Status: Normal   Collection Time   10/04/11  9:58 AM      Component Value Range Status Comment   MRSA, PCR NEGATIVE  NEGATIVE  Final    Staphylococcus aureus NEGATIVE  NEGATIVE  Final     Medical History: Past Medical History  Diagnosis Date  . Hypertension 08-20-11  . Dysrhythmia 08-20-11    skip beat, hx. RBBB  . GERD (gastroesophageal reflux disease) 08-20-11    controls with diet, hx. colon polyps(benign)  . Cancer 08-20-11    bladder cancer  . Arthritis   . Stroke 08-20-11    '08-Lt. sided weakness with residual > lt. arm.  . Chronic kidney disease     Medications:  Anti-infectives     Start     Dose/Rate Route Frequency Ordered Stop   10/07/11 1700   gentamicin (GARAMYCIN) 400 mg in dextrose 5 % 100 mL IVPB        400 mg 110 mL/hr over 60 Minutes Intravenous   Once 10/07/11 1615     10/07/11 1630   metroNIDAZOLE (FLAGYL) IVPB 500 mg        500 mg 100 mL/hr over 60 Minutes Intravenous 3 times per day 10/07/11 1518 10/08/11 1359   10/07/11 0700   gentamicin (GARAMYCIN) IVPB 80 mg        80 mg 100 mL/hr over 30 Minutes Intravenous 30 min pre-op 10/07/11 0612 10/07/11 0737   10/07/11 0630   metroNIDAZOLE (FLAGYL) IVPB 1 g        1 g 200 mL/hr over 60 Minutes Intravenous  Once 10/07/11 0616 10/07/11 0754   10/07/11 0615   metroNIDAZOLE (FLAGYL) IVPB 1 g  Status:  Discontinued        1 g 200 mL/hr over 60 Minutes Intravenous  Once 10/07/11 0612 10/07/11 0615         Assessment: 61 yo male with bladder CA, s/p cystoprostatectomy 2/4 with formation of ileal conduit. Gentamicin ordered post-op  Goal of Therapy:  Gentamicin 5mg /kg/dose x1  Plan:  Gentamicin 400mg  IVPB x1 dose post-op Will continue q24hr dosing if needed.  Otho Bellows PharmD Pager [phone] 10/07/2011,4:43 PM

## 2011-10-07 NOTE — Brief Op Note (Signed)
10/07/2011  12:03 PM  PATIENT:  William Collier  61 y.o. male  PRE-OPERATIVE DIAGNOSIS:  BLADDER CANCER  POST-OPERATIVE DIAGNOSIS:  Bladder Cancer  PROCEDURE:  Procedure(s): Radical cystoprostatectomy, bilateral nerve sparing. Bilateral extended pelvic LYMPH NODE DISSECTION Creation of ileal conduit urinary diversion.  SURGEON:  Surgeon(s): Milford Cage, MD Marcine Matar, MD  PHYSICIAN ASSISTANT:   ASSISTANTS: none   ANESTHESIA:   general  EBL:  Total I/O In: 2100 [I.V.:2100] Out: 300 [Blood:300]  BLOOD ADMINISTERED:none  DRAINS: (1) Jackson-Pratt drain(s) with closed bulb suction in the LLQ and bilateral ureteral stents.   LOCAL MEDICATIONS USED:  OTHER exparel injection.   SPECIMEN:  Source of Specimen:  bladder, prostate, bilateral pelvic lymph nodes.  DISPOSITION OF SPECIMEN:  PATHOLOGY  COUNTS:  YES  TOURNIQUET:  * No tourniquets in log *  DICTATION: .Other Dictation: Dictation Number [id number]  PLAN OF CARE: Admit to inpatient   PATIENT DISPOSITION:  PACU - hemodynamically stable.   Delay start of Pharmacological VTE agent (>24hrs) due to surgical blood loss or risk of bleeding:  No

## 2011-10-07 NOTE — Anesthesia Postprocedure Evaluation (Signed)
  Anesthesia Post-op Note  Patient: William Collier  Procedure(s) Performed:  CYSTECTOMY COMPLETE - Radical Cystoprostatectomy Bilateral Pelvic Lymph Node Dissection Formation Of Ileal Conduit ; PROSTATECTOMY; LYMPH NODE DISSECTION  Patient Location: PACU  Anesthesia Type: General  Level of Consciousness: awake and alert   Airway and Oxygen Therapy: Patient Spontanous Breathing  Post-op Pain: mild  Post-op Assessment: Post-op Vital signs reviewed, Patient's Cardiovascular Status Stable, Respiratory Function Stable, Patent Airway and No signs of Nausea or vomiting  Post-op Vital Signs: stable  Complications: No apparent anesthesia complications

## 2011-10-07 NOTE — Transfer of Care (Signed)
Immediate Anesthesia Transfer of Care Note  Patient: William Collier  Procedure(s) Performed:  CYSTECTOMY COMPLETE - Radical Cystoprostatectomy Bilateral Pelvic Lymph Node Dissection Formation Of Ileal Conduit ; PROSTATECTOMY; LYMPH NODE DISSECTION  Patient Location: PACU  Anesthesia Type: General  Level of Consciousness: awake, sedated and patient cooperative  Airway & Oxygen Therapy: Patient Spontanous Breathing and Patient connected to face mask oxygen  Post-op Assessment: Report given to PACU RN and Post -op Vital signs reviewed and stable  Post vital signs: Reviewed and stable  Complications: No apparent anesthesia complications

## 2011-10-07 NOTE — Progress Notes (Signed)
GU  Post-Op check  Patient doing well. Pain controlled.  PE: Filed Vitals:   10/07/11 1600  BP:   Pulse:   Temp: 98.2 F (36.8 C)  Resp:    CV: Mildly tachycardic, regular rhythm Chest: Equal effort bilaterally Abd: appropriately TTP, dressings intact, JP serosanguinous. GU: Stoma pink, bilateral ureteral stents dripping urine.   A/P: Radical cystoprostatectomy today. -Continue Stepdown bed tonight.  Will reassess for transfer to floor tomorrow. -Chemoprophylaxis for DVT. -Encourage early ambulation today.

## 2011-10-07 NOTE — Anesthesia Preprocedure Evaluation (Addendum)
Anesthesia Evaluation  Patient identified by MRN, date of birth, ID band Patient awake    Reviewed: Allergy & Precautions, H&P , NPO status , Patient's Chart, lab work & pertinent test results  Airway Mallampati: II TM Distance: >3 FB Neck ROM: Full    Dental No notable dental hx. (+) Chipped, Caps and Dental Advisory Given   Pulmonary Current Smoker,  clear to auscultation  Pulmonary exam normal       Cardiovascular hypertension, Regular Normal    Neuro/Psych CVA, No Residual Symptoms Negative Neurological ROS  Negative Psych ROS   GI/Hepatic Neg liver ROS, GERD-  ,  Endo/Other  Negative Endocrine ROS  Renal/GU Renal InsufficiencyRenal disease  Genitourinary negative   Musculoskeletal negative musculoskeletal ROS (+)   Abdominal   Peds negative pediatric ROS (+)  Hematology negative hematology ROS (+)   Anesthesia Other Findings   Reproductive/Obstetrics negative OB ROS                        Anesthesia Physical Anesthesia Plan  ASA: III  Anesthesia Plan: General   Post-op Pain Management:    Induction: Intravenous  Airway Management Planned: Oral ETT  Additional Equipment: Arterial line  Intra-op Plan:   Post-operative Plan: Extubation in OR and Possible Post-op intubation/ventilation  Informed Consent: I have reviewed the patients History and Physical, chart, labs and discussed the procedure including the risks, benefits and alternatives for the proposed anesthesia with the patient or authorized representative who has indicated his/her understanding and acceptance.   Dental advisory given  Plan Discussed with: CRNA  Anesthesia Plan Comments:         Anesthesia Quick Evaluation

## 2011-10-07 NOTE — Progress Notes (Signed)
ststes compliant with bowel prep as directed by doctor.

## 2011-10-08 LAB — BASIC METABOLIC PANEL
Calcium: 8.2 mg/dL — ABNORMAL LOW (ref 8.4–10.5)
Chloride: 111 mEq/L (ref 96–112)
Creatinine, Ser: 1.78 mg/dL — ABNORMAL HIGH (ref 0.50–1.35)
GFR calc Af Amer: 46 mL/min — ABNORMAL LOW (ref >90)
GFR calc non Af Amer: 40 mL/min — ABNORMAL LOW (ref >90)

## 2011-10-08 LAB — CBC
MCH: 30.2 pg (ref 26.0–34.0)
MCHC: 33.9 g/dL (ref 30.0–36.0)
MCV: 88.9 fL (ref 78.0–100.0)
Platelets: 224 10*3/uL (ref 150–400)
RDW: 14.5 % (ref 11.5–15.5)
WBC: 19.5 10*3/uL — ABNORMAL HIGH (ref 4.0–10.5)

## 2011-10-08 MED ORDER — BISACODYL 10 MG RE SUPP
10.0000 mg | Freq: Two times a day (BID) | RECTAL | Status: DC
  Administered 2011-10-08 – 2011-10-12 (×9): 10 mg via RECTAL
  Filled 2011-10-08 (×9): qty 1

## 2011-10-08 MED ORDER — DEXTROSE-NACL 5-0.9 % IV SOLN
INTRAVENOUS | Status: DC
  Administered 2011-10-08 – 2011-10-09 (×3): via INTRAVENOUS
  Administered 2011-10-09: 150 mL via INTRAVENOUS

## 2011-10-08 NOTE — Progress Notes (Signed)
Patient doing well.  Ordered OT evaluation based upon PT recommendations.  Will also consult for inpatient rehab in the near future.  Will order labs for the morning.    Plan to transfer to floor in the morning.

## 2011-10-08 NOTE — Evaluation (Signed)
Physical Therapy Evaluation Patient Details Name: William Collier MRN: [id number] DOB: [birth year] Today's Date: 10/08/2011  Problem List:  Patient Active Problem List  Diagnoses  . HYPERLIPIDEMIA  . HYPERTENSION  . CVA  . INTERNAL HEMORRHOIDS  . DIVERTICULOSIS, COLON  . ONYCHIA AND PARONYCHIA OF TOE  . DEGENERATIVE JOINT DISEASE  . SHOULDER PAIN, LEFT  . LEG PAIN, LEFT  . BENIGN PROSTATIC HYPERTROPHY, HX OF    Past Medical History:  Past Medical History  Diagnosis Date  . Hypertension 08-20-11  . Dysrhythmia 08-20-11    skip beat, hx. RBBB  . GERD (gastroesophageal reflux disease) 08-20-11    controls with diet, hx. colon polyps(benign)  . Cancer 08-20-11    bladder cancer  . Arthritis   . Stroke 08-20-11    '08-Lt. sided weakness with residual > lt. arm.  . Chronic kidney disease    Past Surgical History:  Past Surgical History  Procedure Date  . Bladder surgery 08-20-11    07-01-11 bladder surgery, past hx. lithotripsy  . Shoulder arthrotomy 08-20-11    left shoulder with retained pins  . Knee arthroscopy 08-20-11    bil. knee scopes-torn ligament  . Cystoscopy w/ ureteral stent placement 08/21/2011    Procedure: CYSTOSCOPY WITH RETROGRADE PYELOGRAM/URETERAL STENT PLACEMENT;  Surgeon: Milford Cage, MD;  Location: [facility];  Service: Urology;  Laterality: Left;  . Cystoscopy with biopsy 08/21/2011    Procedure: CYSTOSCOPY WITH BIOPSY;  Surgeon: Milford Cage, MD;  Location: [facility];  Service: Urology;  Laterality: N/A;  . Transurethral resection of bladder tumor 08/21/2011    Procedure: TRANSURETHRAL RESECTION OF BLADDER TUMOR (TURBT);  Surgeon: Milford Cage, MD;  Location: [facility];  Service: Urology;  Laterality: N/A;  need PK gyrus    PT Assessment/Plan/Recommendation PT Assessment Clinical Impression Statement: pt admiteed for surgery related to h/o blaadder ca, h/o stroke with L side deficits, but lived alone is highly independent in an  accessible apt.  pt very motivated to participate and will benefit from PT to improve to reach a level to DC home . Rec CIR consult for inpt rehab.pt does have support system of friends and family per pt. PT Recommendation/Assessment: Patient will need skilled PT in the acute care venue PT Problem List: Decreased strength;Decreased mobility;Decreased activity tolerance;Pain Problem List Comments: pt has preexisting L sides hemiparesis with tone PT Therapy Diagnosis : Abnormality of gait;Generalized weakness;Hemiplegia non-dominant side PT Plan PT Frequency: Min 3X/week PT Treatment/Interventions: DME instruction;Gait training;Functional mobility training;Patient/family education;Therapeutic activities PT Recommendation Recommendations for Other Services: Rehab consult;OT consult Follow Up Recommendations: Inpatient Rehab Equipment Recommended: None recommended by PT PT Goals  Acute Rehab PT Goals PT Goal Formulation: With patient Time For Goal Achievement: 2 weeks Pt will go Supine/Side to Sit: with supervision PT Goal: Supine/Side to Sit - Progress: Goal set today Pt will go Sit to Supine/Side: with supervision PT Goal: Sit to Supine/Side - Progress: Goal set today Pt will go Sit to Stand: with min assist PT Goal: Sit to Stand - Progress: Goal set today Pt will go Stand to Sit: with supervision PT Goal: Stand to Sit - Progress: Goal set today Pt will Transfer Bed to Chair/Chair to Bed: with supervision PT Transfer Goal: Bed to Chair/Chair to Bed - Progress: Goal set today Pt will Ambulate: 51 - 150 feet;with min assist;with least restrictive assistive device PT Goal: Ambulate - Progress: Goal set today  PT Evaluation Precautions/Restrictions    Prior Functioning  Home Living Lives With:  Alone Receives Help From: Friend(s) Type of Home: Apartment Home Layout: One level Home Access: Level entry Bathroom Shower/Tub: Health visitor: Handicapped height Bathroom  Accessibility: Yes Home Adaptive Equipment: Straight cane;Grab bars around toilet;Grab bars in shower (L ankle brace) Prior Function Level of Independence: Independent with basic ADLs;Independent with gait Driving: Yes Cognition Cognition Arousal/Alertness: Awake/alert Overall Cognitive Status: Appears within functional limits for tasks assessed Orientation Level: Oriented X4 Sensation/Coordination Sensation Light Touch: Appears Intact Coordination Gross Motor Movements are Fluid and Coordinated: No Coordination and Movement Description: pt has increase in tone LUE/LE , pt able to initiate hip flex with incr in knee ext with efforts. Lhand in flexed finger position bu able to passisvely ext fingers Extremity Assessment RUE Assessment RUE Assessment: Within Functional Limits LUE Assessment LUE Assessment: Exceptions to WFL LUE Tone LUE Tone Comments: flexion synergy noted RLE Assessment RLE Assessment: Within Functional Limits LLE Assessment LLE Assessment: Exceptions to WFL LLE Tone LLE Tone Comments: noted more extensor tone with inversion of ankle Mobility (including Balance) Bed Mobility Bed Mobility: Yes Supine to Sit: 1: +2 Total assist;HOB elevated (Comment degrees) Supine to Sit Details (indicate cue type and reason): pt required righting assist, had incr. pain of op site of abd so assisted pt more than would be needed Transfers Transfers: Yes Sit to Stand: 1: +2 Total assist;From elevated surface;From bed;With upper extremity assist Sit to Stand Details (indicate cue type and reason): attention  to L ankle due to inversion of ankle tone, pt uses a brace which is not present..  pt=5=, pt able to reach to recliner armrest to pivot Stand to Sit: 3: Mod assist;To chair/3-in-1;With upper extremity assist Stand to Sit Details: pt did appear to have incr in trunk tone  to sit to recliner, trunk was stiff Stand Pivot Transfers: 1: +2 Total assist Stand Pivot Transfer Details  (indicate cue type and reason): pt stood then assisted to reach for aermrest, unable to utilze one side of RW, no cane available.  L ankle inversion with efforts to move to recliner.  Posture/Postural Control Posture/Postural Control: No significant limitations Exercise    End of Session PT - End of Session Activity Tolerance: Patient tolerated treatment well Patient left: in chair;with call bell in reach Nurse Communication: Mobility status for transfers General Behavior During Session: [facility] for tasks performed Cognition: [facility] for tasks performed  Rada Hay 10/08/2011, 12:41 PM

## 2011-10-08 NOTE — Op Note (Signed)
NAMELAIN, TETTERTON NO.:  [id number]  MEDICAL RECORD NO.:  [id number]  LOCATION:  1222                         FACILITY:  [facility]  PHYSICIAN:  Natalia Leatherwood, MD    DATE OF BIRTH:  Aug 16, 1951  DATE OF PROCEDURE:  10/07/2011 DATE OF DISCHARGE:                              OPERATIVE REPORT   SURGEONS:  Natalia Leatherwood, MD and Bertram Millard. Dahlstedt, MD.  PREOPERATIVE DIAGNOSIS:  Bladder cancer.  POSTOPERATIVE DIAGNOSIS:  Bladder cancer.  PROCEDURE PERFORMED:  Radical cystoprostatectomy with bilateral nerve- sparing approach, bilateral extended pelvic lymph node dissection and creation of a urinary diversion with an ileal conduit.  FINDINGS:  Negative ureteral margins bilaterally.  ESTIMATED BLOOD LOSS:  300 mL.  TRANSFUSIONS:  None.  COMPLICATIONS:  None.  DRAINS:  Jackson-Pratt drain in the left lower quadrant, and bilateral ureteral stents protruding from the ileal conduit stoma.  HISTORY OF PRESENT ILLNESS:  This is a 61 year old male who was found to have high grade non-muscle-invasive bladder cancer.  On a re-resection to ensure that he was not muscle invasive, he was found to have new papillary high-grade tumor that also was not muscle invasive, but the short interval of growth was concerning.  I discussed with the patient different treatment options including intravesical therapy versus radical cystoprostatectomy and the patient elected for a cystoprostatectomy.  He presents today for that procedure.  PROCEDURE:  The patient was marked preoperatively by the ostomy nurse. He received 5000 units of subcutaneous heparin preoperatively.  After informed consent was obtained, he was then taken to the operating room where he was placed in a supine position.  IV antibiotics were infused and general anesthesia was induced.  Hair was removed from his abdomen and genitals and then his abdomen was prepared and draped in the usual sterile fashion.  Following  this, a midline incision was made infraumbilical from the pubic bone up to 3 cm inferior to the umbilicus. Dissection was carried out through the tissue until the fascia was encountered and the fascia was entered in the midline and the rectus bodies were separated.  The space of Retzius was entered and dissected bilaterally back to the pubic bone, on to the pelvic sidewall until the external iliac veins were identified.  Following this, a Bookwalter self- retaining retractor was placed, with small Richardson retractors placed on the inferior portions of the incision of the abdominal wall.  These were padded with lap sponges.  Following this, the urachus was identified and Kochers were placed across it.  It was ligated and divided with the LigaSure device.  After this, upward traction was placed on the urachus and the peritoneum was divided in a pyramid fashion down to the vas deferens bilaterally.  After this was done, an incision was made at the pouch of Douglas between the rectum and bladder on the peritoneum. This is where both the ureters were identified.  The left ureter was identified first.  It was dissected superiorly and inferiorly.  Once it was dissected down to the base of the bladder, Hem-o-lok clips were placed across this area and then this was divided with scissors.  A ureteral margin was sent for frozen  section.  The same was done after the right ureter was identified.  It was dissected both proximally and distally, and Hem-o-lok clips were placed across it close to its entry to the bladder.  It was divided sharply with Metzenbaum scissors and a ureteral margin was sent from the right ureter.  Frozen section came back, no evidence of malignancy bilaterally.  These were then marked with 3-0 silk pop-off sutures.  Next, blunt dissection was carried out between the rectum and the bladder and between the rectum and prostate, and dissection was carried out anteriorly to the apex  of the prostate.  After this was done, attention was turned anteriorly.  The plexus of Santorini was gathered and ligated with a LigaSure.  Endopelvic fascia was cleaned off and incised bilaterally and dissected anteriorly and posteriorly.  After the levators were swept off the prostate, a #10 blade scalpel was used to make an incision on the tissue overlying the prostate and nerve-sparing approach was performed bilaterally.  These areas were swept off laterally until the dissection posteriorly was encountered.  After this was complete, the dorsal venous complex was ligated and divided with a vascular staple load.  After this was done, the urethra was divided and then the catheter was cut and pulled up through the incision.  Following this, the pedicles of the bladder were controlled with LigaSure bilaterally.  After these were divided, this completed resection of the bladder and prostate and the specimen was removed in its entirety.  The pelvis was irrigated and then packed and bilateral pelvic lymph node was performed.  Attention was first turned to the right side in which lymph tissue was dissected out from the common iliac lymph node chain, the external iliac lymph node chain, the internal iliac, and the obturator lymph nodes.  Great care was taken to skeletonize the external and internal arteries and the external vein.  The obturator nerve was identified early and maintained without injury and the genitofemoral nerve was not injured.  After this was completed, attention was turned into the left side where the same dissection was carried out making sure to identify the obturator nerve and prevent injury.  Lymph nodes were obtained from the common iliac packets, external iliac, internal iliac, and obturator packets.  After this was done, and hemostasis had been maintained with metallic clips and LigaSure, attention was turned back to the ureters.  They were dissected up proximally,  making sure to leave periureteral tissue around them.  After this was done, a hiatus behind the mesentery of the bowel was made anterior to the great vessels over the sacral promontory, large enough for 3 finger widths to reach across. The left ureter was passed through this area to the other side and there was a good gentle curve of the ureter to this area.  The ureters were then tagged and attention was turned to the bowels.  The terminal ileum was identified and 15 cm were measured from the terminal ileum to where the mesentery was found to have a good arcade.  The mesentery was incised and divided with LigaSure device and then 12 cm of ileum more proximal to this area was marked off and the mesentery was again identified and ligated and divided with the LigaSure.  The proximal and distal portions of the conduit were marked.  The conduit was placed inferiorly and the reanastomosis of the bowel was performed anteriorly. The bowel was first divided with two 55 Endo GIA stapler loads and then the  bowel was reanastomosed in a side-to-side fashion by placing silk pop-off sutures at the end of the bowels and then also at the crotch of the anastomosis.  Curved Mayo scissors were used to incise the edge of the bowel and antimesenteric borders were lined up alongside each other and a 55 Endo GIA stapler load was fired across the antimesenteric borders.  Next, 3 Allis clamps were used to grasp the remaining opening and a TA stapling device was fired across this.  Extra tissue was removed with a scalpel and then the edges were imbricated with interrupted 3-0 silk pop-off sutures.  The mesenteric window was closed with an interrupted silk suture. There was a good donut noted. Following this, attention was turned to the ileal conduit.  The ureteral anastomosis was performed in a Bricker fashion first, using a Metzenbaum scissors to cut the end of the ureter off where it had been clamped and then  spatulated with Potts scissors.  Following this, a cut was made in the serosa of the conduit and through the mucosa, an anastomosis was carried out, first with interrupted 4-0 PDS sutures in an interrupted fashion on RB 1 needle and then running sutures were placed anteriorly and posteriorly.  After this was done, the ureteral stent was placed up the left ureter and sutured into place with a 3-0 chromic suture. Anastomosis was checked for leak and there was no leak.  Attention was then turned to the right ureter.  This ureter was much smaller than the left and thinner.  The end of the ureter was cut with Metzenbaum scissors and then it was spatulated with Potts scissors.  It was attached in a Bricker fashion, the opposite sides slightly offset from the left side. Incision was made in the serosa of the conduit through the mucosa and again 4-0 PDS and RB1 was used to place two interrupted sutures and then two running sutures.  The ureteral stent was placed up the side and then sutured into place in the conduit with a 3-0 silk suture.  When the area was checked for leak, there was not an anastomotic leak, but there was a needle hole in the ureter itself.  It was felt that this could not be oversewn without causing damage and stricture of the ureter.  The posterior portion of the conduit was then retroperitonealized by taking a 2-0 Vicryl suture, and securing it in interrupted fashion in the butt end of the conduit to the right perineal free flap, which had been made from the dissection of the right ureter.  Next attention was turned to the spot that had been marked by the ostomy nurse.  It was grasped with a Kocher and a #10 blade scalpel was used to remove the skin.  Next, dissection was carried down through the fat to the fascia.  A finger was placed inside the body and a cruciate incision was made in the anterior fascia and then the muscle was spread to avoid injury to the  epigastric vessels and then a cruciate incision was made in the posterior sheath. This hiatus was large enough for 2 fingerbreadths to fit through and the end of the conduit along with the stents extruding from it were placed through this up to the skin.  The mesentery to the conduit was checked and found to be without kink or twist and then the conduit was secured into place in 4 quadrants making sure to avoid the mesentery with 2-0 Vicryl sutures.  Attention was then  turned back to the pelvis.  The packing was removed, and FloSeal was placed down into the pelvis.  This was over the areas of the nerve-sparing procedure.  After this was complete, all packing and retractors were removed, and a Jackson-Pratt drain was placed to the left lower quadrant making a new separate stab incision lateral to the rectus.  This was secured in place with a nylon suture.  The fascia was then closed in a running fashion with a #1 PDS making sure not to incorporate any bowel.  After this was done, the incision was irrigated with sterile normal saline, and interrupted 3-0 Vicryl sutures were used to reapproximate the subcutaneous tissue and then staples were used to close the skin.  Exparel was injected into the incision.  The remainder of the conduit was then matured with interrupted 3-0 Vicryl sutures.  The ostomy device was placed over this and the conduit was nice and pink with good urine from each stent.  The wound was dressed in a sterile fashion along with the JP drain.  This completed the procedure.  The patient tolerated it well, was taken to PACU in stable condition.          ______________________________ Natalia Leatherwood, MD     DW/MEDQ  D:  10/07/2011  T:  10/08/2011  Job:  [id number]

## 2011-10-08 NOTE — Progress Notes (Signed)
Urology Progress Note  Subjective:     No acute urologic events overnight. Attempted ambulation last night but unsuccessful due to left hemiparesis and dizziness.  He was able to get up out of bed.  No nausea.  Pain controlled. No flatus or BM.  Objective:  Patient Vitals for the past 24 hrs:  BP Temp Temp src Pulse Resp SpO2 Height Weight  10/08/11 0610 - - - 101  21  98 % - -  10/08/11 0600 - - - 100  23  98 % - -  10/08/11 0500 - - - 90  17  99 % - 79.9 kg (176 lb 2.4 oz)  10/08/11 0400 - 98.7 F (37.1 C) Oral 96  20  98 % - -  10/08/11 0300 - - - 89  18  99 % - -  10/08/11 0200 - - - 87  16  99 % - -  10/08/11 0100 - - - 97  27  99 % - -  10/08/11 0000 - 100 F (37.8 C) Oral 105  22  98 % - -  10/07/11 2300 - - - 101  18  99 % - -  10/07/11 2200 - - - 98  17  99 % - -  10/07/11 2100 - - - 101  15  99 % - -  10/07/11 2000 - 100.3 F (37.9 C) Oral 103  20  99 % - -  10/07/11 1900 - - - 106  18  98 % - -  10/07/11 1800 - - - 102  15  99 % - -  10/07/11 1700 - - - 114  18  99 % - -  10/07/11 1600 - 98.2 F (36.8 C) Oral 97  18  98 % - -  10/07/11 1500 - - - 96  18  100 % - -  10/07/11 1415 - - - 90  17  99 % 5\' 8"  (1.727 m) 80.1 kg (176 lb 9.4 oz)  10/07/11 1410 122/83 mmHg 98.2 F (36.8 C) - 92  15  100 % - -  10/07/11 1345 128/89 mmHg - - 82  13  100 % - -  10/07/11 1330 115/84 mmHg 98.6 F (37 C) - 84  14  100 % - -  10/07/11 1315 114/84 mmHg - - 74  12  99 % - -  10/07/11 1300 109/79 mmHg 98.6 F (37 C) - 64  14  100 % - -  10/07/11 1245 98/72 mmHg - - 77  16  100 % - -  10/07/11 1230 119/86 mmHg - - 80  16  100 % - -  10/07/11 1215 127/89 mmHg 97.7 F (36.5 C) - 76  16  100 % - -    Physical Exam: General:  No acute distress, awake Cardiovascular:    [x]   S1/S2 present, RRR  []   Irregularly irregular Chest:  CTA-B Abdomen:               []  Soft, appropriately TTP  []  Soft, NTTP  [x]  Soft, appropriately TTP, incision(s) clean/dry/intact, JP drain  serosanguinous  Genitourinary: Stoma pink, bilateral nephrostomy tubes with good urine output.     I/O last 3 completed shifts: In: 3110 [I.V.:3000; IV Piggyback:110] Out: 1060 [Drains:270; Stool:490; Blood:300]  Recent Labs  Basename 10/08/11 0300 10/07/11 1231   HGB 11.2* 11.5*   WBC 19.5* 13.7*   PLT 224 223    Recent Labs  Basename 10/08/11 0300  10/07/11 1231   NA 143 141   K 4.3 4.0   CL 111 111   CO2 23 21   BUN 17 16   CREATININE 1.78* 1.49*   CALCIUM 8.2* 8.0*   GFRNONAA 40* 49*   GFRAA 46* 57*     No results found for this basename: PT:2,INR:2,APTT:2 in the last 72 hours   No components found with this basename: ABG:2     Assessment: Bladder cancer POD#1 radical cystoprostatectomy with ileal conduit.  Plan: -Continue out of bed.  Physical therapy ordered to assist in ambulation. -Dulcolax BID to start today. -Add dextrose to IV fluids while NPO. -Discontinue arterial line. -Will re-evaluate later this afternoon for possible transfer status to floor.   Natalia Leatherwood, MD [phone]

## 2011-10-09 ENCOUNTER — Encounter (HOSPITAL_COMMUNITY): Payer: Self-pay | Admitting: Urology

## 2011-10-09 DIAGNOSIS — R5381 Other malaise: Secondary | ICD-10-CM

## 2011-10-09 DIAGNOSIS — C679 Malignant neoplasm of bladder, unspecified: Secondary | ICD-10-CM

## 2011-10-09 LAB — BASIC METABOLIC PANEL
Calcium: 8.2 mg/dL — ABNORMAL LOW (ref 8.4–10.5)
Creatinine, Ser: 1.64 mg/dL — ABNORMAL HIGH (ref 0.50–1.35)
GFR calc Af Amer: 51 mL/min — ABNORMAL LOW (ref >90)
GFR calc non Af Amer: 44 mL/min — ABNORMAL LOW (ref >90)
Sodium: 138 mEq/L (ref 135–145)

## 2011-10-09 LAB — CBC
Platelets: 219 10*3/uL (ref 150–400)
RBC: 3.45 MIL/uL — ABNORMAL LOW (ref 4.22–5.81)
RDW: 14.6 % (ref 11.5–15.5)
WBC: 16.3 10*3/uL — ABNORMAL HIGH (ref 4.0–10.5)

## 2011-10-09 MED ORDER — MAGNESIUM HYDROXIDE 400 MG/5ML PO SUSP
15.0000 mL | Freq: Two times a day (BID) | ORAL | Status: DC
  Administered 2011-10-09 – 2011-10-13 (×8): 15 mL via ORAL
  Filled 2011-10-09 (×8): qty 30

## 2011-10-09 MED ORDER — SENNOSIDES-DOCUSATE SODIUM 8.6-50 MG PO TABS
1.0000 | ORAL_TABLET | Freq: Two times a day (BID) | ORAL | Status: DC
  Administered 2011-10-09 – 2011-10-13 (×8): 1 via ORAL
  Filled 2011-10-09 (×14): qty 1

## 2011-10-09 MED ORDER — MAGNESIUM HYDROXIDE NICU ORAL SYRINGE 400 MG/5 ML: 15.0000 mL | Freq: Two times a day (BID) | ORAL | Status: DC

## 2011-10-09 NOTE — Progress Notes (Signed)
Physical Therapy Treatment Patient Details Name: William Collier MRN: [id number] DOB: 03-28-1951 Today's Date: 10/09/2011  PT Assessment/Plan  PT - Assessment/Plan Comments on Treatment Session: pt continues to put forth effort, is slowly regianing strength, pt states tone of LLE seems more than PTA, as pt was highly functional.  rec. CIR, if not then SNF PT Plan: Discharge plan remains appropriate;Frequency remains appropriate PT Frequency: Min 3X/week Recommendations for Other Services: Rehab consult;OT consult Follow Up Recommendations: Inpatient Rehab Equipment Recommended: Defer to next venue PT Goals  Acute Rehab PT Goals PT Goal Formulation: With patient Time For Goal Achievement: 2 weeks Pt will go Supine/Side to Sit: with supervision PT Goal: Supine/Side to Sit - Progress: Progressing toward goal Pt will go Sit to Stand: with min assist PT Goal: Sit to Stand - Progress: Progressing toward goal Pt will go Stand to Sit: with supervision PT Goal: Stand to Sit - Progress: Progressing toward goal Pt will Transfer Bed to Chair/Chair to Bed: with supervision PT Transfer Goal: Bed to Chair/Chair to Bed - Progress: Progressing toward goal Pt will Ambulate: 51 - 150 feet;with min assist;with least restrictive assistive device PT Goal: Ambulate - Progress: Progressing toward goal  PT Treatment Precautions/Restrictions  Precautions Precautions: Fall Required Braces or Orthoses: No Other Brace/Splint: L AFO recommended for safe transfers. Restrictions Weight Bearing Restrictions: No Other Position/Activity Restrictions: L hemiplegia, wears AFO L ankle, ankle my invert w/ WB Mobility (including Balance) Bed Mobility Bed Mobility: Yes Supine to Sit: 1: +2 Total assist;Patient percentage (comment);With rails;HOB elevated (Comment degrees) Supine to Sit Details (indicate cue type and reason): req. righting assist, scooting assist ,pt=30 Transfers Sit to Stand: 1: +2 Total  assist;Patient percentage (comment);From elevated surface;With upper extremity assist;From bed Sit to Stand Details (indicate cue type and reason): placed L AFO and shoes to support, pt = 60% from bed w/ SPC,  Stand to Sit: 3: Mod assist;To chair/3-in-1 Stand Pivot Transfers: 1: +2 Total assist Stand Pivot Transfer Details (indicate cue type and reason): using SPC pt= 60%, decreased ability to take a full step w/ RLE, trunk  listed to L, Once pt stood for a minute pt. improved w/ control of balance but continued to require steady asssit Ambulation/Gait Ambulation/Gait: No  Posture/Postural Control Posture/Postural Control: Postural limitations Postural Limitations: upon sitting today, pt required steady asssit to gain static sitting, Exercise    End of Session PT - End of Session Equipment Utilized During Treatment: Left ankle foot orthosis Activity Tolerance: Patient tolerated treatment well Patient left: in chair;with call bell in reach Nurse Communication: Mobility status for transfers General Behavior During Session: [facility] for tasks performed Cognition: [facility] for tasks performed  Rada Hay 10/09/2011, 10:40 AM

## 2011-10-09 NOTE — Progress Notes (Signed)
Urology Progress Note  Subjective:     No acute urologic events overnight. Up out of chair multiple times for several hours.  Working with PT.  Positive flatus.  No BM. Pain controlled.  Objective:  Patient Vitals for the past 24 hrs:  BP Temp Temp src Pulse Resp SpO2 Weight  10/09/11 0400 127/87 mmHg 98.5 F (36.9 C) Oral 71  18  99 % 79.9 kg (176 lb 2.4 oz)  10/09/11 0000 132/88 mmHg 99.1 F (37.3 C) Oral 94  19  98 % -  10/08/11 2128 138/90 mmHg - - - - - -  10/08/11 2000 142/93 mmHg 99.1 F (37.3 C) Oral 86  21  100 % -  10/08/11 1600 124/83 mmHg 98.7 F (37.1 C) Oral - - 100 % -  10/08/11 1200 - 98.2 F (36.8 C) Oral 95  21  100 % -  10/08/11 1140 - - - 90  22  99 % -  10/08/11 0843 138/83 mmHg - - 101  30  100 % -  10/08/11 0800 - 98.7 F (37.1 C) Oral 84  16  99 % -    Physical Exam: General:  No acute distress, awake Cardiovascular:    [x]   S1/S2 present, RRR  []   Irregularly irregular Chest:  CTA-B Abdomen:               []  Soft, appropriately TTP  []  Soft, NTTP  [x]  Soft, appropriately TTP, incision(s) clean/dry/intact, JP drain serosanguinous  Genitourinary: Stoma pink, bilateral nephrostomy tubes with good urine output.     I/O last 3 completed shifts: In: 4920 [I.V.:4200; IV Piggyback:720] Out: 4785 [Urine:4460; Drains:325]  Recent Labs  [facility] 10/09/11 0313 10/08/11 0300   HGB 10.3* 11.2*   WBC 16.3* 19.5*   PLT 219 224    Recent Labs  Basename 10/09/11 0313 10/08/11 0300   NA 138 143   K 3.8 4.3   CL 109 111   CO2 22 23   BUN 13 17   CREATININE 1.64* 1.78*   CALCIUM 8.2* 8.2*   GFRNONAA 44* 40*   GFRAA 51* 46*     No results found for this basename: PT:2,INR:2,APTT:2 in the last 72 hours   No components found with this basename: ABG:2     Assessment: Bladder cancer POD#2 radical cystoprostatectomy with ileal conduit.  Plan: -Ordered OT last night. -Transfer to floor. -Start clear liquids and PO bowel regiment. -Inpatient  rehab consult.  Natalia Leatherwood, MD [phone]

## 2011-10-09 NOTE — Progress Notes (Signed)
GU  Patient doing well.  Positive flatus. No BM.  I informed the patient of his pathology.  We reviewed the entire report.  This is very good news and gives him the best percentage of survival from bladder cancer.  Small focus of prostate cancer.  Bladder: pT0 N0 Mx Prostate: pT2a N0 Mx

## 2011-10-09 NOTE — Evaluation (Signed)
Occupational Therapy Evaluation Patient Details Name: William Collier MRN: [id number] DOB: 12-14-1950 Today's Date: 10/09/2011 EV2 [phone] Problem List:  Patient Active Problem List  Diagnoses  . HYPERLIPIDEMIA  . HYPERTENSION  . CVA  . INTERNAL HEMORRHOIDS  . DIVERTICULOSIS, COLON  . ONYCHIA AND PARONYCHIA OF TOE  . DEGENERATIVE JOINT DISEASE  . SHOULDER PAIN, LEFT  . LEG PAIN, LEFT  . BENIGN PROSTATIC HYPERTROPHY, HX OF    Past Medical History:  Past Medical History  Diagnosis Date  . Hypertension 08-20-11  . Dysrhythmia 08-20-11    skip beat, hx. RBBB  . GERD (gastroesophageal reflux disease) 08-20-11    controls with diet, hx. colon polyps(benign)  . Cancer 08-20-11    bladder cancer  . Arthritis   . Stroke 08-20-11    '08-Lt. sided weakness with residual > lt. arm.  . Chronic kidney disease    Past Surgical History:  Past Surgical History  Procedure Date  . Bladder surgery 08-20-11    07-01-11 bladder surgery, past hx. lithotripsy  . Shoulder arthrotomy 08-20-11    left shoulder with retained pins  . Knee arthroscopy 08-20-11    bil. knee scopes-torn ligament  . Cystoscopy w/ ureteral stent placement 08/21/2011    Procedure: CYSTOSCOPY WITH RETROGRADE PYELOGRAM/URETERAL STENT PLACEMENT;  Surgeon: Milford Cage, MD;  Location: [facility];  Service: Urology;  Laterality: Left;  . Cystoscopy with biopsy 08/21/2011    Procedure: CYSTOSCOPY WITH BIOPSY;  Surgeon: Milford Cage, MD;  Location: [facility];  Service: Urology;  Laterality: N/A;  . Transurethral resection of bladder tumor 08/21/2011    Procedure: TRANSURETHRAL RESECTION OF BLADDER TUMOR (TURBT);  Surgeon: Milford Cage, MD;  Location: [facility];  Service: Urology;  Laterality: N/A;  need PK gyrus  . Cystectomy 10/07/2011    Procedure: CYSTECTOMY COMPLETE;  Surgeon: Milford Cage, MD;  Location: [facility];  Service: Urology;  Laterality: Bilateral;  Radical Cystoprostatectomy Bilateral Pelvic  Lymph Node Dissection Formation Of Ileal Conduit   . Prostatectomy 10/07/2011    Procedure: PROSTATECTOMY;  Surgeon: Milford Cage, MD;  Location: [facility];  Service: Urology;  Laterality: N/A;  . Lymph node dissection 10/07/2011    Procedure: LYMPH NODE DISSECTION;  Surgeon: Milford Cage, MD;  Location: [facility];  Service: Urology;  Laterality: Bilateral;    OT Assessment/Plan/Recommendation OT Assessment Clinical Impression Statement: Pt 61 y/o male admitted for surgery related to bladder ca. Pt also has old CVA w/ residual L sided weakness. Skilled OT recommended to maximize I w/BADLs in prep for d/c to next venue of care. OT Recommendation/Assessment: Patient will need skilled OT in the acute care venue OT Problem List: Decreased strength;Decreased range of motion;Decreased activity tolerance;Impaired balance (sitting and/or standing);Decreased knowledge of use of DME or AE;Cardiopulmonary status limiting activity;Impaired UE functional use Barriers to Discharge: Inaccessible home environment;Decreased caregiver support OT Therapy Diagnosis : Generalized weakness OT Plan OT Frequency: Min 2X/week OT Treatment/Interventions: Self-care/ADL training;Therapeutic activities;DME and/or AE instruction;Patient/family education;Therapeutic exercise OT Recommendation Recommendations for Other Services: Rehab consult Follow Up Recommendations: Inpatient Rehab Equipment Recommended: Defer to next venue Individuals Consulted Consulted and Agree with Results and Recommendations: Patient OT Goals Acute Rehab OT Goals OT Goal Formulation: With patient ADL Goals Pt Will Perform Grooming: with min assist;Standing at sink (X 1 task to improve standing activity tolerance.) ADL Goal: Grooming - Progress: Goal set today Pt Will Transfer to Toilet: with min assist;3-in-1;Ambulation;Stand pivot transfer ADL Goal: Toilet Transfer - Progress: Goal set today Pt  Will Perform Toileting - Clothing  Manipulation: with min assist;Standing ADL Goal: Toileting - Clothing Manipulation - Progress: Goal set today Pt Will Perform Toileting - Hygiene: with min assist;Sit to stand from 3-in-1/toilet ADL Goal: Toileting - Hygiene - Progress: Goal set today Arm Goals Pt Will Perform AAROM: with supervision, verbal cues required/provided;to decrease contractures;Left upper extremity;2 sets;10 reps Arm Goal: AAROM - Progress: Goal set today  OT Evaluation Precautions/Restrictions  Precautions Precautions: Fall Required Braces or Orthoses: No Other Brace/Splint: L AFO recommended for safe transfers. Restrictions Weight Bearing Restrictions: No Prior Functioning Home Living Lives With: Alone Receives Help From: Friend(s) Type of Home: Apartment Home Layout: One level Home Access: Level entry Bathroom Shower/Tub: Health visitor: Handicapped height Bathroom Accessibility: Yes Home Adaptive Equipment: Grab bars around toilet;Straight cane;Grab bars in shower Prior Function Level of Independence: Independent with basic ADLs;Independent with gait Driving: Yes ADL ADL Eating/Feeding: Performed;Set up Where Assessed - Eating/Feeding: Chair Grooming: Performed;Wash/dry face;Set up Where Assessed - Grooming: Sitting, chair;Supported Location manager Bathing: Performed;Minimal assistance Where Assessed - Upper Body Bathing: Supported;Sitting, chair Lower Body Bathing: Simulated;+1 Total assistance Where Assessed - Lower Body Bathing: Sit to stand from bed Upper Body Dressing: Performed;Minimal assistance Where Assessed - Upper Body Dressing: Sitting, chair;Supported Lower Body Dressing: Performed;+1 Total assistance Where Assessed - Lower Body Dressing: Sitting, bed;Unsupported;Sit to stand from bed Toilet Transfer: Simulated;+2 Total assistance;Comment for patient % (Pt 60%) Toilet Transfer Details (indicate cue type and reason): A needed to support LUE, LLE & trunk w/increased  extensor tone, has difficuty pivoting. Toilet Transfer Method: Stand pivot Toileting - Clothing Manipulation: +1 Total assistance;Simulated Where Assessed - Glass blower/designer Manipulation: Standing Toileting - Hygiene: Simulated;+1 Total assistance Where Assessed - Toileting Hygiene: Standing Tub/Shower Transfer: Not assessed Tub/Shower Transfer Method: Not assessed Equipment Used: Cane Ambulation Related to ADLs: Supported pt's LUE w/pillow and encouraged him to perform self AAROM exercises when OOB to reduce stiffness. ADL Comments: Pt fatigues quickly at EOB, poor sitting balance w/ posterior leaning of the trunk. Required min A and RUE support to maintain upright posture. Utlized LAFO and SPC during transfer. Vision/Perception  Vision - History Baseline Vision: No visual deficits Patient Visual Report: No change from baseline Cognition Cognition Arousal/Alertness: Awake/alert Overall Cognitive Status: Appears within functional limits for tasks assessed Orientation Level: Oriented X4 Sensation/Coordination Sensation Light Touch: Appears Intact Extremity Assessment RUE Assessment RUE Assessment: Within Functional Limits LUE Assessment LUE Assessment: Exceptions to WFL LUE Tone LUE Tone Comments: flexion synergy noted. Pt unable to use arm in a functional manner. Has slight gross grasp but is unable to release. Wrist contracted at neutral. No active shoulder or bicep contraction noted. 2-/5 in triceps. Mobility  Bed Mobility Supine to Sit: 1: +2 Total assist;Patient percentage (comment);With rails;HOB elevated (Comment degrees) (Pt 30%) Transfers Transfers: Yes Sit to Stand: 1: +2 Total assist;Patient percentage (comment);From elevated surface;With upper extremity assist;From bed (Pt 60% Support needed for LUE) Stand to Sit: 3: Mod assist;To chair/3-in-1;With upper extremity assist Exercises   End of Session OT - End of Session Equipment Utilized During Treatment: Other  (comment);Left ankle foot orthosis (SPC) Activity Tolerance: Patient limited by fatigue Patient left: in chair;with call bell in reach General Behavior During Session: [facility] for tasks performed Cognition: [facility] for tasks performed   Tabbitha Janvrin A, OTR/L [phone] 10/09/2011, 10:19 AM

## 2011-10-09 NOTE — Consult Note (Signed)
Physical Medicine and Rehabilitation Consult Reason for Consult: Deconditioning/bladder cancer Referring Phsyician: Dr. Sissy Hoff is an 61 y.o. male.   HPI: 61 year old right-handed male with history of stroke in 2008 with left hemiparesis and bladder cancer with history of TURP in August of 2012 that showed UCC high-grade non-muscle invasive cancer. He was staged MRI of the abdomen and pelvis in July with contrast due to renal insufficiency. Patient was admitted February 4 and underwent radical cystectomy-prostatectomy,  with creation of ileoconduit urinary diversion February 4 per Dr. Margarita Grizzle of urology services. No current plan for radiation or chemotherapy. Lymph nodes by report were negative. Physical therapy completed February 5 and currently total assist. Physical medicine and rehabilitation was consulted to consider inpatient rehabilitation services  Review of Systems  Constitutional: Positive for malaise/fatigue.  Eyes: Negative for double vision.  Respiratory: Negative for cough and shortness of breath.   Cardiovascular: Negative for chest pain.  Gastrointestinal: Positive for constipation. Negative for nausea.  Genitourinary: Positive for frequency.  Musculoskeletal: Positive for myalgias.  Neurological: Negative for dizziness and headaches.  Psychiatric/Behavioral: Negative.   All other systems reviewed and are negative.   Past Medical History  Diagnosis Date  . Hypertension 08-20-11  . Dysrhythmia 08-20-11    skip beat, hx. RBBB  . GERD (gastroesophageal reflux disease) 08-20-11    controls with diet, hx. colon polyps(benign)  . Cancer 08-20-11    bladder cancer  . Arthritis   . Stroke 08-20-11    '08-Lt. sided weakness with residual > lt. arm.  . Chronic kidney disease    Past Surgical History  Procedure Date  . Bladder surgery 08-20-11    07-01-11 bladder surgery, past hx. lithotripsy  . Shoulder arthrotomy 08-20-11    left shoulder with retained  pins  . Knee arthroscopy 08-20-11    bil. knee scopes-torn ligament  . Cystoscopy w/ ureteral stent placement 08/21/2011    Procedure: CYSTOSCOPY WITH RETROGRADE PYELOGRAM/URETERAL STENT PLACEMENT;  Surgeon: Milford Cage, MD;  Location: [facility];  Service: Urology;  Laterality: Left;  . Cystoscopy with biopsy 08/21/2011    Procedure: CYSTOSCOPY WITH BIOPSY;  Surgeon: Milford Cage, MD;  Location: [facility];  Service: Urology;  Laterality: N/A;  . Transurethral resection of bladder tumor 08/21/2011    Procedure: TRANSURETHRAL RESECTION OF BLADDER TUMOR (TURBT);  Surgeon: Milford Cage, MD;  Location: [facility];  Service: Urology;  Laterality: N/A;  need PK gyrus  . Cystectomy 10/07/2011    Procedure: CYSTECTOMY COMPLETE;  Surgeon: Milford Cage, MD;  Location: [facility];  Service: Urology;  Laterality: Bilateral;  Radical Cystoprostatectomy Bilateral Pelvic Lymph Node Dissection Formation Of Ileal Conduit   . Prostatectomy 10/07/2011    Procedure: PROSTATECTOMY;  Surgeon: Milford Cage, MD;  Location: [facility];  Service: Urology;  Laterality: N/A;  . Lymph node dissection 10/07/2011    Procedure: LYMPH NODE DISSECTION;  Surgeon: Milford Cage, MD;  Location: [facility];  Service: Urology;  Laterality: Bilateral;   History reviewed. No pertinent family history. Social History:  reports that he has been smoking Cigarettes.  He has a 40 pack-year smoking history. He does not have any smokeless tobacco history on file. He reports that he uses illicit drugs (Marijuana) about 3 times per week. He reports that he does not drink alcohol. Allergies:  Allergies  Allergen Reactions  . Penicillins Other (See Comments)    HAD WHEN WAS A CHILD   . Pregabalin Swelling   Medications Prior to Admission  Medication Dose Route Frequency Provider Last Rate Last Dose  . acetaminophen (OFIRMEV) IV 1,000 mg  1,000 mg Intravenous Q6H Milford Cage, MD   1,000 mg at 10/08/11 1135     Followed by  . acetaminophen (TYLENOL) tablet 650 mg  650 mg Oral Q4H PRN Milford Cage, MD      . amLODipine [facility]) tablet 10 mg  10 mg Oral Daily Milford Cage, MD   10 mg at 10/08/11 4098  . bisacodyl (DULCOLAX) suppository 10 mg  10 mg Rectal BID Milford Cage, MD   10 mg at 10/08/11 1756  . cloNIDine (CATAPRES) tablet 0.1 mg  0.1 mg Oral BID Milford Cage, MD   0.1 mg at 10/08/11 2128  . dextrose 5 %-0.9 % sodium chloride infusion   Intravenous Continuous Milford Cage, MD 150 mL/hr at 10/09/11 0612 150 mL at 10/09/11 0612  . gentamicin (GARAMYCIN) 400 mg in dextrose 5 % 100 mL IVPB  400 mg Intravenous Once Milford Cage, MD   400 mg at 10/07/11 1956  . gentamicin (GARAMYCIN) IVPB 80 mg  80 mg Intravenous 30 min Pre-Op    80 mg at 10/07/11 0737  . heparin injection 5,000 Units  5,000 Units Subcutaneous 120 min pre-op    5,000 Units at 10/07/11 0630  . heparin injection 5,000 Units  5,000 Units Subcutaneous Q8H Milford Cage, MD   5,000 Units at 10/09/11 [phone]  . HYDROmorphone (DILAUDID) 1 MG/ML injection           . magnesium hydroxide (MILK OF MAGNESIA) suspension 15 mL  15 mL Oral BID Milford Cage, MD      . metroNIDAZOLE (FLAGYL) IVPB 1 g  1 g Intravenous Once Milford Cage, MD   1 g at 10/07/11 0754  . metroNIDAZOLE (FLAGYL) IVPB 500 mg  500 mg Intravenous Q8H Milford Cage, MD   500 mg at 10/08/11 0511  . morphine 2 MG/ML injection 2-4 mg  2-4 mg Intravenous Q2H PRN Milford Cage, MD   2 mg at 10/08/11 2130  . ondansetron (ZOFRAN) injection 4 mg  4 mg Intravenous Q4H PRN Milford Cage, MD      . oxyCODONE (Oxy IR/ROXICODONE) immediate release tablet 5 mg  5 mg Oral Q4H PRN Milford Cage, MD      . pantoprazole (PROTONIX) injection 40 mg  40 mg Intravenous Q1200 Milford Cage, MD   40 mg at 10/08/11 1125  . senna-docusate (Senokot-S) tablet 1 tablet  1 tablet Oral BID  Milford Cage, MD      . simvastatin [facility]) tablet 20 mg  20 mg Oral q1800 Milford Cage, MD   20 mg at 10/08/11 1756  . DISCONTD: 0.9 %  sodium chloride infusion   Intravenous Continuous Elesa Massed, CRNA      . DISCONTD: 0.9 %  sodium chloride infusion   Intravenous Continuous Milford Cage, MD 150 mL/hr at 10/08/11 0400    . DISCONTD: 0.9 % irrigation (POUR BTL)    PRN Milford Cage, MD   1,000 mL at 10/07/11 0829  . DISCONTD: bupivacaine liposome (EXPAREL) 1.3 % injection 266 mg  20 mL Infiltration Once Milford Cage, MD      . DISCONTD: bupivacaine liposome (EXPAREL) 1.3 % injection    PRN Milford Cage, MD   20 mL at 10/07/11 0929  . DISCONTD: hemostatic agents    PRN Milford Cage, MD  1 application at 10/07/11 1133  . DISCONTD: HYDROmorphone (DILAUDID) injection 0.25-0.5 mg  0.25-0.5 mg Intravenous Q5 min PRN Riesa Pope, MD   0.5 mg at 10/07/11 1329  . DISCONTD: magnesium hydroxide (MILK OF MAGNESIA) NICU oral syringe  15 mL Oral BID Milford Cage, MD      . DISCONTD: meperidine (DEMEROL) injection 6.5-12.5 mg  6.5-12.5 mg Intravenous Q5 min PRN Riesa Pope, MD      . DISCONTD: metroNIDAZOLE (FLAGYL) IVPB 1 g  1 g Intravenous Once       . DISCONTD: promethazine (PHENERGAN) injection 6.25-12.5 mg  6.25-12.5 mg Intravenous Q15 min PRN Riesa Pope, MD       Medications Prior to Admission  Medication Sig Dispense Refill  . amLODipine (NORVASC) 10 MG tablet Take 10 mg by mouth every morning.       . cloNIDine (CATAPRES) 0.1 MG tablet Take 0.1 mg by mouth 2 (two) times daily.       Marland Kitchen doxazosin (CARDURA) 4 MG tablet Take 4 mg by mouth at bedtime.       . finasteride (PROSCAR) 5 MG tablet Take 5 mg by mouth at bedtime. Pt. States he takes in th AM      . lisinopril (PRINIVIL,ZESTRIL) 40 MG tablet Take 40 mg by mouth every morning.       . pravastatin (PRAVACHOL) 40 MG tablet Take 40 mg by mouth at bedtime.       Marland Kitchen  aspirin EC 325 MG tablet Take 1 tablet (325 mg total) by mouth every morning.  1 tablet  0  . oxybutynin (DITROPAN) 5 MG tablet Take 1 tablet (5 mg total) by mouth every 6 (six) hours as needed. Do Not Take the day prior or the day of your scheduled catheter removal.  40 tablet  4  . senna-docusate (SENOKOT S) 8.6-50 MG per tablet Take 1 tablet by mouth 2 (two) times daily.  60 tablet  0    Home: Home Living Lives With: Alone Receives Help From: Friend(s) Type of Home: Apartment Home Layout: One level Home Access: Level entry Bathroom Shower/Tub: Health visitor: Handicapped height Bathroom Accessibility: Yes Home Adaptive Equipment: Straight cane;Grab bars around toilet;Grab bars in shower (L ankle brace)  Functional History: Prior Function Level of Independence: Independent with basic ADLs;Independent with gait Driving: Yes Functional Status:  Mobility: Bed Mobility Bed Mobility: Yes Supine to Sit: 1: +2 Total assist;HOB elevated (Comment degrees) Supine to Sit Details (indicate cue type and reason): pt required righting assist, had incr. pain of op site of abd so assisted pt more than would be needed Transfers Transfers: Yes Sit to Stand: 1: +2 Total assist;From elevated surface;From bed;With upper extremity assist Sit to Stand Details (indicate cue type and reason): attention  to L ankle due to inversion of ankle tone, pt uses a brace which is not present..  pt=5=, pt able to reach to recliner armrest to pivot Stand to Sit: 3: Mod assist;To chair/3-in-1;With upper extremity assist Stand to Sit Details: pt did appear to have incr in trunk tone  to sit to recliner, trunk was stiff Stand Pivot Transfers: 1: +2 Total assist Stand Pivot Transfer Details (indicate cue type and reason): pt stood then assisted to reach for aermrest, unable to utilze one side of RW, no cane available.  L ankle inversion with efforts to move to recliner.      ADL:     Cognition: Cognition Arousal/Alertness: Awake/alert Orientation Level: Oriented X4  Cognition Arousal/Alertness: Awake/alert Overall Cognitive Status: Appears within functional limits for tasks assessed Orientation Level: Oriented X4  Blood pressure 127/87, pulse 71, temperature 98.5 F (36.9 C), temperature source Oral, resp. rate 18, height 5\' 8"  (1.727 m), weight 79.9 kg (176 lb 2.4 oz), SpO2 99.00%. Physical Exam  Nursing note and vitals reviewed. Constitutional: He is oriented to person, place, and time. He appears well-developed.  Neck: Normal range of motion. Neck supple. No thyromegaly present.  Cardiovascular: Normal rate and regular rhythm.   Pulmonary/Chest: Breath sounds normal. No respiratory distress. He has no wheezes.  Abdominal: He exhibits no distension. There is no tenderness.  Musculoskeletal: He exhibits no edema.  Neurological: He is alert and oriented to person, place, and time.       Pt with dense left hp. 0-1/5 LUE, and 1 to 2/5 LLE.  Resting tone at 1-2/4.  Sensation 1/2 on left.  Left 7 and 12.  Speech intelligible. Cogntively intact. RUE 5/5 and RLE 3-4/5 with pain inhibition.   Skin: Skin is warm and dry.       Operative sites dressed.  Psychiatric: He has a normal mood and affect.    Results for orders placed during the hospital encounter of 10/07/11 (from the past 24 hour(s))  BASIC METABOLIC PANEL     Status: Abnormal   Collection Time   10/09/11  3:13 AM      Component Value Range   Sodium 138  135 - 145 (mEq/L)   Potassium 3.8  3.5 - 5.1 (mEq/L)   Chloride 109  96 - 112 (mEq/L)   CO2 22  19 - 32 (mEq/L)   Glucose, Bld 158 (*) 70 - 99 (mg/dL)   BUN 13  6 - 23 (mg/dL)   Creatinine, Ser 1.19 (*) 0.50 - 1.35 (mg/dL)   Calcium 8.2 (*) 8.4 - 10.5 (mg/dL)   GFR calc non Af Amer 44 (*) >90 (mL/min)   GFR calc Af Amer 51 (*) >90 (mL/min)  CBC     Status: Abnormal   Collection Time   10/09/11  3:13 AM      Component Value Range   WBC 16.3 (*) 4.0 -  10.5 (K/uL)   RBC 3.45 (*) 4.22 - 5.81 (MIL/uL)   Hemoglobin 10.3 (*) 13.0 - 17.0 (g/dL)   HCT 14.7 (*) 82.9 - 52.0 (%)   MCV 88.7  78.0 - 100.0 (fL)   MCH 29.9  26.0 - 34.0 (pg)   MCHC 33.7  30.0 - 36.0 (g/dL)   RDW 56.2  13.0 - 86.5 (%)   Platelets 219  150 - 400 (K/uL)   Dg Abd Portable 1v  10/07/2011  *RADIOLOGY REPORT*  Clinical Data: Status post placement of double-J stents. Status post cystectomy.  PORTABLE ABDOMEN - 1 VIEW  Comparison: MRI 09/09/2011  Findings: There are bilateral double J ureteral stents, coursing into the right mid abdomen and through ostomy, consistent with urinary diversion following cystectomy.  Midline surgical clips are present.  A surgical drain overlies the lower, central pelvis.  The upper portions of the urinary stents overlie the expected locations of the kidneys.  Intrarenal calculi are noted bilaterally. Visualized bowel gas pattern is nonobstructive.  IMPRESSION:  1.  Postoperative appearance of the abdomen pelvis. 2.  Urinary stents as described.  Original Report Authenticated By: Patterson Hammersmith, M.D.    Assessment/Plan: Diagnosis: cystectomy-prostatectomy due to bladder ca.  Pt with hx of right cva with dense left hemiparesis 1. Does the need for close,  24 hr/day medical supervision in concert with the patient's rehab needs make it unreasonable for this patient to be served in a less intensive setting? Yes 2. Co-Morbidities requiring supervision/potential complications: htn 3. Due to bladder management, bowel management, safety, skin/wound care, disease management, medication administration, pain management and patient education, does the patient require 24 hr/day rehab nursing? Yes 4. Does the patient require coordinated care of a physician, rehab nurse, PT (1-2 hrs/day, 5 days/week) and OT (1-2 hrs/day, 5 days/week) to address physical and functional deficits in the context of the above medical diagnosis(es)? Yes Addressing deficits in the following  areas: balance, endurance, locomotion, strength, transferring, bowel/bladder control, bathing, dressing, feeding, grooming, toileting and psychosocial support 5. Can the patient actively participate in an intensive therapy program of at least 3 hrs of therapy per day at least 5 days per week? Yes 6. The potential for patient to make measurable gains while on inpatient rehab is good 7. Anticipated functional outcomes upon discharge from inpatients are minimal assist  PT, minimal to mod assist OT,  8. Estimated rehab length of stay to reach the above functional goals is: 2-3 weeks 9. Does the patient have adequate social supports to accommodate these discharge functional goals? No 10. Anticipated D/C setting: other 11. Anticipated post D/C treatments: HH therapy 12. Overall Rehab/Functional Prognosis: good  RECOMMENDATIONS: This patient's condition is appropriate for continued rehabilitative care in the following setting: CIR if supports are available. Patient has agreed to participate in recommended program. Yes and Potentially Note that insurance prior authorization may be required for reimbursement for recommended care.  Comment: Will need to arrange assistance at home after a potential inpatient rehab admit.  Rehab RN to follow up.   Ivory Broad, MD 10/09/2011

## 2011-10-10 LAB — BASIC METABOLIC PANEL
CO2: 24 mEq/L (ref 19–32)
Chloride: 109 mEq/L (ref 96–112)
GFR calc Af Amer: 50 mL/min — ABNORMAL LOW (ref >90)
Sodium: 137 mEq/L (ref 135–145)

## 2011-10-10 MED ORDER — OXYCODONE HCL 5 MG PO TABS
5.0000 mg | ORAL_TABLET | ORAL | Status: DC | PRN
  Administered 2011-10-11 – 2011-10-15 (×6): 10 mg via ORAL
  Filled 2011-10-10 (×7): qty 2

## 2011-10-10 MED ORDER — SODIUM CHLORIDE 0.9 % IV SOLN
INTRAVENOUS | Status: DC
  Administered 2011-10-11 – 2011-10-13 (×3): via INTRAVENOUS

## 2011-10-10 NOTE — Progress Notes (Signed)
Rehab admissions - Evaluated for possible admission.  I spoke with patient and he lives alone.  He does not have anyone to stay with him at home at this point.  He will need assist at home even after an inpatient rehab stay.  He is going to see if he can find someone to stay with him.  I will check back tomorrow am to see about his progress and plans.  He really does not want to go to a SNF.  Pager [phone]

## 2011-10-10 NOTE — Progress Notes (Addendum)
Urology Progress Note  Subjective:     No acute urologic events overnight. Continues to work with PT & OT.  Positive flatus.  No BM. No nausea. Pain controlled.  Objective:  Patient Vitals for the past 24 hrs:  BP Temp Temp src Pulse Resp SpO2  10/10/11 0534 122/79 mmHg 99.2 F (37.3 C) Oral 73  20  99 %  10/09/11 2202 135/88 mmHg 99.4 F (37.4 C) Oral 87  16  98 %  10/09/11 1357 137/93 mmHg 97.9 F (36.6 C) - 88  21  99 %  10/09/11 0950 144/98 mmHg 98.4 F (36.9 C) Oral 107  20  98 %  10/09/11 0815 150/90 mmHg - - 85  20  88 %  10/09/11 0800 - 98.9 F (37.2 C) Oral 79  22  90 %    Physical Exam: General:  No acute distress, awake Cardiovascular:    [x]   S1/S2 present, RRR  []   Irregularly irregular Chest:  CTA-B Abdomen:               []  Soft, appropriately TTP  []  Soft, NTTP  [x]  Soft, appropriately TTP, incision(s) clean/dry/intact, JP drain serosanguinous  Genitourinary: Stoma pink, bilateral nephrostomy tubes with good urine output.     I/O last 3 completed shifts: In: 4190 [P.O.:240; I.V.:3840; IV Piggyback:110] Out: 5261 [Urine:4975; Drains:286]  Recent Labs  [facility] 10/09/11 0313 10/08/11 0300   HGB 10.3* 11.2*   WBC 16.3* 19.5*   PLT 219 224    Recent Labs  Basename 10/10/11 0416 10/09/11 0313   NA 137 138   K 3.5 3.8   CL 109 109   CO2 24 22   BUN 12 13   CREATININE 1.66* 1.64*   CALCIUM 8.3* 8.2*   GFRNONAA 43* 44*   GFRAA 50* 51*     No results found for this basename: PT:2,INR:2,APTT:2 in the last 72 hours   No components found with this basename: ABG:2     Assessment: Bladder cancer POD#3 radical cystoprostatectomy with ileal conduit.  Plan: -Consult to Inpatient Rehab: looks like patient would be a good candidate. -Advance diet to regular. -Change IV fluid back to NS and decrease rate to 75 mL/hr  Natalia Leatherwood, MD [phone]

## 2011-10-10 NOTE — Consult Note (Signed)
WOC ostomy consult  Stoma type/location: right lower quadrant ileal conduit (urostomy) Stomal assessment/size: 1 and 1/8 round, slightly budded, pink, moist, sutures intact, 2 stents protruding. Peristomal assessment: intact Treatment options for stomal/peristomal skin: none required Output clear light yellow urine Ostomy pouching: 1pc. Urostomy pouch with built-in convexity, Lawson# [id number]  Education provided: Caregiver observed demonstration of urostomy pouch change and feels able to manage with a few more observations.  Basic A&P taught, also pathophysiology.  Stoma characteristics, pouch characteristics, stent function and fact that they will "slip out" within the next 7-21 days covered.  Discussed rehab placement with patient, he is hopeful that if this is needed, it will be short in duration.  Discussed need to increase protein in diet as he is recovering from surgery, drink plenty of fluids from now on (including water to acidify his urine).  Patient established with [employer] post op sampling program. Our team will follow and remain available for questions throughout stay. Thanks  for this referral and for allowing [place] to assist this nice gentleman. Ladona Mow, MSN, RN, GNP, CWOCN [phone])

## 2011-10-11 LAB — TYPE AND SCREEN
ABO/RH(D): A POS
Antibody Screen: NEGATIVE
Unit division: 0
Unit division: 0

## 2011-10-11 NOTE — Progress Notes (Signed)
Discussed with patient need for continued rehab post discharge in either SNF or CIR. Patient wants CIR and gave me a # for Britta Mccreedy who lives a few doors down and helps out as needed. Contacted Britta Mccreedy and she states, she can help prn but not 24 hrs once patient is home as she has commitments daily outside of the home. SNF would be more suitable option and discussed this with patient who agrees that he will need more time than allowed at CIR to increase functional level. CIR signing off at this time. Will contact SW and CM regarding this. Toni Amend [phone]

## 2011-10-11 NOTE — Progress Notes (Signed)
  CARE MANAGEMENT NOTE 10/11/2011  Patient:  [facility]   Account Number:  [id number]  Date Initiated:  10/11/2011  Documentation initiated by:  Colleen Can  Subjective/Objective Assessment:   dx bladder cancer     Action/Plan:   Plans are for [facility] Inpatient Rehab  1st choice and 2nd choice ST snf   Anticipated DC Date:  10/14/2011   Anticipated DC Plan:  IP REHAB FACILITY  In-house referral  Clinical Social Worker      DC Associate Professor  CM consult      PAC Choice  IP REHAB   Choice offered to / List presented to:  C-1 Patient   DME arranged  NA      DME agency  NA     HH arranged  NA      Status of service:  Completed, signed off

## 2011-10-11 NOTE — Progress Notes (Signed)
Urology Progress Note  Subjective:     No acute urologic events overnight.  Positive flatus.  No BM. No nausea. Pain controlled. Tolerating regular diet.  Objective:  Patient Vitals for the past 24 hrs:  BP Temp Temp src Pulse Resp SpO2  10/11/11 0537 134/84 mmHg 98.4 F (36.9 C) Oral 84  20  97 %  10/10/11 2102 122/82 mmHg 99 F (37.2 C) Oral 80  18  99 %  10/10/11 1600 129/86 mmHg 99.1 F (37.3 C) Oral 99  18  99 %  10/10/11 0919 138/94 mmHg - - - - -    Physical Exam: General:  No acute distress, awake Cardiovascular:    [x]   S1/S2 present, RRR  []   Irregularly irregular Chest:  CTA-B Abdomen:               []  Soft, appropriately TTP  []  Soft, NTTP  [x]  Soft, appropriately TTP, incision(s) clean/dry/intact, JP drain serosanguinous  Genitourinary: Stoma pink, bilateral ureter stents with good urine output.     I/O last 3 completed shifts: In: 1850 [P.O.:240; I.V.:1610] Out: 2788 [Urine:2575; Drains:213]  Recent Labs  [facility] 10/09/11 0313   HGB 10.3*   WBC 16.3*   PLT 219    Recent Labs  Basename 10/10/11 0416 10/09/11 0313   NA 137 138   K 3.5 3.8   CL 109 109   CO2 24 22   BUN 12 13   CREATININE 1.66* 1.64*   CALCIUM 8.3* 8.2*   GFRNONAA 43* 44*   GFRAA 50* 51*     No results found for this basename: PT:2,INR:2,APTT:2 in the last 72 hours   No components found with this basename: ABG:2     Assessment: Bladder cancer POD#4 radical cystoprostatectomy with ileal conduit.  Plan: -Continue regular diet. -Will remove JP drain when bowel function becomes more regular. -Labs for tomorrow morining: CBC, BMP. -Will likely be ready for discharge from inpatient setting over the weekend vs early next week.  Disposition for discharge still unknown though he would like to go to inpatient rehab.  Natalia Leatherwood, MD [phone]

## 2011-10-11 NOTE — Consult Note (Signed)
WOC ostomy follow-up consult  Convex urostomy pouch intact with good seal, draining large amt clear yellow urine.  Stoma red and viable with stints intact.  Pouch was changed yesterday and supplies available at bedside for staff use.  Pt denies further questions at this time regarding pouching routines or emptying.    Cammie Mcgee, RN, MSN, [employer]  [phone]

## 2011-10-12 LAB — BASIC METABOLIC PANEL
BUN: 18 mg/dL (ref 6–23)
Calcium: 8.4 mg/dL (ref 8.4–10.5)
Chloride: 112 mEq/L (ref 96–112)
GFR calc non Af Amer: 47 mL/min — ABNORMAL LOW (ref >90)
Potassium: 3.9 mEq/L (ref 3.5–5.1)
Sodium: 142 mEq/L (ref 135–145)

## 2011-10-12 LAB — CBC
Platelets: 269 10*3/uL (ref 150–400)
RDW: 14.2 % (ref 11.5–15.5)
WBC: 8.3 10*3/uL (ref 4.0–10.5)

## 2011-10-12 MED ORDER — PANTOPRAZOLE SODIUM 40 MG PO TBEC
40.0000 mg | DELAYED_RELEASE_TABLET | Freq: Every day | ORAL | Status: DC
  Administered 2011-10-12 – 2011-10-15 (×4): 40 mg via ORAL
  Filled 2011-10-12 (×4): qty 1

## 2011-10-12 NOTE — Progress Notes (Signed)
T:   98.3          BP: 134/88                     P: 71                  R:  18 No abdominal pain Tolerates regular diet well. Passing flatus.  No BM as yet. Lungs: clear.  Heart: Regular rhythm. Abdomen: soft, non distended.  Bowel sounds: normal. Wound clean and dry. Ileal loop: pink, drains well. Hgb: 9.7  Hct 29.4   BUN: 18   Cr: 1.55 Good urinary output. Plan: ambulate with assistance.  Dulcolax supp.

## 2011-10-12 NOTE — Progress Notes (Signed)
The patient is receiving Protonix by the intravenous route.  Based on criteria approved by the Pharmacy and Therapeutics Committee and the Medical Executive Committee, the medication is being converted to the equivalent oral dose form.  These criteria include: -No Active GI bleeding -Able to tolerate diet of full liquids (or better) or tube feeding OR able to tolerate other medications by the oral or enteral route  If you have any questions about this conversion, please contact the Pharmacy Department (ext [phone]).  Thank you.  Hessie Knows, PharmD, BCPS 10/12/2011 11:41 AM pager [phone]

## 2011-10-13 NOTE — Progress Notes (Signed)
  Subjective: Feels fine.  No abdominal pain.  Tolerates diet well. Has bowel movements.   Objective: Vital signs in last 24 hours: Temp:  [98.5 F (36.9 C)-99.1 F (37.3 C)] 98.8 F (37.1 C) (02/10 0502) Pulse Rate:  [74-90] 74  (02/10 0502) Resp:  [18-19] 18  (02/10 0502) BP: (126-147)/(77-89) 147/89 mmHg (02/10 0502) SpO2:  [96 %-98 %] 98 % (02/10 0502)  Intake/Output from previous day: 02/09 0701 - 02/10 0700 In: 1020 [P.O.:120; I.V.:900] Out: 2703 [Urine:2700; Stool:3] Intake/Output this shift:    Physical Exam:   GI: Abdomen: soft, non distended.  Ileal loop draining well. Sero sanguineous drainage at lower end of incision.  Skin staples at that end of the incision were removed. Wound culture: done.  Sterile dressing applied  to wound.    Lab Results:  Basename 10/12/11 0510  HGB 9.7*  HCT 29.4*   BMET  Basename 10/12/11 0510  NA 142  K 3.9  CL 112  CO2 22  GLUCOSE 101*  BUN 18  CREATININE 1.55*  CALCIUM 8.4   No results found for this basename: LABPT:3,INR:3 in the last 72 hours No results found for this basename: LABURIN:1 in the last 72 hours Results for orders placed during the hospital encounter of 10/04/11  SURGICAL PCR SCREEN     Status: Normal   Collection Time   10/04/11  9:58 AM      Component Value Range Status Comment   MRSA, PCR NEGATIVE  NEGATIVE  Final    Staphylococcus aureus NEGATIVE  NEGATIVE  Final     Studies/Results: No results found.  Assessment/Plan:  S/P radical cystoprostatectomy with ileal loop.  P: wound culture. CBC. BMET in AM.   LOS: 6 days   Amir Glaus-HENRY 10/13/2011, 8:57 AM

## 2011-10-14 LAB — BASIC METABOLIC PANEL
CO2: 23 mEq/L (ref 19–32)
Calcium: 8.5 mg/dL (ref 8.4–10.5)
Chloride: 112 mEq/L (ref 96–112)
Glucose, Bld: 108 mg/dL — ABNORMAL HIGH (ref 70–99)
Sodium: 142 mEq/L (ref 135–145)

## 2011-10-14 LAB — DIFFERENTIAL
Basophils Absolute: 0 10*3/uL (ref 0.0–0.1)
Eosinophils Relative: 3 % (ref 0–5)
Lymphocytes Relative: 14 % (ref 12–46)
Lymphs Abs: 1.3 10*3/uL (ref 0.7–4.0)
Monocytes Absolute: 1 10*3/uL (ref 0.1–1.0)
Neutro Abs: 6.6 10*3/uL (ref 1.7–7.7)

## 2011-10-14 LAB — CBC
HCT: 27.3 % — ABNORMAL LOW (ref 39.0–52.0)
MCV: 87.2 fL (ref 78.0–100.0)
Platelets: 313 10*3/uL (ref 150–400)
RBC: 3.13 MIL/uL — ABNORMAL LOW (ref 4.22–5.81)
RDW: 14 % (ref 11.5–15.5)
WBC: 9.2 10*3/uL (ref 4.0–10.5)

## 2011-10-14 MED ORDER — ENOXAPARIN SODIUM 40 MG/0.4ML ~~LOC~~ SOLN
40.0000 mg | SUBCUTANEOUS | Status: DC
  Administered 2011-10-14: 40 mg via SUBCUTANEOUS
  Filled 2011-10-14 (×2): qty 0.4

## 2011-10-14 MED ORDER — ENOXAPARIN SODIUM 40 MG/0.4ML ~~LOC~~ SOLN: 40.0000 mg | SUBCUTANEOUS | Status: DC

## 2011-10-14 MED ORDER — BISACODYL 10 MG RE SUPP: 10.0000 mg | Freq: Every day | RECTAL | Status: AC | PRN

## 2011-10-14 MED ORDER — BISACODYL 10 MG RE SUPP: 10.0000 mg | Freq: Every day | RECTAL | Status: DC | PRN

## 2011-10-14 MED ORDER — OXYCODONE HCL 5 MG PO TABS: 5.0000 mg | ORAL_TABLET | ORAL | Status: AC | PRN

## 2011-10-14 NOTE — Progress Notes (Signed)
CSW continues to assist with d/c planning. SNF bed offers provided to pt. Pt will review offers with family and CSW will assist with SNF placement once choice is made.

## 2011-10-14 NOTE — Progress Notes (Signed)
CSW met with pt this am to assist with d/c planning. Pt was hoping for CIR placement but is agreeable with ST SNF. CSW is assisting with SNF placement at this time. Pt has been faxed out to [facility]. Expecting  bed offers to be available this afternoon. Will follow to assist with d/c planning to SNF.

## 2011-10-14 NOTE — Progress Notes (Signed)
Occupational Therapy Treatment Patient Details Name: William Collier MRN: [id number] DOB: Dec 07, 1950 Today's Date: 10/14/2011  OT Assessment/Plan OT Assessment/Plan OT Frequency: Min 2X/week Follow Up Recommendations: Inpatient Rehab OT Goals Acute Rehab OT Goals OT Goal Formulation: With patient Time For Goal Achievement: 2 weeks ADL Goals Pt Will Perform Grooming: with min assist;Standing at sink ADL Goal: Grooming - Progress: Met Pt Will Perform Lower Body Dressing: with min assist;Other (comment) (bil shoes, socks. left AFO) ADL Goal: Lower Body Dressing - Progress: Goal set today Pt Will Transfer to Toilet: with min assist;3-in-1;Ambulation;Stand pivot transfer ADL Goal: Toilet Transfer - Progress: Met Pt Will Perform Toileting - Clothing Manipulation: with min assist;Standing Pt Will Perform Toileting - Hygiene: with min assist;Sit to stand from 3-in-1/toilet ADL Goal: Toileting - Hygiene - Progress: Progressing toward goals Arm Goals Pt Will Perform AROM: with supervision, verbal cues required/provided;to decrease contractures;Left upper extremity;2 sets;10 reps  OT Treatment Precautions/Restrictions  Precautions Precautions: Fall Required Braces or Orthoses:  (uses AFO on left ankle) Restrictions Weight Bearing Restrictions: No   ADL ADL Grooming: Performed;Wash/dry hands;Wash/dry face;Teeth care;Supervision/safety Where Assessed - Grooming: Standing at sink Toilet Transfer: Performed;Minimal assistance;Other (comment) (min guard) Toilet Transfer Method: Stand pivot Acupuncturist: Set designer - Hygiene: Performed;Maximal assistance Where Assessed - Toileting Hygiene: Standing Ambulation Related to ADLs: ambulated to sink with min guard assist using SPC ADL Comments: tolerated well:  total A to don BIl shoes/L AFO:   Mobility  Transfers Sit to Stand: 4: Min assist;Other (comment);From chair/3-in-1;With upper extremity assist (min  guard) Exercises    End of Session  Pt left in chair with call bell within reach   [facility], OTR/L [phone] 10/14/2011 William Collier  10/14/2011, 1:07 PM

## 2011-10-14 NOTE — Consults (Signed)
WOC ostomy consult  Stoma type/location: right lower quadrant ileal conduit  Stomal assessment/size: 1 and 1/4 inches round, raised and pink. Suture intact, 2 stents intact, but sutures dislodged and advancing. Peristomal assessment: intact, clear Treatment options for stomal/peristomal skin: none indicated Output clear yellow urine Ostomy pouching: 1pc. Pouch with built-in convexity  Education provided: Demonstration of pouch change.  Patient able to verbalize steps of procedure as well as talk through steps needed for attaching to night time bedside drainage bag connection. Reiterated that [employer] post acute care sampling packet has been sent to his home address where his friend is picking up his mail. Patient has 5 pouches, a measuring guide and my contact information ready to accompany him upon transfer.  If he is still here on Friday, I will perform pouch change. Our team will follow and remain available.  Please re-consult if needed between scheduled visits. Thanks, Ladona Mow, MSN, RN, [facility], CWOCN [phone])

## 2011-10-14 NOTE — Discharge Summary (Addendum)
Physician Discharge Summary  Patient ID: William Collier MRN: [id number] DOB/AGE: May 21, 1951 61 y.o.  Admit date: 10/07/2011 Discharge date: 10/15/2011  Admission Diagnoses: Bladder cancer  Discharge Diagnoses:  Bladder cancer Prostate cancer  Discharged Condition: good  Hospital Course:  Patient admitted after radical cystoprostatectomy with creation of ileal conduit for urinary diversion.  He had this surgery for bladder cancer.  He has residual left sided weakness due to a previous stroke.  He was initially managed in the Stepdown unit.  He worked with physical therapy who recommended Rehab consult for possible inpatient rehab.  He was able to be transferred to the floor where he began to pass flatus then have bowel movements.  He was able to advance his diet from NPO/sips to clear liquids to regular diet.  His pain was able to be controlled with PO medications.  His JP drain output dropped and it was removed.  His urine output was good.    Ostomy/Wound nurse evaluated the patient and recommended the following devices & instructions as listed under the "Consults" section of this D/C summary. A prescription for the below mentioned ostomy devices was faxed to [facility] on [facility] for the patient when he is out of SNF.  Patient was reassessed by Rehab and felt to be a better candidate for SNF.    The inferior portion of his incision began to drain serosanguinous fluid.  Staples were removed revealing a 1.5 cm superficial wound dehiscence.  This is being cared for by daily wound packing changes with 1/4 inch dry wound packing gauze.  He will need continued physical therapy and occupational therapy at the nursing facility.  The PT and OT therapists can assess him and his progress and future need as he progresses.  Consults: rehabilitation medicine and Wound/ostomy nurse.  WOC ostomy consult  Stoma type/location: right lower quadrant ileal conduit (urostomy)  Stomal  assessment/size: 1 and 1/8 round, slightly budded, pink, moist, sutures intact, 2 stents protruding.  Treatment options for stomal/peristomal skin: none required  Ostomy pouching: 1pc. Urostomy pouch with built-in convexity, Lawson# [id number]  Stent function and fact that they will "slip out" within the next 7-21 days covered.  Patient established with [employer] post op sampling program.   Significant Diagnostic Studies: Pathology: Bladder cancer pT0 N0 Mx; prostate cancer 3+3=6.  Treatments: surgery: Radical cystoprostatectomy with creation of ileal conduit.  Discharge Exam: Blood pressure 155/88, pulse 68, temperature 98.4 F (36.9 C), temperature source Oral, resp. rate 20, height 5\' 8"  (1.727 m), weight 79.9 kg (176 lb 2.4 oz), SpO2 98.00%. General: No acute distress, awake  Cardiovascular:  S1/S2 present, RRR   Chest: CTA-B  Abdomen: Soft, appropriately TTP, incision(s) clean/dry. Lower 1.5 cm of incision with superficial wound dehiscence; fascia intact. No purulent drainage.  Genitourinary: Stoma pink, bilateral ureter stents with good urine output.  Disposition: [facility]  Discharge Orders    Future Orders Please Complete By Expires   Discharge wound care:      Scheduling Instructions:   Change packing in lower inferior incision with 1/4 inch plain packing gauze daily.   Change dressing      Scheduling Instructions:   Routine urostomy care (see discharge summary for details of ostomy device).  Please note that there are two ureter stents protruding from the ostomy.  Do not remove these.     Medication List  As of 10/14/2011  1:29 PM   STOP taking these medications  finasteride 5 MG tablet      oxybutynin 5 MG tablet         TAKE these medications         amLODipine 10 MG tablet   Commonly known as: NORVASC   Take 10 mg by mouth every morning.      aspirin EC 325 MG tablet   Take 1 tablet (325 mg total) by mouth every morning.      bisacodyl 10 MG  suppository   Commonly known as: DULCOLAX   Place 1 suppository (10 mg total) rectally daily as needed.      cloNIDine 0.1 MG tablet   Commonly known as: CATAPRES   Take 0.1 mg by mouth 2 (two) times daily.      doxazosin 4 MG tablet   Commonly known as: CARDURA   Take 4 mg by mouth at bedtime.      enoxaparin 40 MG/0.4ML Soln   Commonly known as: LOVENOX   Inject 0.4 mLs (40 mg total) into the skin daily.      lisinopril 40 MG tablet   Commonly known as: PRINIVIL,ZESTRIL   Take 40 mg by mouth every morning.      omeprazole 20 MG capsule   Commonly known as: PRILOSEC   Take 20 mg by mouth daily.      oxyCODONE 5 MG immediate release tablet   Commonly known as: Oxy IR/ROXICODONE   Take 1-2 tablets (5-10 mg total) by mouth every 4 (four) hours as needed.      pravastatin 40 MG tablet   Commonly known as: PRAVACHOL   Take 40 mg by mouth at bedtime.      senna-docusate 8.6-50 MG per tablet   Commonly known as: Senokot-S   Take 1 tablet by mouth 2 (two) times daily.           Simethicone 80mg  PO QID prn gas discomfort.   Follow-up Information    Follow up with Saunders Revel, PA on 10/21/2011. (8:45 am- Staple removal)    Contact information:   [address] [facility] [place] [address] [phone]          Signed: Milford Cage 10/14/2011, 1:29 PM

## 2011-10-14 NOTE — Progress Notes (Signed)
Physical Therapy Treatment Patient Details Name: William Collier MRN: [id number] DOB: 01-06-1951 Today's Date: 10/14/2011  Cystoprostatectomy w/ urostomy POD #7 S/p CVA with L hemiparesis Wear L AFO 14:30 - 14:55 2 gt  PT Assessment/Plan  PT - Assessment/Plan Comments on Treatment Session: Pt eager to walk and show what he can do.  He is agreeable to SNF "[facility] don't want me".  "I can do for myself, have been for years" Pt would benefit from a more agressive Rehab @ [facility] vs SNF. PT Plan: Discharge plan remains appropriate Follow Up Recommendations: Inpatient Rehab Equipment Recommended: Defer to next venue PT Goals  Acute Rehab PT Goals PT Goal Formulation: With patient Pt will go Supine/Side to Sit: with supervision PT Goal: Supine/Side to Sit - Progress: Progressing toward goal Pt will go Sit to Supine/Side: with supervision PT Goal: Sit to Supine/Side - Progress: Progressing toward goal Pt will go Sit to Stand: with min assist PT Goal: Sit to Stand - Progress: Progressing toward goal Pt will go Stand to Sit: with supervision PT Goal: Stand to Sit - Progress: Progressing toward goal Pt will Transfer Bed to Chair/Chair to Bed: with supervision PT Transfer Goal: Bed to Chair/Chair to Bed - Progress: Progressing toward goal Pt will Ambulate: 51 - 150 feet;with min assist;with least restrictive assistive device PT Goal: Ambulate - Progress: Partly met  PT Treatment Precautions/Restrictions  Precautions Precautions: Fall Required Braces or Orthoses:  (uses AFO on left ankle) Other Brace/Splint: L AFO recommended for safe transfers. Restrictions Weight Bearing Restrictions: No Other Position/Activity Restrictions: L hemiplegia, wears AFO L ankle, ankle my invert w/ WB Mobility (including Balance) Bed Mobility Bed Mobility: Yes (Assisted pt back to bed) Sit to Supine: 2: Max assist Sit to Supine - Details (indicate cue type and reason): Max assist to support B LE up on  to bed and max assist to shift hips to center of bed Transfers Transfers: Yes Sit to Stand: 4: Min assist;From chair/3-in-1 Sit to Stand Details (indicate cue type and reason): increased time Stand to Sit: 4: Min assist;To bed Stand to Sit Details: increased time Ambulation/Gait Ambulation/Gait: Yes Ambulation/Gait Assistance: 4: Min assist Ambulation/Gait Assistance Details (indicate cue type and reason): with B shoes (L AFO) and SPC..........mild c/o abd discomfort.....Marland Kitchenmild c/o weakness "I haven't been walking in a while" Ambulation Distance (Feet): 75 Feet (75" X 2 with one standing rest break) Assistive device: Straight cane Gait Pattern: Step-to pattern;Ataxic;Decreased stance time - left Stairs: No Wheelchair Mobility Wheelchair Mobility: No    Exercise    End of Session PT - End of Session Equipment Utilized During Treatment: Gait belt;Left ankle foot orthosis Activity Tolerance: Patient tolerated treatment well Patient left: in bed;with call bell in reach General Behavior During Session: [facility] for tasks performed Cognition: [facility] for tasks performed  Felecia Shelling  PTA [facility]  Acute  Rehab Pager     [phone]

## 2011-10-14 NOTE — Progress Notes (Signed)
Urology Progress Note  Subjective:     No acute urologic events overnight. Positive flatus & BM. No nausea. Pain controlled.  Objective:  Patient Vitals for the past 24 hrs:  BP Temp Temp src Pulse Resp SpO2  10/14/11 0516 139/85 mmHg 99 F (37.2 C) Oral 73  20  100 %  10/13/11 2201 141/90 mmHg 98.7 F (37.1 C) Oral 80  20  98 %  10/13/11 1407 130/88 mmHg 98 F (36.7 C) Oral 72  19  98 %    Physical Exam: General:  No acute distress, awake Cardiovascular:    [x]   S1/S2 present, RRR  []   Irregularly irregular Chest:  CTA-B Abdomen:               []  Soft, appropriately TTP  []  Soft, NTTP  [x]  Soft, appropriately TTP, incision(s) clean/dry. Lower 1.5 cm of incision with superficial wound dehiscence; fascia intact. No purulent drainage.  Genitourinary: Stoma pink, bilateral ureter stents with good urine output.     I/O last 3 completed shifts: In: 1260 [P.O.:360; I.V.:900] Out: 4005 [Urine:4000; Stool:5]  Recent Labs  [facility] 10/14/11 0501 10/12/11 0510   HGB 9.2* 9.7*   WBC 9.2 8.3   PLT 313 269    Recent Labs  Basename 10/14/11 0501 10/12/11 0510   NA 142 142   K 3.8 3.9   CL 112 112   CO2 23 22   BUN 14 18   CREATININE 1.47* 1.55*   CALCIUM 8.5 8.4   GFRNONAA 50* 47*   GFRAA 58* 54*     No results found for this basename: PT:2,INR:2,APTT:2 in the last 72 hours   No components found with this basename: ABG:2     Assessment: Bladder cancer POD#7 radical cystoprostatectomy with ileal conduit.  Plan: -Wound packed with moist to dry packing.  Will begin daily wound packing changes with 1/4 inch dry packing. -Ready for discharge from hospital.  Will contact Case Manager regarding placement.  Natalia Leatherwood, MD [phone]

## 2011-10-15 LAB — WOUND CULTURE

## 2011-10-15 MED ORDER — SIMETHICONE 80 MG PO CHEW
80.0000 mg | CHEWABLE_TABLET | Freq: Four times a day (QID) | ORAL | Status: DC | PRN
  Filled 2011-10-15: qty 1

## 2011-10-15 MED ORDER — SIMETHICONE 80 MG PO CHEW: 80.0000 mg | CHEWABLE_TABLET | Freq: Four times a day (QID) | ORAL | Status: AC | PRN

## 2011-10-15 NOTE — Progress Notes (Signed)
OT Note:  Pt has already done bathing this am.  Doesn't feel up to trying LB dressing at this time.  Will check another time.  Linnell Camp, OTR/L [phone] 10/15/2011

## 2011-10-15 NOTE — Progress Notes (Signed)
Met with pt this am to assist with D/C planning to SNF. Pt is deciding between [facility] and [facility]. Liaison from [facility] will meet with pt 10:30a to answer pt's questions regarding SNF. Message left for [facility] Admissions Coordinator to call pt's room and speak with pt, as well. Pt plans to d/c to SNF this afternoon. Will assist with d/c planning to SNF today.

## 2011-10-15 NOTE — Progress Notes (Signed)
Pt has choosen [facility] for ST SNF placement. Bed is available today. WIll alert MD and assist with d/c planning to SNF.

## 2011-10-15 NOTE — Progress Notes (Signed)
I again attempted to speak to family about patient's discharge needs after short CIR stay and family could not provide 24 hr assistance. Patient states, he has a cousin, but doesn't know his phone # and cousin works during the day. Informed William Collier that we would not be able to offer a bed at [facility] due to limited family/cg support. Patient verbalizes understanding. CIR signing off at this time. SW informed. Thanks- Toni Amend admission coordinator [phone].

## 2011-10-15 NOTE — Consult Note (Signed)
WOC ostomy follow-up  consult  Stoma type/location: Pt with urostomy, pouch intact with good seal.  Pouch demonstration performed yesterday.  Supplies in room for bedside staff to use.  Pt denies further questions at this time. Will not plan to follow further unless re-consulted.  [address] RN, MSN, [employer]  [phone]

## 2011-10-15 NOTE — Progress Notes (Signed)
CARE MANAGEMENT NOTE 10/15/2011  Patient:  [facility]   Account Number:  [id number]  Date Initiated:  10/11/2011  Documentation initiated by:  Colleen Can  Subjective/Objective Assessment:   dx bladder cancer     Action/Plan:   Plans are for [facility]  1st choice and 2nd choice ST snf   Anticipated DC Date:  10/14/2011   Anticipated DC Plan:  IP REHAB FACILITY  In-house referral  Clinical Social Worker      DC Associate Professor  CM consult      PAC Choice  IP REHAB   Choice offered to / List presented to:  C-1 Patient   DME arranged  NA      DME agency  NA     HH arranged  NA      Status of service:  Completed, signed off Medicare Important Message given?   (If response is "NO", the following Medicare IM given date fields will be blank) Date Medicare IM given:   Date Additional Medicare IM given:    Discharge Disposition:  SKILLED NURSING FACILITY  Per UR Regulation:    Comments:  10/15/2011 Colleen Can, RN BSN CCM [phone] PT WAS DECLINED ADMISSION TO [facility]. 2ND CHOICE WAS SNF. WAS REFERRED TO CSW. PT WAS D/C'ED TO ST SNF 10/15/2011

## 2011-10-15 NOTE — Progress Notes (Signed)
Urology Progress Note  Subjective:     No acute urologic events overnight. Positive flatus & BM. No nausea. No requiring much pain medication.  Objective:  Patient Vitals for the past 24 hrs:  BP Temp Temp src Pulse Resp SpO2  10/15/11 0507 131/83 mmHg 98.7 F (37.1 C) Oral 64  20  99 %  10/14/11 2204 120/77 mmHg 98.6 F (37 C) Oral 72  20  98 %  10/14/11 1417 123/83 mmHg 98.4 F (36.9 C) Oral 82  20  99 %  10/14/11 1021 155/88 mmHg - - - - -  10/14/11 0900 162/91 mmHg 98.4 F (36.9 C) Oral 68  20  98 %    Physical Exam: General:  No acute distress, awake Cardiovascular:    [x]   S1/S2 present, RRR  []   Irregularly irregular Chest:  CTA-B Abdomen:               []  Soft, appropriately TTP  []  Soft, NTTP  [x]  Soft, appropriately TTP, incision(s) clean/dry. Lower 1.5 cm of incision with superficial wound dehiscence; fascia intact. No purulent drainage.  Genitourinary: Stoma pink, bilateral ureter stents with good urine output.     I/O last 3 completed shifts: In: 720 [P.O.:720] Out: 3081 [Urine:3075; Stool:6]  Recent Labs  [facility] 10/14/11 0501   HGB 9.2*   WBC 9.2   PLT 313    Recent Labs  Basename 10/14/11 0501   NA 142   K 3.8   CL 112   CO2 23   BUN 14   CREATININE 1.47*   CALCIUM 8.5   GFRNONAA 50*   GFRAA 58*     No results found for this basename: PT:2,INR:2,APTT:2 in the last 72 hours   No components found with this basename: ABG:2     Assessment: Bladder cancer POD#8 radical cystoprostatectomy with ileal conduit.  Plan: -Wound packed with dry 1/4 inch packing; cultures without growth and being reincubated. Continue daily wound packing changes. -Ready for discharge from hospital. Appreciate SW help with patient placement.  Natalia Leatherwood, MD [phone]

## 2011-10-15 NOTE — Discharge Instructions (Signed)
DISCHARGE INSTRUCTIONS FOR RADICAL CYSTOPROSTATECTOMY  MEDICATIONS: patient is discharged with:   Resume all your other meds from home - except do not take any other pain meds  that you may have at home.  ACTIVITY 1. No heavy lifting >10 pounds for 6 weeks 2. No sexual activity for 6 weeks 3. No strenuous activity for 6 weeks 4. No driving while on narcotic pain medications 5. Drink plenty of water 6. Continue to walk at home - you can still get blood clots when you are at  home, so keep active, but don't over do it. 7. You may resume normal activity in 2 wks (working but no heavy lifting) 8. DO NOT STRAIN TO HAVE A BOWEL MOVEMENT.  DIET 1. Go slow with your diet. You had a big surgery and your appetite will not be  as it was before surgery for quite some time. You may also notice a metallic  taste in your mouth, this should go away in a few weeks. Make sure you stay well  hydrated and drink lots of water. You can buy Boost or Ensure supplements to  drink between meals and any other time as well to make sure you are getting  adequate nutrition. 2. Do not eat a lot of heavy meals (ex hamburgers, steak) right away - you may  get sick to your stomach. 3. Do not strain to have a bowel movement.  If you have not been able to have a  BM after several day, go to your local pharmacy and obtain Mild of Magnesia and  take it as instructed on the bottle.   BATHING/WOUND CARE 1. You can shower if you cover your wound.  Your staples will be evaluated for possible removal at you follow up appointment next week.  2. The lower part of your incision should be packed daily with 1/4 inch plain packing. This should be changed daily. 3. You do not need to dress your surgical wounds.  4. DO NOT SUBMERGE YOUR STOMA OR WOUND UNDER WATER.  OSTOMY 1. Change your ostomy device as needed. A prescription for the device you are using now has been sent to [facility] on [address]. 2. Try  not to dislodge the stents coming out of your stoma when changing the device.  If they become dislodged that is ok.  They my be in place as long as 21 days after surgery.   SIGNS/SYMPTOMS TO CALL: 1. Please call us if you have a fever greater than 101.5, uncontrolled  nausea/vomiting, uncontrolled pain, dizziness, unable to urinate, bloody urine,  chest pain, shortness of breath, leg swelling, leg pain, or any other concerns  or questions.  You can reach [place] at [phone].

## 2011-11-18 ENCOUNTER — Emergency Department (HOSPITAL_COMMUNITY): Payer: Medicare Other

## 2011-11-18 ENCOUNTER — Inpatient Hospital Stay (HOSPITAL_COMMUNITY)
Admission: EM | Admit: 2011-11-18 | Discharge: 2011-11-22 | DRG: 683 | Disposition: A | Discharge status: Home Health | Payer: Medicare Other | Attending: Internal Medicine | Admitting: Internal Medicine

## 2011-11-18 ENCOUNTER — Encounter (HOSPITAL_COMMUNITY): Payer: Self-pay | Admitting: *Deleted

## 2011-11-18 DIAGNOSIS — M79609 Pain in unspecified limb: Secondary | ICD-10-CM

## 2011-11-18 DIAGNOSIS — E872 Acidosis, unspecified: Secondary | ICD-10-CM | POA: Diagnosis present

## 2011-11-18 DIAGNOSIS — E860 Dehydration: Secondary | ICD-10-CM

## 2011-11-18 DIAGNOSIS — E870 Hyperosmolality and hypernatremia: Secondary | ICD-10-CM | POA: Diagnosis present

## 2011-11-18 DIAGNOSIS — K648 Other hemorrhoids: Secondary | ICD-10-CM

## 2011-11-18 DIAGNOSIS — Z888 Allergy status to other drugs, medicaments and biological substances status: Secondary | ICD-10-CM

## 2011-11-18 DIAGNOSIS — L03039 Cellulitis of unspecified toe: Secondary | ICD-10-CM

## 2011-11-18 DIAGNOSIS — M1990 Unspecified osteoarthritis, unspecified site: Secondary | ICD-10-CM

## 2011-11-18 DIAGNOSIS — Z79899 Other long term (current) drug therapy: Secondary | ICD-10-CM

## 2011-11-18 DIAGNOSIS — K5730 Diverticulosis of large intestine without perforation or abscess without bleeding: Secondary | ICD-10-CM

## 2011-11-18 DIAGNOSIS — I10 Essential (primary) hypertension: Secondary | ICD-10-CM

## 2011-11-18 DIAGNOSIS — R112 Nausea with vomiting, unspecified: Secondary | ICD-10-CM

## 2011-11-18 DIAGNOSIS — B9689 Other specified bacterial agents as the cause of diseases classified elsewhere: Secondary | ICD-10-CM | POA: Diagnosis present

## 2011-11-18 DIAGNOSIS — N390 Urinary tract infection, site not specified: Secondary | ICD-10-CM | POA: Diagnosis present

## 2011-11-18 DIAGNOSIS — K560 Paralytic ileus: Secondary | ICD-10-CM | POA: Diagnosis present

## 2011-11-18 DIAGNOSIS — K219 Gastro-esophageal reflux disease without esophagitis: Secondary | ICD-10-CM | POA: Diagnosis present

## 2011-11-18 DIAGNOSIS — N19 Unspecified kidney failure: Secondary | ICD-10-CM

## 2011-11-18 DIAGNOSIS — Z8546 Personal history of malignant neoplasm of prostate: Secondary | ICD-10-CM

## 2011-11-18 DIAGNOSIS — E785 Hyperlipidemia, unspecified: Secondary | ICD-10-CM

## 2011-11-18 DIAGNOSIS — R197 Diarrhea, unspecified: Secondary | ICD-10-CM

## 2011-11-18 DIAGNOSIS — N183 Chronic kidney disease, stage 3 unspecified: Secondary | ICD-10-CM | POA: Diagnosis present

## 2011-11-18 DIAGNOSIS — Z87891 Personal history of nicotine dependence: Secondary | ICD-10-CM

## 2011-11-18 DIAGNOSIS — I959 Hypotension, unspecified: Secondary | ICD-10-CM | POA: Diagnosis present

## 2011-11-18 DIAGNOSIS — N200 Calculus of kidney: Secondary | ICD-10-CM | POA: Diagnosis present

## 2011-11-18 DIAGNOSIS — R066 Hiccough: Secondary | ICD-10-CM | POA: Diagnosis present

## 2011-11-18 DIAGNOSIS — Z860100 Personal history of colon polyps, unspecified: Secondary | ICD-10-CM

## 2011-11-18 DIAGNOSIS — M25519 Pain in unspecified shoulder: Secondary | ICD-10-CM

## 2011-11-18 DIAGNOSIS — M129 Arthropathy, unspecified: Secondary | ICD-10-CM | POA: Diagnosis present

## 2011-11-18 DIAGNOSIS — IMO0002 Reserved for concepts with insufficient information to code with codable children: Secondary | ICD-10-CM

## 2011-11-18 DIAGNOSIS — I129 Hypertensive chronic kidney disease with stage 1 through stage 4 chronic kidney disease, or unspecified chronic kidney disease: Secondary | ICD-10-CM | POA: Diagnosis present

## 2011-11-18 DIAGNOSIS — E869 Volume depletion, unspecified: Secondary | ICD-10-CM | POA: Diagnosis present

## 2011-11-18 DIAGNOSIS — K567 Ileus, unspecified: Secondary | ICD-10-CM

## 2011-11-18 DIAGNOSIS — Z8551 Personal history of malignant neoplasm of bladder: Secondary | ICD-10-CM

## 2011-11-18 DIAGNOSIS — I6350 Cerebral infarction due to unspecified occlusion or stenosis of unspecified cerebral artery: Secondary | ICD-10-CM

## 2011-11-18 DIAGNOSIS — Z87898 Personal history of other specified conditions: Secondary | ICD-10-CM

## 2011-11-18 DIAGNOSIS — I69998 Other sequelae following unspecified cerebrovascular disease: Secondary | ICD-10-CM

## 2011-11-18 DIAGNOSIS — D631 Anemia in chronic kidney disease: Secondary | ICD-10-CM | POA: Diagnosis present

## 2011-11-18 DIAGNOSIS — Z880 Allergy status to penicillin: Secondary | ICD-10-CM

## 2011-11-18 DIAGNOSIS — R29898 Other symptoms and signs involving the musculoskeletal system: Secondary | ICD-10-CM | POA: Diagnosis present

## 2011-11-18 DIAGNOSIS — N179 Acute kidney failure, unspecified: Secondary | ICD-10-CM

## 2011-11-18 LAB — DIFFERENTIAL
Eosinophils Absolute: 0 10*3/uL (ref 0.0–0.7)
Eosinophils Relative: 0 % (ref 0–5)
Lymphocytes Relative: 25 % (ref 12–46)
Lymphs Abs: 2 10*3/uL (ref 0.7–4.0)
Monocytes Absolute: 0.4 10*3/uL (ref 0.1–1.0)
Monocytes Relative: 5 % (ref 3–12)

## 2011-11-18 LAB — COMPREHENSIVE METABOLIC PANEL
ALT: 12 U/L (ref 0–53)
BUN: 108 mg/dL — ABNORMAL HIGH (ref 6–23)
CO2: 23 mEq/L (ref 19–32)
Calcium: 10.2 mg/dL (ref 8.4–10.5)
Creatinine, Ser: 14.43 mg/dL — ABNORMAL HIGH (ref 0.50–1.35)
GFR calc Af Amer: 4 mL/min — ABNORMAL LOW (ref >90)
GFR calc non Af Amer: 3 mL/min — ABNORMAL LOW (ref >90)
Glucose, Bld: 131 mg/dL — ABNORMAL HIGH (ref 70–99)
Sodium: 136 mEq/L (ref 135–145)
Total Protein: 8.5 g/dL — ABNORMAL HIGH (ref 6.0–8.3)

## 2011-11-18 LAB — CBC
HCT: 38.5 % — ABNORMAL LOW (ref 39.0–52.0)
Hemoglobin: 13.3 g/dL (ref 13.0–17.0)
MCH: 29.6 pg (ref 26.0–34.0)
MCV: 85.6 fL (ref 78.0–100.0)
RBC: 4.5 MIL/uL (ref 4.22–5.81)
WBC: 7.9 10*3/uL (ref 4.0–10.5)

## 2011-11-18 LAB — URINALYSIS, ROUTINE W REFLEX MICROSCOPIC
Nitrite: NEGATIVE
Specific Gravity, Urine: 1.021 (ref 1.005–1.030)
Urobilinogen, UA: 0.2 mg/dL (ref 0.0–1.0)

## 2011-11-18 LAB — URINE MICROSCOPIC-ADD ON

## 2011-11-18 MED ORDER — SODIUM CHLORIDE 0.9 % IV BOLUS (SEPSIS)
1000.0000 mL | Freq: Once | INTRAVENOUS | Status: AC
  Administered 2011-11-18: 1000 mL via INTRAVENOUS

## 2011-11-18 MED ORDER — SODIUM CHLORIDE 0.9 % IV BOLUS (SEPSIS)
500.0000 mL | INTRAVENOUS | Status: AC
  Administered 2011-11-18: 500 mL via INTRAVENOUS

## 2011-11-18 MED ORDER — ONDANSETRON HCL 4 MG/2ML IJ SOLN
4.0000 mg | INTRAMUSCULAR | Status: DC | PRN
  Administered 2011-11-18 – 2011-11-19 (×4): 4 mg via INTRAVENOUS
  Filled 2011-11-18 (×4): qty 2

## 2011-11-18 MED ORDER — MORPHINE SULFATE 4 MG/ML IJ SOLN
4.0000 mg | Freq: Once | INTRAMUSCULAR | Status: AC
  Administered 2011-11-18: 4 mg via INTRAVENOUS
  Filled 2011-11-18: qty 1

## 2011-11-18 MED ORDER — SODIUM CHLORIDE 0.9 % IV SOLN
INTRAVENOUS | Status: DC
  Administered 2011-11-19 – 2011-11-20 (×2): via INTRAVENOUS

## 2011-11-18 NOTE — ED Notes (Signed)
Abd incision healed

## 2011-11-18 NOTE — ED Provider Notes (Signed)
History     CSN: [id number]  Arrival date & time 11/18/11  1524   First MD Initiated Contact with Patient 11/18/11 1711      Chief Complaint  Patient presents with  . Emesis  . Hypotension    (Consider location/radiation/quality/duration/timing/severity/associated sxs/prior treatment) HPI  Patient relates he had colon cancer and had resection and colostomy done on February 2 by Dr. Margarita Grizzle. He states last week he started having nausea and vomiting with diarrhea. He states is mainly vomiting at night and having 3-4 episodes a night. Is also having 2-3 diarrhea stools a day. He denies fever. He states he's thirsty and he feels weak, lightheaded, and states is having pain around his colostomy site. He relates he was seen last week by Dr. Margarita Grizzle and was told his wounds were healing well.  PCP Dr. Sander Radon  Past Medical History  Diagnosis Date  . Hypertension 08-20-11  . Dysrhythmia 08-20-11    skip beat, hx. RBBB  . GERD (gastroesophageal reflux disease) 08-20-11    controls with diet, hx. colon polyps(benign)  . Cancer 08-20-11    bladder cancer  . Arthritis   . Stroke 08-20-11    '08-Lt. sided weakness with residual > lt. arm.  . Chronic kidney disease     Past Surgical History  Procedure Date  . Bladder surgery 08-20-11    07-01-11 bladder surgery, past hx. lithotripsy  . Shoulder arthrotomy 08-20-11    left shoulder with retained pins  . Knee arthroscopy 08-20-11    bil. knee scopes-torn ligament  . Cystoscopy w/ ureteral stent placement 08/21/2011    Procedure: CYSTOSCOPY WITH RETROGRADE PYELOGRAM/URETERAL STENT PLACEMENT;  Surgeon: Milford Cage, MD;  Location: [facility];  Service: Urology;  Laterality: Left;  . Cystoscopy with biopsy 08/21/2011    Procedure: CYSTOSCOPY WITH BIOPSY;  Surgeon: Milford Cage, MD;  Location: [facility];  Service: Urology;  Laterality: N/A;  . Transurethral resection of bladder tumor 08/21/2011    Procedure: TRANSURETHRAL  RESECTION OF BLADDER TUMOR (TURBT);  Surgeon: Milford Cage, MD;  Location: [facility];  Service: Urology;  Laterality: N/A;  need PK gyrus  . Cystectomy 10/07/2011    Procedure: CYSTECTOMY COMPLETE;  Surgeon: Milford Cage, MD;  Location: [facility];  Service: Urology;  Laterality: Bilateral;  Radical Cystoprostatectomy Bilateral Pelvic Lymph Node Dissection Formation Of Ileal Conduit   . Prostatectomy 10/07/2011    Procedure: PROSTATECTOMY;  Surgeon: Milford Cage, MD;  Location: [facility];  Service: Urology;  Laterality: N/A;  . Lymph node dissection 10/07/2011    Procedure: LYMPH NODE DISSECTION;  Surgeon: Milford Cage, MD;  Location: [facility];  Service: Urology;  Laterality: Bilateral;    No family history on file.  History  Substance Use Topics  . Smoking status: Former Smoker -- 1.0 packs/day for 40 years    Types: Cigarettes    Quit date: 10/28/2011  . Smokeless tobacco: Not on file  . Alcohol Use: No   lives at home Lives alone    Review of Systems  All other systems reviewed and are negative.    Allergies  Penicillins and Pregabalin  Home Medications   Current Outpatient Rx  Name Route Sig Dispense Refill  . AMLODIPINE BESYLATE 10 MG PO TABS Oral Take 10 mg by mouth every morning.     Marland Kitchen CLONIDINE HCL 0.1 MG PO TABS Oral Take 0.1 mg by mouth 2 (two) times daily.     Marland Kitchen DOXAZOSIN MESYLATE 4 MG PO TABS  Oral Take 4 mg by mouth at bedtime.     Marland Kitchen LISINOPRIL 40 MG PO TABS Oral Take 40 mg by mouth every morning.     Marland Kitchen OMEPRAZOLE 20 MG PO CPDR Oral Take 20 mg by mouth daily.    Marland Kitchen PRAVASTATIN SODIUM 40 MG PO TABS Oral Take 40 mg by mouth at bedtime.       BP 124/88  Pulse 68  Temp(Src) 97.5 F (36.4 C) (Oral)  Resp 16  SpO2 99%  Vital signs normal    Physical Exam  Nursing note and vitals reviewed. Constitutional: He is oriented to person, place, and time. He appears well-developed and well-nourished.  Non-toxic appearance. He does not appear ill.  No distress.  HENT:  Head: Normocephalic and atraumatic.  Right Ear: External ear normal.  Left Ear: External ear normal.  Nose: Nose normal. No mucosal edema or rhinorrhea.  Mouth/Throat: Mucous membranes are normal. No dental abscesses or uvula swelling.       Mucous membranes dry, lips dry  Eyes: Conjunctivae and EOM are normal. Pupils are equal, round, and reactive to light.  Neck: Normal range of motion and full passive range of motion without pain. Neck supple.  Cardiovascular: Normal rate, regular rhythm and normal heart sounds.  Exam reveals no gallop and no friction rub.   No murmur heard. Pulmonary/Chest: Effort normal and breath sounds normal. No respiratory distress. He has no wheezes. He has no rhonchi. He has no rales. He exhibits no tenderness and no crepitus.  Abdominal: Soft. Normal appearance and bowel sounds are normal. He exhibits no distension. There is no tenderness. There is no rebound and no guarding.  Musculoskeletal: Normal range of motion. He exhibits no edema and no tenderness.       Moves all extremities well.   Neurological: He is alert and oriented to person, place, and time. He has normal strength. No cranial nerve deficit.  Skin: Skin is warm, dry and intact. No rash noted. No erythema. No pallor.  Psychiatric: He has a normal mood and affect. His speech is normal and behavior is normal. His mood appears not anxious.    ED Course  Procedures (including critical care time)  Review of patient's chart shows he had bladder cancer and had a ileal urostomy placed on February 2.  The patient was given IV fluids for his dehydration and hypotension. His blood pressure rapidly improved.   Medications  0.9 %  sodium chloride infusion (100 mL/hr Intravenous Rate/Dose Change 11/18/11 2124)  ondansetron (ZOFRAN) injection 4 mg (4 mg Intravenous Given 11/18/11 2022)  sodium chloride 0.9 % bolus 1,000 mL (1000 mL Intravenous Given 11/18/11 2023)  sodium chloride 0.9 %  bolus 500 mL (500 mL Intravenous Given 11/18/11 1917)  morphine 4 MG/ML injection 4 mg (4 mg Intravenous Given 11/18/11 1918)    Patient has marked worsening of his renal function since February.  22:22 Dr Blake Divine admit to team 8, tele, Dr Susie Cassette, asks for renal consult tonight  22:30 Dr Caryn Section states to give IV fluids and see if improving overnight. If not will need to be transferred to [facility] tomorrow, asks her to contact Dr Hyman Hopes around 8 am tomorrow.   Results for orders placed during the hospital encounter of 11/18/11  CBC      Component Value Range   WBC 7.9  4.0 - 10.5 (K/uL)   RBC 4.50  4.22 - 5.81 (MIL/uL)   Hemoglobin 13.3  13.0 - 17.0 (g/dL)   HCT 45.4 (*)  39.0 - 52.0 (%)   MCV 85.6  78.0 - 100.0 (fL)   MCH 29.6  26.0 - 34.0 (pg)   MCHC 34.5  30.0 - 36.0 (g/dL)   RDW 16.1  09.6 - 04.5 (%)   Platelets 378  150 - 400 (K/uL)  DIFFERENTIAL      Component Value Range   Neutrophils Relative 69  43 - 77 (%)   Neutro Abs 5.4  1.7 - 7.7 (K/uL)   Lymphocytes Relative 25  12 - 46 (%)   Lymphs Abs 2.0  0.7 - 4.0 (K/uL)   Monocytes Relative 5  3 - 12 (%)   Monocytes Absolute 0.4  0.1 - 1.0 (K/uL)   Eosinophils Relative 0  0 - 5 (%)   Eosinophils Absolute 0.0  0.0 - 0.7 (K/uL)   Basophils Relative 0  0 - 1 (%)   Basophils Absolute 0.0  0.0 - 0.1 (K/uL)  COMPREHENSIVE METABOLIC PANEL      Component Value Range   Sodium 136  135 - 145 (mEq/L)   Potassium 4.3  3.5 - 5.1 (mEq/L)   Chloride 92 (*) 96 - 112 (mEq/L)   CO2 23  19 - 32 (mEq/L)   Glucose, Bld 131 (*) 70 - 99 (mg/dL)   BUN 409 (*) 6 - 23 (mg/dL)   Creatinine, Ser 81.19 (*) 0.50 - 1.35 (mg/dL)   Calcium 14.7  8.4 - 10.5 (mg/dL)   Total Protein 8.5 (*) 6.0 - 8.3 (g/dL)   Albumin 4.1  3.5 - 5.2 (g/dL)   AST 9  0 - 37 (U/L)   ALT 12  0 - 53 (U/L)   Alkaline Phosphatase 62  39 - 117 (U/L)   Total Bilirubin 0.4  0.3 - 1.2 (mg/dL)   GFR calc non Af Amer 3 (*) >90 (mL/min)   GFR calc Af Amer 4 (*) >90 (mL/min)  URINALYSIS, ROUTINE W  REFLEX MICROSCOPIC      Component Value Range   Color, Urine RED (*) YELLOW    APPearance TURBID (*) CLEAR    Specific Gravity, Urine 1.021  1.005 - 1.030    pH 8.5 (*) 5.0 - 8.0    Glucose, UA NEGATIVE  NEGATIVE (mg/dL)   Hgb urine dipstick NEGATIVE  NEGATIVE    Bilirubin Urine SMALL (*) NEGATIVE    Ketones, ur 15 (*) NEGATIVE (mg/dL)   Protein, ur 829 (*) NEGATIVE (mg/dL)   Urobilinogen, UA 0.2  0.0 - 1.0 (mg/dL)   Nitrite NEGATIVE  NEGATIVE    Leukocytes, UA LARGE (*) NEGATIVE   URINE MICROSCOPIC-ADD ON      Component Value Range   Squamous Epithelial / LPF RARE  RARE    WBC, UA 7-10  <3 (WBC/hpf)   Bacteria, UA MANY (*) RARE    Crystals CA OXALATE CRYSTALS (*) NEGATIVE    Urine-Other MUCOUS PRESENT     Laboratory interpretation all normal except marked renal failure compared to one month ago when he had renal insufficiency   Ct Abdomen Pelvis Wo Contrast  11/18/2011  *RADIOLOGY REPORT*  Clinical Data: Abdominal pain, vomiting, diarrhea.  Hypotension. History of bladder cancer.  Recent colostomy.  CT ABDOMEN AND PELVIS WITHOUT CONTRAST  Technique:  Multidetector CT imaging of the abdomen and pelvis was performed following the standard protocol without intravenous contrast.  Comparison: 09/09/2011; 10/07/2011  Findings: Subsegmental atelectasis noted in both lower lobes. Scattered small hepatic hypodensities probably represent cysts but are technically nonspecific on today's noncontrast CT scan.  The spleen  and adrenal glands appear normal.  Bilateral nephrolithiasis is present, and there is a 4 mm calculus at the right UPJ without obstruction.  There is fullness of the left proximal ureter, with poor visualization of the distal ureter.  The ileal conduit is indistinct, as is its attachment to the ostomy site.  Orally administered contrast extends through loops of small bowel and to the level of the rectum.  There are air-fluid levels and scattered pelvic loops of small bowel, which are  borderline dilated.  The colon does not appear dilated.  No pathologic adenopathy is currently observed.  Prior cystoprostatectomy noted stable sclerotic marginated lesion in the left iliac bone noted, not significantly changed from 03/18/2011.  IMPRESSION:  1.  Bilateral nephrolithiasis.  There is an apparently nonobstructive 4 mm right UPJ calculus. 2.  Scattered air-fluid levels and borderline dilated loops of small bowel.  However, orally administered contrast makes its way through to the rectum.  Query mild ileus. 3.  The distal ureters and their attachment to the ileal conduit are poorly seen, and better visualization would probably require delayed phase postcontrast imaging.  No definite hydroureter to suggest a urinary obstruction.  No abnormal fluid collection is observed around the ostomy site, and no peristomal hernia is noted. 4.  Hypodense hepatic lesions are similar to prior MRI and probably represent cysts.  Original Report Authenticated By: Dellia Cloud, M.D.      1. Nausea vomiting and diarrhea   2. Dehydration   3. Renal failure     Plan admission  CRITICAL CARE Performed by: Devoria Albe L   Total critical care time:31 minutes  Critical care time was exclusive of separately billable procedures and treating other patients.  Critical care was necessary to treat or prevent imminent or life-threatening deterioration.  Critical care was time spent personally by me on the following activities: development of treatment plan with patient and/or surrogate as well as nursing, discussions with consultants, evaluation of patient's response to treatment, examination of patient, obtaining history from patient or surrogate, ordering and performing treatments and interventions, ordering and review of laboratory studies, ordering and review of radiographic studies, pulse oximetry and re-evaluation of patient's condition.   MDM          Ward Givens, MD 11/18/11 2240

## 2011-11-18 NOTE — ED Notes (Signed)
Patient drinking contrast dye at this time.

## 2011-11-18 NOTE — ED Notes (Signed)
Pt reports weakness and lightheadedness x 1 week ago.  Pt recently had colostomy placed 2.5 weeks ago.  Pt reports pain around the incision area.

## 2011-11-18 NOTE — H&P (Signed)
PCP:   Julieanne Manson, MD, MD   Chief Complaint:  Diarrhea for one week.  HPI: 61 year old male with h/o bladder cancer and prostate cancer s/p prostatectomy with ileal conduit and urostomy in place, comes in for one week of nausea,vomiting and diarrhea, associated with mild abdominal pain. Diarrhea is watery. No hematemesis . He denies any travel, or use of antibiotics recently. He underwent a CT  Abdomen showing bilateral renal stones and possible mild ileus. The urinary diversion with the ileal conduit is poorly seen in the CT. He was also found to be hypotensive and  in acute renal failure. Renal consult was obtained from Dr Caryn Section, recommended hydration and to call Dr Hyman Hopes in am.   Review of Systems:  The patient denies , fever, weight loss,, vision loss, decreased hearing, hoarseness, chest pain, syncope, dyspnea on exertion, peripheral edema, balance deficits, hemoptysis,  melena, hematochezia, severe indigestion/heartburn, hematuria, , genital sores, muscle weakness, suspicious skin lesions, transient blindness, difficulty walking, depression, unusual weight change, abnormal bleeding, enlarged lymph nodes, angioedema, and breast masses.  Past Medical History: Past Medical History  Diagnosis Date  . Hypertension 08-20-11  . Dysrhythmia 08-20-11    skip beat, hx. RBBB  . GERD (gastroesophageal reflux disease) 08-20-11    controls with diet, hx. colon polyps(benign)  . Cancer 08-20-11    bladder cancer  . Arthritis   . Stroke 08-20-11    '08-Lt. sided weakness with residual > lt. arm.  . Chronic kidney disease    Past Surgical History  Procedure Date  . Bladder surgery 08-20-11    07-01-11 bladder surgery, past hx. lithotripsy  . Shoulder arthrotomy 08-20-11    left shoulder with retained pins  . Knee arthroscopy 08-20-11    bil. knee scopes-torn ligament  . Cystoscopy w/ ureteral stent placement 08/21/2011    Procedure: CYSTOSCOPY WITH RETROGRADE PYELOGRAM/URETERAL STENT  PLACEMENT;  Surgeon: Milford Cage, MD;  Location: [facility];  Service: Urology;  Laterality: Left;  . Cystoscopy with biopsy 08/21/2011    Procedure: CYSTOSCOPY WITH BIOPSY;  Surgeon: Milford Cage, MD;  Location: [facility];  Service: Urology;  Laterality: N/A;  . Transurethral resection of bladder tumor 08/21/2011    Procedure: TRANSURETHRAL RESECTION OF BLADDER TUMOR (TURBT);  Surgeon: Milford Cage, MD;  Location: [facility];  Service: Urology;  Laterality: N/A;  need PK gyrus  . Cystectomy 10/07/2011    Procedure: CYSTECTOMY COMPLETE;  Surgeon: Milford Cage, MD;  Location: [facility];  Service: Urology;  Laterality: Bilateral;  Radical Cystoprostatectomy Bilateral Pelvic Lymph Node Dissection Formation Of Ileal Conduit   . Prostatectomy 10/07/2011    Procedure: PROSTATECTOMY;  Surgeon: Milford Cage, MD;  Location: [facility];  Service: Urology;  Laterality: N/A;  . Lymph node dissection 10/07/2011    Procedure: LYMPH NODE DISSECTION;  Surgeon: Milford Cage, MD;  Location: [facility];  Service: Urology;  Laterality: Bilateral;    Medications: Prior to Admission medications   Medication Sig Start Date End Date Taking? Authorizing Provider  amLODipine (NORVASC) 10 MG tablet Take 10 mg by mouth every morning.    Yes Historical Provider, MD  cloNIDine (CATAPRES) 0.1 MG tablet Take 0.1 mg by mouth 2 (two) times daily.    Yes Historical Provider, MD  doxazosin (CARDURA) 4 MG tablet Take 4 mg by mouth at bedtime.    Yes Historical Provider, MD  lisinopril (PRINIVIL,ZESTRIL) 40 MG tablet Take 40 mg by mouth every morning.    Yes Historical Provider, MD  omeprazole (PRILOSEC) 20 MG capsule Take 20 mg by mouth daily.   Yes Historical Provider, MD  pravastatin (PRAVACHOL) 40 MG tablet Take 40 mg by mouth at bedtime.    Yes Historical Provider, MD    Allergies:   Allergies  Allergen Reactions  . Penicillins Other (See Comments)    HAD WHEN WAS A CHILD   . Pregabalin  Swelling    Social History:  reports that he quit smoking about 3 weeks ago. His smoking use included Cigarettes. He has a 40 pack-year smoking history. He does not have any smokeless tobacco history on file. He reports that he uses illicit drugs (Marijuana) about 3 times per week. He reports that he does not drink alcohol.   Family History: No family history on file.  Physical Exam: Filed Vitals:   11/18/11 1658 11/18/11 1859 11/18/11 2021 11/18/11 2342  BP: 83/65 124/88 104/68 102/65  Pulse: 68  71 82  Temp: 97.5 F (36.4 C)   98 F (36.7 C)  TempSrc: Oral   Oral  Resp: 16  22 20   SpO2: 99%   99%   Constitutional: Vital signs reviewed.  Patient is a well-developed and well-nourished  in no acute distress and cooperative with exam. Alert and oriented x3.  Head: Normocephalic and atraumatic Mouth: no erythema or exudates, dry mucous membranes Eyes: PERRL, EOMI, conjunctivae normal, No scleral icterus.  Neck: Supple, Trachea midline normal ROM, No JVD, mass, thyromegaly, or carotid bruit present.  Cardiovascular: RRR, S1 normal, S2 normal, no MRG, pulses symmetric and intact bilaterally Pulmonary/Chest: CTAB, no wheezes, rales, or rhonchi Abdominal: Soft. Non-tender, non-distended, urostomy site is clean.  bowel sounds are normal, no masses, organomegaly, or guarding present.  GU: no CVA tenderness Musculoskeletal: No joint deformities, erythema, or stiffness, ROM full and no nontender Hematology: no cervical, inginal, or axillary adenopathy.  Neurological: A&O x3,right sided residual weakness from prior stroke. Skin: Warm, dry and intact. No rash, cyanosis, or clubbing.  Psychiatric: Normal mood and affect. speech and behavior is normal. J   Labs on Admission:   [facility] 11/18/11 1916  NA 136  K 4.3  CL 92*  CO2 23  GLUCOSE 131*  BUN 108*  CREATININE 14.43*  CALCIUM 10.2  MG --  PHOS --    Basename 11/18/11 1916  AST 9  ALT 12  ALKPHOS 62  BILITOT 0.4  PROT  8.5*  ALBUMIN 4.1   No results found for this basename: LIPASE:2,AMYLASE:2 in the last 72 hours  Basename 11/18/11 1916  WBC 7.9  NEUTROABS 5.4  HGB 13.3  HCT 38.5*  MCV 85.6  PLT 378   No results found for this basename: CKTOTAL:3,CKMB:3,CKMBINDEX:3,TROPONINI:3 in the last 72 hours No results found for this basename: TSH,T4TOTAL,FREET3,T3FREE,THYROIDAB in the last 72 hours No results found for this basename: VITAMINB12:2,FOLATE:2,FERRITIN:2,TIBC:2,IRON:2,RETICCTPCT:2 in the last 72 hours  Radiological Exams on Admission: Ct Abdomen Pelvis Wo Contrast  11/18/2011  *RADIOLOGY REPORT*  Clinical Data: Abdominal pain, vomiting, diarrhea.  Hypotension. History of bladder cancer.  Recent colostomy.  CT ABDOMEN AND PELVIS WITHOUT CONTRAST  Technique:  Multidetector CT imaging of the abdomen and pelvis was performed following the standard protocol without intravenous contrast.  Comparison: 09/09/2011; 10/07/2011  Findings: Subsegmental atelectasis noted in both lower lobes. Scattered small hepatic hypodensities probably represent cysts but are technically nonspecific on today's noncontrast CT scan.  The spleen and adrenal glands appear normal.  Bilateral nephrolithiasis is present, and there is a 4 mm calculus at the right UPJ without  obstruction.  There is fullness of the left proximal ureter, with poor visualization of the distal ureter.  The ileal conduit is indistinct, as is its attachment to the ostomy site.  Orally administered contrast extends through loops of small bowel and to the level of the rectum.  There are air-fluid levels and scattered pelvic loops of small bowel, which are borderline dilated.  The colon does not appear dilated.  No pathologic adenopathy is currently observed.  Prior cystoprostatectomy noted stable sclerotic marginated lesion in the left iliac bone noted, not significantly changed from 03/18/2011.  IMPRESSION:  1.  Bilateral nephrolithiasis.  There is an apparently  nonobstructive 4 mm right UPJ calculus. 2.  Scattered air-fluid levels and borderline dilated loops of small bowel.  However, orally administered contrast makes its way through to the rectum.  Query mild ileus. 3.  The distal ureters and their attachment to the ileal conduit are poorly seen, and better visualization would probably require delayed phase postcontrast imaging.  No definite hydroureter to suggest a urinary obstruction.  No abnormal fluid collection is observed around the ostomy site, and no peristomal hernia is noted. 4.  Hypodense hepatic lesions are similar to prior MRI and probably represent cysts.  Original Report Authenticated By: Dellia Cloud, M.D.    Assessment/Plan Present on Admission:   Acute Renal failure: most likely pre renal in view of one week of persistent nausea , vomiting and diarrhea. Will adequately hydrate him, repeat metabolic panel in am and call Dr Hyman Hopes. His potassium and bicarbonate levels are normal.  Will also get urine electrolytes.  Ct abdomen does not show hydronephrosis or hydro ureter. Urine analysis is abnormal with large leukocytes and many bacteria. Will start the patient on Levaquin and get urine culture.   Ileus: probably related to persistent nausea and vomiting. Will start him on clear liquid diet and repeat KUB  In am.   Nausea/ vomiting/ Diarrhea: differential could be secondary to viral gastroenteritis vs secondary to the indistinct ileal conduit.  Will evaluate for C diff diarrhea, and send stool work up. Symptomatic treatment for nausea and vomiting with fluids, and IV anti emetics.   Hypotension: resolved with hydration.   Prostate and Bladder cancer: please call urology in am as needed.  DVT Prophylaxis: Lovenox.   Full code  Time spent on this patient including examination and decision-making process: 61 minutes.  Manuel Lawhead [phone] 11/18/2011, 11:56 PM

## 2011-11-18 NOTE — ED Notes (Signed)
Patient transported to CT 

## 2011-11-19 ENCOUNTER — Other Ambulatory Visit (HOSPITAL_COMMUNITY): Payer: Medicare Other

## 2011-11-19 ENCOUNTER — Inpatient Hospital Stay (HOSPITAL_COMMUNITY): Payer: Medicare Other

## 2011-11-19 ENCOUNTER — Encounter (HOSPITAL_COMMUNITY): Payer: Self-pay | Admitting: *Deleted

## 2011-11-19 DIAGNOSIS — R112 Nausea with vomiting, unspecified: Secondary | ICD-10-CM | POA: Diagnosis present

## 2011-11-19 DIAGNOSIS — K567 Ileus, unspecified: Secondary | ICD-10-CM | POA: Diagnosis present

## 2011-11-19 DIAGNOSIS — N179 Acute kidney failure, unspecified: Secondary | ICD-10-CM | POA: Diagnosis present

## 2011-11-19 DIAGNOSIS — R197 Diarrhea, unspecified: Secondary | ICD-10-CM | POA: Diagnosis present

## 2011-11-19 LAB — CBC
HCT: 31.3 % — ABNORMAL LOW (ref 39.0–52.0)
MCHC: 33.9 g/dL (ref 30.0–36.0)
Platelets: 281 10*3/uL (ref 150–400)
RDW: 14 % (ref 11.5–15.5)

## 2011-11-19 LAB — COMPREHENSIVE METABOLIC PANEL
Albumin: 3.1 g/dL — ABNORMAL LOW (ref 3.5–5.2)
Alkaline Phosphatase: 48 U/L (ref 39–117)
BUN: 100 mg/dL — ABNORMAL HIGH (ref 6–23)
Potassium: 4.3 mEq/L (ref 3.5–5.1)
Sodium: 139 mEq/L (ref 135–145)
Total Protein: 6.7 g/dL (ref 6.0–8.3)

## 2011-11-19 LAB — BASIC METABOLIC PANEL
CO2: 20 mEq/L (ref 19–32)
Chloride: 106 mEq/L (ref 96–112)
Creatinine, Ser: 11.47 mg/dL — ABNORMAL HIGH (ref 0.50–1.35)

## 2011-11-19 LAB — TSH: TSH: 0.544 u[IU]/mL (ref 0.350–4.500)

## 2011-11-19 LAB — SODIUM, URINE, RANDOM: Sodium, Ur: 114 mEq/L

## 2011-11-19 LAB — CREATININE, URINE, RANDOM: Creatinine, Urine: 96.5 mg/dL

## 2011-11-19 MED ORDER — ENOXAPARIN SODIUM 30 MG/0.3ML ~~LOC~~ SOLN
30.0000 mg | SUBCUTANEOUS | Status: DC
  Administered 2011-11-19 – 2011-11-22 (×4): 30 mg via SUBCUTANEOUS
  Filled 2011-11-19 (×4): qty 0.3

## 2011-11-19 MED ORDER — ACETAMINOPHEN 650 MG RE SUPP: 650.0000 mg | Freq: Four times a day (QID) | RECTAL | Status: DC | PRN

## 2011-11-19 MED ORDER — ACETAMINOPHEN 325 MG PO TABS
650.0000 mg | ORAL_TABLET | Freq: Four times a day (QID) | ORAL | Status: DC | PRN
  Administered 2011-11-20: 650 mg via ORAL
  Filled 2011-11-19: qty 2

## 2011-11-19 MED ORDER — SODIUM CHLORIDE 0.9 % IV SOLN
Freq: Once | INTRAVENOUS | Status: AC
  Administered 2011-11-19: 02:00:00 via INTRAVENOUS

## 2011-11-19 MED ORDER — PANTOPRAZOLE SODIUM 40 MG PO TBEC
40.0000 mg | DELAYED_RELEASE_TABLET | Freq: Every day | ORAL | Status: DC
  Administered 2011-11-19 – 2011-11-22 (×4): 40 mg via ORAL
  Filled 2011-11-19 (×5): qty 1

## 2011-11-19 MED ORDER — SIMVASTATIN 40 MG PO TABS
40.0000 mg | ORAL_TABLET | Freq: Every day | ORAL | Status: DC
  Administered 2011-11-19 – 2011-11-20 (×2): 40 mg via ORAL
  Filled 2011-11-19 (×4): qty 1

## 2011-11-19 MED ORDER — LEVOFLOXACIN IN D5W 500 MG/100ML IV SOLN
500.0000 mg | Freq: Once | INTRAVENOUS | Status: AC
  Administered 2011-11-19: 500 mg via INTRAVENOUS
  Filled 2011-11-19: qty 100

## 2011-11-19 MED ORDER — BOOST / RESOURCE BREEZE PO LIQD
1.0000 | Freq: Three times a day (TID) | ORAL | Status: DC
  Administered 2011-11-19 – 2011-11-22 (×9): 1 via ORAL

## 2011-11-19 MED ORDER — ONDANSETRON HCL 4 MG/2ML IJ SOLN: 4.0000 mg | INTRAMUSCULAR | Status: AC | PRN

## 2011-11-19 MED ORDER — SODIUM CHLORIDE 0.9 % IV SOLN
INTRAVENOUS | Status: DC
  Administered 2011-11-19 (×2): via INTRAVENOUS

## 2011-11-19 MED ORDER — LEVOFLOXACIN IN D5W 250 MG/50ML IV SOLN
250.0000 mg | INTRAVENOUS | Status: DC
  Administered 2011-11-20: 250 mg via INTRAVENOUS
  Filled 2011-11-19: qty 50

## 2011-11-19 NOTE — Plan of Care (Signed)
Problem: Phase I Progression Outcomes Goal: Initial discharge plan identified Outcome: Completed/Met Date Met:  11/19/11 Pt plans to return home with [facility]

## 2011-11-19 NOTE — Progress Notes (Signed)
   CARE MANAGEMENT NOTE 11/19/2011  Patient:  [facility]   Account Number:  [id number]  Date Initiated:  11/19/2011  Documentation initiated by:  [facility]  Subjective/Objective Assessment:   ADMITTED W/DIARRHEA [id number] CA.     Action/Plan:   FROM HOME,ALONE.ACTIVE W/CARE [facility]OUTH-HHRN/PT.   Anticipated DC Date:  11/25/2011   Anticipated DC Plan:  ACUTE TO ACUTE TRANS         PAC Choice  Resumption Of Svcs/PTA Provider   Choice offered to / List presented to:             Status of service:  In process, will continue to follow Medicare Important Message given?   (If response is "NO", the following Medicare IM given date fields will be blank) Date Medicare IM given:   Date Additional Medicare IM given:    Discharge Disposition:  ACUTE TO ACUTE TRANS  Per UR Regulation:  Reviewed for med. necessity/level of care/duration of stay  If discussed at Long Length of Stay Meetings, dates discussed:    Comments:  11/19/11 Purvis Sidle RN,BSN NCM [phone] [facility] C#[phone] INFORMED OF ADMISSION, & TRANSFER TO [facility].

## 2011-11-19 NOTE — Consult Note (Signed)
[facility] CONSULT NOTE Resident Note    Please see below for attending addendum to resident note.   Date: 11/19/2011                  Patient Name:  William Collier  MRN: [id number]  DOB: Oct 24, 1950  Age / Sex: 61 y.o., male         PCP: Julieanne Manson, MD, MD                 Service Requesting Consult: [facility]                 Reason for Consult: Acute on chronic renal failure            History of Present Illness: Patient is a 61 y.o. male with a PMHx of bladder and prostate cancer s/p cystoprostectomy with ileal conduit and urostomy 10/06/2010 by Dr Brunilda Payor (Urology), CKD stage III (Creatinine baseline 1.4-1.7), HTN , CVA (2008), History of recurrent Nephrolithiasis who was admitted to [facility] on 11/18/2011 for evaluation of Nausea/Vomiting and diarrhea as well as mild abdominal pain. Patient was further found to be Hypotensive with SBP 80s and in Acute renal failure with a creatinine of 14.3 Patient reports that he has been experiencing nausea and vomiting since 2 weeks and diarrhea since one week. He was in a rehab facility after surgery since 10/15/11 . It started in the facility and continued at home. He reports that he has not been eating and drinking anything since one week but continued to take his medications. He denies any other sick contacts or recent travel. He reports abdominal pain since the surgery and hick up since 2 weeks. He is not able to give information how much urine output he had through the urostomy. Denies any chest pain, SOB,leg swelling.   A CT abdomen without contrast was performed which showed bilateral nephrolithiasis nonobstructive 4 mm right UPJ calculus, distal ureter and attachment to the ileal conduit are poorly seen. No definite Hydroureter suggest obstruction. Dilated loops of small bowel suggestive of mild ileus. UA showed large amount of Leucocyte and many bacteria as well as calcium oxalate crystals and patient was started on Levoquin.  Patient was hydrated with IV NS with around 4 liters since 11/18/11 at 7 pm.      Medications: Outpatient medications: Prescriptions prior to admission  Medication Sig Dispense Refill  . amLODipine (NORVASC) 10 MG tablet Take 10 mg by mouth every morning.       . cloNIDine (CATAPRES) 0.1 MG tablet Take 0.1 mg by mouth 2 (two) times daily.       Marland Kitchen doxazosin (CARDURA) 4 MG tablet Take 4 mg by mouth at bedtime.       Marland Kitchen lisinopril (PRINIVIL,ZESTRIL) 40 MG tablet Take 40 mg by mouth every morning.       Marland Kitchen omeprazole (PRILOSEC) 20 MG capsule Take 20 mg by mouth daily.      . pravastatin (PRAVACHOL) 40 MG tablet Take 40 mg by mouth at bedtime.         Current medications: Current Facility-Administered Medications  Medication Dose Route Frequency Provider Last Rate Last Dose  . 0.9 %  sodium chloride infusion   Intravenous Continuous Kathlen Mody, MD   100 mL/hr at 11/18/11 2124  . 0.9 %  sodium chloride infusion   Intravenous Once Kathlen Mody, MD 1,000 mL/hr at 11/19/11 0145    . acetaminophen (TYLENOL) tablet 650 mg  650 mg Oral Q6H PRN Edson Snowball  Blake Divine, MD       Or  . acetaminophen (TYLENOL) suppository 650 mg  650 mg Rectal Q6H PRN Kathlen Mody, MD      . enoxaparin (LOVENOX) injection 30 mg  30 mg Subcutaneous Q24H Kathlen Mody, MD   30 mg at 11/19/11 0922  . Levofloxacin (LEVAQUIN) IVPB 250 mg  250 mg Intravenous Q48H Lorenza Evangelist, PHARMD      . levofloxacin [facility]) IVPB 500 mg  500 mg Intravenous Once Lorenza Evangelist, PHARMD   500 mg at 11/19/11 0116  . morphine 4 MG/ML injection 4 mg  4 mg Intravenous Once Ward Givens, MD   4 mg at 11/18/11 1918  . ondansetron (ZOFRAN) injection 4 mg  4 mg Intravenous Q20 Min PRN Ward Givens, MD   4 mg at 11/19/11 0120  . ondansetron (ZOFRAN) injection 4 mg  4 mg Intravenous Q4H PRN Ward Givens, MD      . pantoprazole (PROTONIX) EC tablet 40 mg  40 mg Oral Q1200 Kathlen Mody, MD      . simvastatin (ZOCOR) tablet 40 mg  40 mg Oral q1800 Kathlen Mody, MD      . sodium chloride 0.9 % bolus 1,000 mL  1,000 mL Intravenous Once Ward Givens, MD   1,000 mL at 11/18/11 2023  . sodium chloride 0.9 % bolus 500 mL  500 mL Intravenous STAT Ward Givens, MD   500 mL at 11/18/11 1917  . DISCONTD: 0.9 %  sodium chloride infusion   Intravenous Continuous Ward Givens, MD 100 mL/hr at 11/19/11 1042        Allergies: Allergies  Allergen Reactions  . Penicillins Other (See Comments)    HAD WHEN WAS A CHILD   . Pregabalin Swelling      Past Medical History: Past Medical History  Diagnosis Date  . Hypertension 08-20-11  . Dysrhythmia 08-20-11    skip beat, hx. RBBB  . GERD (gastroesophageal reflux disease) 08-20-11    controls with diet, hx. colon polyps(benign)  . Cancer 08-20-11    bladder cancer  . Arthritis   . Stroke 08-20-11    '08-Lt. sided weakness with residual > lt. arm.  . Chronic kidney disease      Past Surgical History: Past Surgical History  Procedure Date  . Bladder surgery 08-20-11    07-01-11 bladder surgery, past hx. lithotripsy  . Shoulder arthrotomy 08-20-11    left shoulder with retained pins  . Knee arthroscopy 08-20-11    bil. knee scopes-torn ligament  . Cystoscopy w/ ureteral stent placement 08/21/2011    Procedure: CYSTOSCOPY WITH RETROGRADE PYELOGRAM/URETERAL STENT PLACEMENT;  Surgeon: Milford Cage, MD;  Location: [facility];  Service: Urology;  Laterality: Left;  . Cystoscopy with biopsy 08/21/2011    Procedure: CYSTOSCOPY WITH BIOPSY;  Surgeon: Milford Cage, MD;  Location: [facility];  Service: Urology;  Laterality: N/A;  . Transurethral resection of bladder tumor 08/21/2011    Procedure: TRANSURETHRAL RESECTION OF BLADDER TUMOR (TURBT);  Surgeon: Milford Cage, MD;  Location: [facility];  Service: Urology;  Laterality: N/A;  need PK gyrus  . Cystectomy 10/07/2011    Procedure: CYSTECTOMY COMPLETE;  Surgeon: Milford Cage, MD;  Location: [facility];  Service: Urology;  Laterality:  Bilateral;  Radical Cystoprostatectomy Bilateral Pelvic Lymph Node Dissection Formation Of Ileal Conduit   . Prostatectomy 10/07/2011    Procedure: PROSTATECTOMY;  Surgeon: Milford Cage, MD;  Location: [facility];  Service:  Urology;  Laterality: N/A;  . Lymph node dissection 10/07/2011    Procedure: LYMPH NODE DISSECTION;  Surgeon: Milford Cage, MD;  Location: [facility];  Service: Urology;  Laterality: Bilateral;     Family History: History reviewed. No pertinent family history.   Social History: History   Social History  . Marital Status: Single    Spouse Name: N/A    Number of Children: N/A  . Years of Education: N/A   Occupational History  . Not on file.   Social History Main Topics  . Smoking status: Former Smoker -- 1.0 packs/day for 40 years    Types: Cigarettes    Quit date: 10/28/2011  . Smokeless tobacco: Not on file  . Alcohol Use: No  . Drug Use: 3 per week    Special: Marijuana     ocassionally  . Sexually Active: Yes   Other Topics Concern  . Not on file   Social History Narrative  . No narrative on file     Review of Systems: As per HPI  Vital Signs: Blood pressure 102/68, pulse 74, temperature 98.3 F (36.8 C), temperature source Oral, resp. rate 18, height 5' 8.11" (1.73 m), weight 153 lb 7 oz (69.6 kg), SpO2 99.00%.  Physical Exam: General: Vital signs reviewed and noted. Well-developed, well-nourished, in no acute distress; alert, appropriate and cooperative throughout examination.  Head: Normocephalic, atraumatic.  Eyes: PERRL, EOMI, No signs of anemia or jaundince.  Nose: Mucous membranes moist, not inflammed, nonerythematous.  Throat: Oropharynx nonerythematous, no exudate appreciated.   Neck: No deformities, masses, or tenderness noted.Supple,   Lungs:  Normal respiratory effort. Clear to auscultation BL without crackles or wheezes.  Heart: RRR. S1 and S2 normal without gallop, murmur, or rubs.  Abdomen:  BS normoactive. Soft,  Nondistended, mildly tender to palpation.  Urostomy in place. No urine output noted  Extremities: No pretibial edema.  Neurologic: A&O X3, CN II - XII are grossly intact. Motor strength is 5/5 in the all 4 extremities, Sensations intact to light touch,    Lab results: Basic Metabolic Panel:  Lab 11/19/11 5573 11/18/11 1916  NA 139 136  K 4.3 4.3  CL 101 92*  CO2 19 23  GLUCOSE 98 131*  BUN 100* 108*  CREATININE 13.40* 14.43*  CALCIUM 8.6 10.2  MG -- --  PHOS -- --    Liver Function Tests:  Lab 11/19/11 0430 11/18/11 1916  AST 8 9  ALT 7 12  ALKPHOS 48 62  BILITOT 0.3 0.4  PROT 6.7 8.5*  ALBUMIN 3.1* 4.1   No results found for this basename: LIPASE:3,AMYLASE:3 in the last 168 hours No results found for this basename: AMMONIA:3 in the last 168 hours  CBC:  Lab 11/19/11 0430 11/18/11 1916  WBC 6.7 7.9  NEUTROABS -- 5.4  HGB 10.6* 13.3  HCT 31.3* 38.5*  MCV 87.2 85.6  PLT 281 378    Cardiac Enzymes: No results found for this basename: CKTOTAL:5,CKMB:5,CKMBINDEX:5,TROPONINI:5 in the last 168 hours  BNP: No components found with this basename: POCBNP:3  CBG: No results found for this basename: GLUCAP:5 in the last 168 hours  Microbiology: Results for orders placed during the hospital encounter of 10/07/11  WOUND CULTURE     Status: Normal   Collection Time   10/13/11  9:49 AM      Component Value Range Status Comment   Specimen Description WOUND   Final    Special Requests NONE   Final    Gram  Stain     Final    Value: NO WBC SEEN     NO SQUAMOUS EPITHELIAL CELLS SEEN     NO ORGANISMS SEEN   Culture     Final    Value: MODERATE GROUP B STREP(S.AGALACTIAE)ISOLATED     Note: TESTING AGAINST S. AGALACTIAE NOT ROUTINELY PERFORMED DUE TO PREDICTABILITY OF AMP/PEN/VAN SUSCEPTIBILITY.   Report Status 10/15/2011 FINAL   Final     Coagulation Studies: No results found for this basename: LABPROT:3,INR:3 in the last 72 hours  Urinalysis:  Basename 11/18/11  1929  COLORURINE RED*  LABSPEC 1.021  PHURINE 8.5*  GLUCOSEU NEGATIVE  HGBUR NEGATIVE  BILIRUBINUR SMALL*  KETONESUR 15*  PROTEINUR 100*  UROBILINOGEN 0.2  NITRITE NEGATIVE  LEUKOCYTESUR LARGE*      Imaging: Ct Abdomen Pelvis Wo Contrast  11/18/2011  *RADIOLOGY REPORT*  Clinical Data: Abdominal pain, vomiting, diarrhea.  Hypotension. History of bladder cancer.  Recent colostomy.  CT ABDOMEN AND PELVIS WITHOUT CONTRAST  Technique:  Multidetector CT imaging of the abdomen and pelvis was performed following the standard protocol without intravenous contrast.  Comparison: 09/09/2011; 10/07/2011  Findings: Subsegmental atelectasis noted in both lower lobes. Scattered small hepatic hypodensities probably represent cysts but are technically nonspecific on today's noncontrast CT scan.  The spleen and adrenal glands appear normal.  Bilateral nephrolithiasis is present, and there is a 4 mm calculus at the right UPJ without obstruction.  There is fullness of the left proximal ureter, with poor visualization of the distal ureter.  The ileal conduit is indistinct, as is its attachment to the ostomy site.  Orally administered contrast extends through loops of small bowel and to the level of the rectum.  There are air-fluid levels and scattered pelvic loops of small bowel, which are borderline dilated.  The colon does not appear dilated.  No pathologic adenopathy is currently observed.  Prior cystoprostatectomy noted stable sclerotic marginated lesion in the left iliac bone noted, not significantly changed from 03/18/2011.  IMPRESSION:  1.  Bilateral nephrolithiasis.  There is an apparently nonobstructive 4 mm right UPJ calculus. 2.  Scattered air-fluid levels and borderline dilated loops of small bowel.  However, orally administered contrast makes its way through to the rectum.  Query mild ileus. 3.  The distal ureters and their attachment to the ileal conduit are poorly seen, and better visualization would  probably require delayed phase postcontrast imaging.  No definite hydroureter to suggest a urinary obstruction.  No abnormal fluid collection is observed around the ostomy site, and no peristomal hernia is noted. 4.  Hypodense hepatic lesions are similar to prior MRI and probably represent cysts.  Original Report Authenticated By: Dellia Cloud, M.D.   Dg Abd 1 View  11/19/2011  *RADIOLOGY REPORT*  Clinical Data: Evaluate for ileus.  Nausea and hiccups.  History of colon surgery for cancer.  ABDOMEN - 1 VIEW  Comparison: CT abdomen pelvis 11/18/2011  Findings: Oral contrast from yesterday's CT scan has advanced into the colon.  Several diverticula associated with the descending colon are noted.  Gas is seen scattered throughout multiple loops of small bowel and colon.  Some of the small bowel loops are borderline prominent caliber and the left upper quadrant and central abdomen.  Gas is seen to the rectum.  No acute or suspicious bony abnormality.  IMPRESSION:  1.  No evidence of bowel obstruction.  Oral contrast has progressed into the colon from yesterday's CT. 2. Gas throughout the bowel loops, with borderline prominent small  bowel loops in the left upper quadrant and central abdomen. No significant change in bowel gas pattern compared to the abdomen pelvis CT of 11/18/2011.  The bowel gas pattern is nonspecific, but could be seen in the setting of gastroenteritis or mild ileus.  Original Report Authenticated By: Britta Mccreedy, M.D.      Assessment & Plan: Pt is a 61 y.o. yo male with a PMHX of bladder and prostate cancer s/p cystoprostectomy with ileal conduit and urostomy 10/06/2010 by Dr Brunilda Payor (Urology), CKD stage III (Creatinine baseline 1.4-1.7), HTN , CVA (2008), History of recurrent Nephrolithiasis who was admitted to [facility] on 11/18/2011 for evaluation of Nausea/Vomiting and diarrhea as well as mild abdominal pain. Patient was further found to be Hypotensive and in Acute renal failure with a  creatinine of 14.3.  1. Acute on chronic renal failure. Likely multifactorial including volume depletion in the setting of nausea/vomiting/diarrhea and ongoing ACEi use.  After discussion with Radiology obstruction due to ileal conduit occlusion or  Nephrolithiasis is unlikely.  Other DD include ATN due to significant volume depletion but also due to ischemia in the setting of Hypotension.  With FeNa of 11 % underlines that patient may already has ATN at this point.    - Continue NS IV fluid at a rate of 100 cc/hr - Strickt I&Os - Bmet bid - Will check renal panel in the morning.     2. Nausea/Vomiting: Likely due to mild ileus as noted on the CT abdomen (3/18). Other DD include gastroenteritis. Symptoms improving at this point.   -refer to Primary care team  3. Hypotension: Mildly improving with IV fluids. Hold all antihypertensive   4. Metabolic acidosis with an anion gab of 22 (adjusted for Albumin).   Possible due to starvation and acute renal failure.  Improving at this point with fluids. - Continue to hydration with NS - Continue to monitor  - May consider to ad bicarb  5. Anemia: Hgb baseline around 13 prior to Feb 2013. After cystoprostectomy  in 10/07/11 Hgb down to 9.2. On admission Hgb 13 most likely falsely normal due to volume depletion now at baseline. Patient may have anemia of chronic disease in the setting of CKD - continue to monitor  6. UTI: Awaiting urine culture. Currently on Levaquin.   7. Hiccups. Unclear etiology.  Consider Thorazine.    Patient history and plan of care reviewed with attending, Dr. Zetta Bills.   Almyra Deforest, MD PGYII, Internal Medicine Resident 11/19/2011, 1:42 PM  I have seen and examined this patient and agree with the assessment/plan as outlined above by Loistine Chance MD (PGY2). Based on the history, timeline of events and available database- this ARF appears to be consistent with volume depletion likely evolved into mild ATN. Would continue IV  fluids as you are doing and await Renal U/S to R/O obstruction. No acute HD needs noted and clinically appears to be doing better following IVFs.  Simon Llamas K.,MD 11/19/2011 3:45 PM

## 2011-11-19 NOTE — Progress Notes (Signed)
ANTIBIOTIC CONSULT NOTE - INITIAL  Pharmacy Consult for Levaquin Indication: UTI (pt in acute renal failure)  Allergies  Allergen Reactions  . Penicillins Other (See Comments)    HAD WHEN WAS A CHILD   . Pregabalin Swelling    Patient Measurements: Height: 5' 8.11" (173 cm) Weight: 176 lb 2.4 oz (79.9 kg) IBW/kg (Calculated) : 68.65    Vital Signs: Temp: 98 F (36.7 C) (03/18 2342) Temp src: Oral (03/18 2342) BP: 102/65 mmHg (03/18 2342) Pulse Rate: 82  (03/18 2342) Intake/Output from previous day:   Intake/Output from this shift:    Labs:  [facility] 11/18/11 1916  WBC 7.9  HGB 13.3  PLT 378  LABCREA --  CREATININE 14.43*   Estimated Creatinine Clearance: 5.2 ml/min (by C-G formula based on Cr of 14.43). No results found for this basename: VANCOTROUGH:2,VANCOPEAK:2,VANCORANDOM:2,GENTTROUGH:2,GENTPEAK:2,GENTRANDOM:2,TOBRATROUGH:2,TOBRAPEAK:2,TOBRARND:2,AMIKACINPEAK:2,AMIKACINTROU:2,AMIKACIN:2, in the last 72 hours   Microbiology: No results found for this or any previous visit (from the past 720 hour(s)).  Medical History: Past Medical History  Diagnosis Date  . Hypertension 08-20-11  . Dysrhythmia 08-20-11    skip beat, hx. RBBB  . GERD (gastroesophageal reflux disease) 08-20-11    controls with diet, hx. colon polyps(benign)  . Cancer 08-20-11    bladder cancer  . Arthritis   . Stroke 08-20-11    '08-Lt. sided weakness with residual > lt. arm.  . Chronic kidney disease     Medications:  Scheduled:    .  morphine injection  4 mg Intravenous Once  . sodium chloride  1,000 mL Intravenous Once  . sodium chloride  500 mL Intravenous STAT   Infusions:    . sodium chloride 100 mL/hr (11/18/11 2124)   Assessment: 61 yo male admitted in acute renal failure- MD treating for UTI.  Goal of Therapy:  Treat infection  Plan:   Levaquin 500mg  IV x1 then 250mg  IV q48h.    F/U SCr and increase dose as renal function improves.  Susanne Greenhouse  R 11/19/2011,12:42 AM

## 2011-11-19 NOTE — Progress Notes (Signed)
Utilization review completed.  

## 2011-11-19 NOTE — Progress Notes (Signed)
Subjective: "I feel a little better". Denies any "new" pain. Complained hiccups for 2 weeks. Denies nausea  Objective: Vital signs Filed Vitals:   11/19/11 0215 11/19/11 0230 11/19/11 0302 11/19/11 0642  BP: 89/68 92/64 93/66  95/63  Pulse: 76 76 94 87  Temp:   98 F (36.7 C) 98.1 F (36.7 C)  TempSrc:   Oral Oral  Resp: 18 17 18 18   Height:   5' 8.11" (1.73 m)   Weight:   69.6 kg (153 lb 7 oz)   SpO2: 100% 100% 98% 99%   Weight change:  Last BM Date: 11/19/11  Intake/Output from previous day: 03/18 0701 - 03/19 0700 In: 2996.4 [I.V.:2894.4; IV Piggyback:102] Out: 50 [Stool:50]     Physical Exam: General: Alert, awake, oriented x3, in no acute distress.  HEENT: No bruits, no goiter. Mucus membranes slightly dry/pink. PERRL Heart: Regular rate and rhythm, without murmurs, rubs, gallops. No LEE Lungs:Normal effort. Scattered rhonchi with faint expiratory wheeze . Abdomen: Soft, nontender, nondistended, positive bowel sounds but sluggish. Urostomy with pink stoma draining clear straw colored urine. Frequent hiccups. Extremities: No clubbing cyanosis or edema with positive pedal pulses. Neuro: Grossly intact, nonfocal. Speech clear. Residual     Lab Results: Basic Metabolic Panel:  Basename 11/19/11 0430 11/18/11 1916  NA 139 136  K 4.3 4.3  CL 101 92*  CO2 19 23  GLUCOSE 98 131*  BUN 100* 108*  CREATININE 13.40* 14.43*  CALCIUM 8.6 10.2  MG -- --  PHOS -- --   Liver Function Tests:  Basename 11/19/11 0430 11/18/11 1916  AST 8 9  ALT 7 12  ALKPHOS 48 62  BILITOT 0.3 0.4  PROT 6.7 8.5*  ALBUMIN 3.1* 4.1   No results found for this basename: LIPASE:2,AMYLASE:2 in the last 72 hours No results found for this basename: AMMONIA:2 in the last 72 hours CBC:  Basename 11/19/11 0430 11/18/11 1916  WBC 6.7 7.9  NEUTROABS -- 5.4  HGB 10.6* 13.3  HCT 31.3* 38.5*  MCV 87.2 85.6  PLT 281 378   Cardiac Enzymes: No results found for this basename:  CKTOTAL:3,CKMB:3,CKMBINDEX:3,TROPONINI:3 in the last 72 hours BNP: No results found for this basename: PROBNP:3 in the last 72 hours D-Dimer: No results found for this basename: DDIMER:2 in the last 72 hours CBG: No results found for this basename: GLUCAP:6 in the last 72 hours Hemoglobin A1C: No results found for this basename: HGBA1C in the last 72 hours Fasting Lipid Panel: No results found for this basename: CHOL,HDL,LDLCALC,TRIG,CHOLHDL,LDLDIRECT in the last 72 hours Thyroid Function Tests: No results found for this basename: TSH,T4TOTAL,FREET4,T3FREE,THYROIDAB in the last 72 hours Anemia Panel: No results found for this basename: VITAMINB12,FOLATE,FERRITIN,TIBC,IRON,RETICCTPCT in the last 72 hours Coagulation: No results found for this basename: LABPROT:2,INR:2 in the last 72 hours Urine Drug Screen: Drugs of Abuse  No results found for this basename: labopia, cocainscrnur, labbenz, amphetmu, thcu, labbarb    Alcohol Level: No results found for this basename: ETH:2 in the last 72 hours Urinalysis:  Basename 11/18/11 1929  COLORURINE RED*  LABSPEC 1.021  PHURINE 8.5*  GLUCOSEU NEGATIVE  HGBUR NEGATIVE  BILIRUBINUR SMALL*  KETONESUR 15*  PROTEINUR 100*  UROBILINOGEN 0.2  NITRITE NEGATIVE  LEUKOCYTESUR LARGE*   Misc. Labs:  No results found for this or any previous visit (from the past 240 hour(s)).  Studies/Results: Ct Abdomen Pelvis Wo Contrast  11/18/2011  *RADIOLOGY REPORT*  Clinical Data: Abdominal pain, vomiting, diarrhea.  Hypotension. History of bladder cancer.  Recent colostomy.  CT ABDOMEN AND PELVIS WITHOUT CONTRAST  Technique:  Multidetector CT imaging of the abdomen and pelvis was performed following the standard protocol without intravenous contrast.  Comparison: 09/09/2011; 10/07/2011  Findings: Subsegmental atelectasis noted in both lower lobes. Scattered small hepatic hypodensities probably represent cysts but are technically nonspecific on today's  noncontrast CT scan.  The spleen and adrenal glands appear normal.  Bilateral nephrolithiasis is present, and there is a 4 mm calculus at the right UPJ without obstruction.  There is fullness of the left proximal ureter, with poor visualization of the distal ureter.  The ileal conduit is indistinct, as is its attachment to the ostomy site.  Orally administered contrast extends through loops of small bowel and to the level of the rectum.  There are air-fluid levels and scattered pelvic loops of small bowel, which are borderline dilated.  The colon does not appear dilated.  No pathologic adenopathy is currently observed.  Prior cystoprostatectomy noted stable sclerotic marginated lesion in the left iliac bone noted, not significantly changed from 03/18/2011.  IMPRESSION:  1.  Bilateral nephrolithiasis.  There is an apparently nonobstructive 4 mm right UPJ calculus. 2.  Scattered air-fluid levels and borderline dilated loops of small bowel.  However, orally administered contrast makes its way through to the rectum.  Query mild ileus. 3.  The distal ureters and their attachment to the ileal conduit are poorly seen, and better visualization would probably require delayed phase postcontrast imaging.  No definite hydroureter to suggest a urinary obstruction.  No abnormal fluid collection is observed around the ostomy site, and no peristomal hernia is noted. 4.  Hypodense hepatic lesions are similar to prior MRI and probably represent cysts.  Original Report Authenticated By: Dellia Cloud, M.D.    Medications: Scheduled Meds:   . sodium chloride   Intravenous Once  . enoxaparin  30 mg Subcutaneous Q24H  . levofloxacin (LEVAQUIN) IV  250 mg Intravenous Q48H  . levofloxacin (LEVAQUIN) IV  500 mg Intravenous Once  .  morphine injection  4 mg Intravenous Once  . pantoprazole  40 mg Oral Q1200  . simvastatin  40 mg Oral q1800  . sodium chloride  1,000 mL Intravenous Once  . sodium chloride  500 mL  Intravenous STAT   Continuous Infusions:   . sodium chloride 100 mL/hr (11/18/11 2124)  . sodium chloride 100 mL/hr at 11/19/11 0334   PRN Meds:.acetaminophen, acetaminophen, ondansetron (ZOFRAN) IV, ondansetron (ZOFRAN) IV  Assessment/Plan:  Principal Problem:  *Acute renal failure Active Problems:  Diarrhea  Nausea and vomiting  Ileus   Acute Renal failure: most likely pre renal in view of one week of persistent nausea , vomiting and diarrhea as well as hypotension. Has improved slightly with IV fluids. Urine output about 100.  His potassium and bicarbonate levels remain normal.  Urine creatinine 96.5. Urine sodium pending. Ct abdomen does not show hydronephrosis or hydro ureter. Spoke with Dr Hyman Hopes and pt being transferred to [facility] on Hospitalist service.  UTI: Urine analysis is abnormal with large leukocytes and many bacteria. Continue Levaquin day #2. Urine culture pending  Ileus: probably related to persistent nausea and vomiting. Continue clear liquid diet. Repeat KUB pending   Nausea/ vomiting/ Diarrhea: differential could be secondary to viral gastroenteritis vs secondary to the indistinct ileal conduit. C diff diarrhea not sent yet as pt has had no diarrhea. Symptomatic treatment for nausea and vomiting with fluids, and IV anti emetics.  Hypotension: Improved with hydration. SBP range 80-95. Continue to monitor Prostate  and Bladder cancer: please call urology in am as needed.  DVT Prophylaxis: Lovenox.  Full code      LOS: 1 day   [facility] M 11/19/2011, 8:10 AM

## 2011-11-19 NOTE — Progress Notes (Signed)
11/19/2011 patient transfer from [facility] to [facility] around 1200. Patient is alert and oriented, ambulate with a cane and uses a brace on left  Foot. Patient have a urostomy on the right lower quadrant, connected to a foley bag and urine is clear. Patient have history of a stroke and he is weak on the left side. Patient left arm grip is weak, he is able to list is left leg. Patient is on contact to r/o c-diff Biochemist, clinical.

## 2011-11-19 NOTE — Progress Notes (Signed)
INITIAL ADULT NUTRITION ASSESSMENT Date: 11/19/2011   Time: 3:58 PM  Reason for Assessment: Nutrition Risk Report  ASSESSMENT: Male 61 y.o.  Dx: Acute renal failure  Hx:  Past Medical History  Diagnosis Date  . Hypertension 08-20-11  . Dysrhythmia 08-20-11    skip beat, hx. RBBB  . GERD (gastroesophageal reflux disease) 08-20-11    controls with diet, hx. colon polyps(benign)  . Cancer 08-20-11    bladder cancer  . Arthritis   . Stroke 08-20-11    '08-Lt. sided weakness with residual > lt. arm.  . Chronic kidney disease     CKD stage III   Related Meds:     . sodium chloride   Intravenous Once  . enoxaparin  30 mg Subcutaneous Q24H  . levofloxacin (LEVAQUIN) IV  250 mg Intravenous Q48H  . levofloxacin (LEVAQUIN) IV  500 mg Intravenous Once  .  morphine injection  4 mg Intravenous Once  . pantoprazole  40 mg Oral Q1200  . simvastatin  40 mg Oral q1800  . sodium chloride  1,000 mL Intravenous Once  . sodium chloride  500 mL Intravenous STAT   Ht: 5' 8.11" (173 cm)  Wt: 153 lb 7 oz (69.6 kg) (actual weight )  Ideal Wt: 70 kg % Ideal Wt: 99%  Wt Readings from Last 5 Encounters:  11/19/11 153 lb 7 oz (69.6 kg)  10/09/11 176 lb 2.4 oz (79.9 kg)  10/09/11 176 lb 2.4 oz (79.9 kg)  10/04/11 174 lb 8 oz (79.153 kg)  08/20/11 175 lb 5 oz (79.521 kg)  Usual Wt: 175 lb, per pt in February 2011 % Usual Wt: 87%  Body mass index is 23.26 kg/(m^2). Weight is WNL  Food/Nutrition Related Hx: poor appetite x weeks  Labs:  CMP     Component Value Date/Time   NA 139 11/19/2011 0430   K 4.3 11/19/2011 0430   CL 101 11/19/2011 0430   CO2 19 11/19/2011 0430   GLUCOSE 98 11/19/2011 0430   BUN 100* 11/19/2011 0430   CREATININE 13.40* 11/19/2011 0430   CALCIUM 8.6 11/19/2011 0430   PROT 6.7 11/19/2011 0430   ALBUMIN 3.1* 11/19/2011 0430   AST 8 11/19/2011 0430   ALT 7 11/19/2011 0430   ALKPHOS 48 11/19/2011 0430   BILITOT 0.3 11/19/2011 0430   GFRNONAA 3* 11/19/2011 0430   GFRAA 4*  11/19/2011 0430   Intake/Output: I/O last 3 completed shifts: In: 2996.4 [I.V.:2894.4; IV Piggyback:102] Out: 50 [Stool:50] Total I/O In: 690 [P.O.:240; I.V.:450] Out: -   Diet Order: Clear Liquid  Supplements/Tube Feeding: none  IVF:    sodium chloride Last Rate: Stopped (11/19/11 1249)  DISCONTD: sodium chloride Last Rate: 100 mL/hr at 11/19/11 1042   Estimated Nutritional Needs:   Kcal:  1620 - 1800 kcal Protein:  56 - 66 grams protein Fluid:  1.7 - 1.9 L/d  Pt presenting with n/v/d x 1 week. Notable hx of bladder and prostate CA s/p prostatectomy.  Pt states he has not been eating well since his February hospitalization. Pt weighed approximately 175 lb at that time. Currently down to 153 lb. This is a weight change of 13% x 1 month. Pt states he has not had anything "solid" to eat for 1 week; friends/family at bedside confirm this. Pt meets criteria for severe malnutrition in the context of acute illness 2/2 <50% of estimated energy intake for at least 5 days and 13% wt loss of usual body weight x 1 month. Pt is at  risk for refeeding syndrome given prolonged suboptimal PO intake.  Has not tried Ensure/Boost products. Agreeable to [employer].  NUTRITION DIAGNOSIS: -Inadequate oral intake (NI-2.1).  Status: Ongoing  RELATED TO: GI distress  AS EVIDENCE BY: pt report of poor PO intake and unintentional wt loss  MONITORING/EVALUATION(Goals): Goal: Pt to consume >/= 75% of meals and supplements. Monitor: PO intake, weight, labs, I/O's  EDUCATION NEEDS: -No education needs identified at this time  INTERVENTION: 1. Resource Breeze PO TID for additional protein and kcal 2. Monitor potassium, magnesium, and phosphorus; replete prn 3. RD to follow nutrition care plan  Dietitian #: [phone]  DOCUMENTATION CODES Per approved criteria  -Severe malnutrition in the context of acute illness or injury    Adair Laundry 11/19/2011, 3:58 PM

## 2011-11-20 LAB — URINE CULTURE
Colony Count: >=100000
Culture  Setup Time: 201303190343

## 2011-11-20 LAB — RENAL FUNCTION PANEL
BUN: 83 mg/dL — ABNORMAL HIGH (ref 6–23)
CO2: 18 mEq/L — ABNORMAL LOW (ref 19–32)
Chloride: 112 mEq/L (ref 96–112)
Creatinine, Ser: 9.01 mg/dL — ABNORMAL HIGH (ref 0.50–1.35)
Glucose, Bld: 85 mg/dL (ref 70–99)

## 2011-11-20 LAB — CBC
HCT: 28 % — ABNORMAL LOW (ref 39.0–52.0)
MCV: 85.4 fL (ref 78.0–100.0)
RBC: 3.28 MIL/uL — ABNORMAL LOW (ref 4.22–5.81)
RDW: 13.8 % (ref 11.5–15.5)
WBC: 5.5 10*3/uL (ref 4.0–10.5)

## 2011-11-20 MED ORDER — SODIUM CHLORIDE 0.45 % IV SOLN
INTRAVENOUS | Status: AC
  Administered 2011-11-20 – 2011-11-21 (×2): via INTRAVENOUS
  Filled 2011-11-20 (×2): qty 1000

## 2011-11-20 MED ORDER — SODIUM BICARBONATE 8.4 % IV SOLN
INTRAVENOUS | Status: DC
  Filled 2011-11-20: qty 2000

## 2011-11-20 MED ORDER — ONDANSETRON HCL 4 MG/2ML IJ SOLN
4.0000 mg | Freq: Four times a day (QID) | INTRAMUSCULAR | Status: DC | PRN
  Administered 2011-11-20: 4 mg via INTRAVENOUS
  Filled 2011-11-20: qty 2

## 2011-11-20 MED ORDER — CHLORPROMAZINE HCL 25 MG/ML IJ SOLN
25.0000 mg | Freq: Once | INTRAMUSCULAR | Status: AC
  Administered 2011-11-20: 25 mg via INTRAMUSCULAR
  Filled 2011-11-20: qty 1

## 2011-11-20 NOTE — Progress Notes (Signed)
Subjective: Tolerating PO, no fevers/chills, diarrhea resolved Feeling much better   Objective: Vital signs in last 24 hours: Filed Vitals:   11/19/11 1819 11/19/11 2104 11/20/11 0510 11/20/11 1000  BP: 103/68 99/72 115/76 139/86  Pulse: 75 85 84 85  Temp: 98.1 F (36.7 C) 98.4 F (36.9 C) 97.9 F (36.6 C) 98.6 F (37 C)  TempSrc: Oral Oral Oral Oral  Resp: 18 19 19 20   Height:      Weight:      SpO2: 99% 100% 100% 100%   Weight change:   Intake/Output Summary (Last 24 hours) at 11/20/11 1328 Last data filed at 11/20/11 0900  Gross per 24 hour  Intake    260 ml  Output   1500 ml  Net  -1240 ml    Physical Exam: General: Awake, Oriented, No acute distress. HEENT: EOMI. Neck: Supple CV: S1 and S2, rrr Lungs: Clear to ascultation bilaterally, no wheezes Abdomen: Soft, Nontender, Nondistended, +bowel sounds. Ext: Good pulses. Trace edema.  Lab Results:  Basename 11/20/11 0520 11/19/11 1551  NA 142 140  K 4.2 4.3  CL 112 106  CO2 18* 20  GLUCOSE 85 107*  BUN 83* 98*  CREATININE 9.01* 11.47*  CALCIUM 8.5 8.6  MG -- --  PHOS 4.5 --    Basename 11/20/11 0520 11/19/11 0430 11/18/11 1916  AST -- 8 9  ALT -- 7 12  ALKPHOS -- 48 62  BILITOT -- 0.3 0.4  PROT -- 6.7 8.5*  ALBUMIN 2.7* 3.1* --   No results found for this basename: LIPASE:2,AMYLASE:2 in the last 72 hours  Basename 11/20/11 0520 11/19/11 0430 11/18/11 1916  WBC 5.5 6.7 --  NEUTROABS -- -- 5.4  HGB 9.4* 10.6* --  HCT 28.0* 31.3* --  MCV 85.4 87.2 --  PLT 242 281 --   No results found for this basename: CKTOTAL:3,CKMB:3,CKMBINDEX:3,TROPONINI:3 in the last 72 hours No components found with this basename: POCBNP:3 No results found for this basename: DDIMER:2 in the last 72 hours No results found for this basename: HGBA1C:2 in the last 72 hours No results found for this basename: CHOL:2,HDL:2,LDLCALC:2,TRIG:2,CHOLHDL:2,LDLDIRECT:2 in the last 72 hours  Basename 11/19/11 0430  TSH 0.544    T4TOTAL --  T3FREE --  THYROIDAB --   No results found for this basename: VITAMINB12:2,FOLATE:2,FERRITIN:2,TIBC:2,IRON:2,RETICCTPCT:2 in the last 72 hours  Micro Results: Recent Results (from the past 240 hour(s))  URINE CULTURE     Status: Normal   Collection Time   11/19/11  1:04 AM      Component Value Range Status Comment   Specimen Description URINE, SUPRAPUBIC   Final    Special Requests NONE   Final    Culture  Setup Time [id number]   Final    Colony Count >=100,000 COLONIES/ML   Final    Culture ENTEROBACTER AEROGENES   Final    Report Status 11/20/2011 FINAL   Final    Organism ID, Bacteria ENTEROBACTER AEROGENES   Final     Studies/Results: Ct Abdomen Pelvis Wo Contrast  11/18/2011  *RADIOLOGY REPORT*  Clinical Data: Abdominal pain, vomiting, diarrhea.  Hypotension. History of bladder cancer.  Recent colostomy.  CT ABDOMEN AND PELVIS WITHOUT CONTRAST  Technique:  Multidetector CT imaging of the abdomen and pelvis was performed following the standard protocol without intravenous contrast.  Comparison: 09/09/2011; 10/07/2011  Findings: Subsegmental atelectasis noted in both lower lobes. Scattered small hepatic hypodensities probably represent cysts but are technically nonspecific on today's noncontrast CT scan.  The spleen  and adrenal glands appear normal.  Bilateral nephrolithiasis is present, and there is a 4 mm calculus at the right UPJ without obstruction.  There is fullness of the left proximal ureter, with poor visualization of the distal ureter.  The ileal conduit is indistinct, as is its attachment to the ostomy site.  Orally administered contrast extends through loops of small bowel and to the level of the rectum.  There are air-fluid levels and scattered pelvic loops of small bowel, which are borderline dilated.  The colon does not appear dilated.  No pathologic adenopathy is currently observed.  Prior cystoprostatectomy noted stable sclerotic marginated lesion in the left  iliac bone noted, not significantly changed from 03/18/2011.  IMPRESSION:  1.  Bilateral nephrolithiasis.  There is an apparently nonobstructive 4 mm right UPJ calculus. 2.  Scattered air-fluid levels and borderline dilated loops of small bowel.  However, orally administered contrast makes its way through to the rectum.  Query mild ileus. 3.  The distal ureters and their attachment to the ileal conduit are poorly seen, and better visualization would probably require delayed phase postcontrast imaging.  No definite hydroureter to suggest a urinary obstruction.  No abnormal fluid collection is observed around the ostomy site, and no peristomal hernia is noted. 4.  Hypodense hepatic lesions are similar to prior MRI and probably represent cysts.  Original Report Authenticated By: Dellia Cloud, M.D.   Dg Abd 1 View  11/19/2011  *RADIOLOGY REPORT*  Clinical Data: Evaluate for ileus.  Nausea and hiccups.  History of colon surgery for cancer.  ABDOMEN - 1 VIEW  Comparison: CT abdomen pelvis 11/18/2011  Findings: Oral contrast from yesterday's CT scan has advanced into the colon.  Several diverticula associated with the descending colon are noted.  Gas is seen scattered throughout multiple loops of small bowel and colon.  Some of the small bowel loops are borderline prominent caliber and the left upper quadrant and central abdomen.  Gas is seen to the rectum.  No acute or suspicious bony abnormality.  IMPRESSION:  1.  No evidence of bowel obstruction.  Oral contrast has progressed into the colon from yesterday's CT. 2. Gas throughout the bowel loops, with borderline prominent small bowel loops in the left upper quadrant and central abdomen. No significant change in bowel gas pattern compared to the abdomen pelvis CT of 11/18/2011.  The bowel gas pattern is nonspecific, but could be seen in the setting of gastroenteritis or mild ileus.  Original Report Authenticated By: Britta Mccreedy, M.D.   US Renal  11/19/2011   *RADIOLOGY REPORT*  Clinical Data:  Acute renal failure  RENAL/URINARY TRACT ULTRASOUND COMPLETE  Comparison:  11/18/2011  Findings:  Right Kidney:  The right kidney is diffusely echogenic measuring 12.1 cm. Normal in size.  No mass or hydronephrosis.  Left Kidney:  The left kidney measures 11 cm.  The left kidney is diffusely echogenic.Normal in size and parenchymal echogenicity. No evidence of mass or hydronephrosis. Stone is identified within the inferior pole of the left kidney measuring approximately 6 mm.  Bladder:  Appears normal for degree of bladder distention.  IMPRESSION:  1.  Bilateral echogenic kidneys compatible with medical renal disease. 2.  No evidence for obstructive uropathy.  Original Report Authenticated By: Rosealee Albee, M.D.    Medications: I have reviewed the patient's current medications. Scheduled Meds:   . chlorproMAZINE  25 mg Intramuscular Once  . enoxaparin  30 mg Subcutaneous Q24H  . feeding supplement  1 Container Oral  TID WC  . levofloxacin (LEVAQUIN) IV  250 mg Intravenous Q48H  . pantoprazole  40 mg Oral Q1200  . simvastatin  40 mg Oral q1800   Continuous Infusions:   . sodium chloride 0.45 % 1,000 mL with sodium bicarbonate 50 mEq infusion 100 mL/hr at 11/20/11 1301  . DISCONTD: sodium chloride 150 mL/hr at 11/20/11 0245  . DISCONTD: sodium chloride 0.45 % 2,000 mL with sodium bicarbonate 50 mEq infusion     PRN Meds:.acetaminophen, acetaminophen, ondansetron (ZOFRAN) IV, ondansetron (ZOFRAN) IV, DISCONTD: ondansetron (ZOFRAN) IV  Assessment/Plan: Pt is a 61 y.o. yo male with a PMHX of bladder and prostate cancer s/p cystoprostectomy with ileal conduit and urostomy, CKD stage III , HTN , CVA (2008), history of recurrent Nephrolithiasis who was admitted to [facility] on 11/18/2011 for evaluation of Nausea/Vomiting and diarrhea as well as mild abdominal pain. Renal service is consulted for acute on chronic renal failure.   1. Acute on chronic renal failure.  Likely multifactorial including volume depletion in the setting of nausea/vomiting/diarrhea and ongoing ACEi use. Improving renal function with IVF from 14 >9 today  - Continue IVF  - Strickt I&Os  Nephrology consulted  2. Nausea/Vomiting: Symptoms improved, ? ileus.    3. Hypotension: Improving with IV fluids. Hold all antihypertensive   4. Metabolic acidosis with anion RUE:AVWUJW due to starvation and acute renal failure. Improving at this point with fluids.  - Continue to hydration - with change in IVF - Continue to monitor   5. Anemia: Hgb baseline around 13 prior to Feb 2013. After cystoprostectomy in 10/07/11 Hgb down to 9.2. On admission Hgb 13 most likely falsely normal due to volume depletion now at baseline. Patient may have anemia of chronic disease in the setting of CKD and cancer   6. UTI/enterobacter: Urine culture is noted for gram neg. Rods with >100.000 colonies. Currently on Levaquin. Day #3  7. Diarrhea: resolved.  8. nonobstructive 4 mm right UPJ calculus    LOS: 2 days  Delane Wessinger, DO 11/20/2011, 1:28 PM

## 2011-11-20 NOTE — Progress Notes (Signed)
   CARE MANAGEMENT NOTE 11/20/2011  Patient:  [facility]   Account Number:  [id number]  Date Initiated:  11/19/2011  Documentation initiated by:  [facility]  Subjective/Objective Assessment:   ADMITTED W/DIARRHEA [id number] CA.     Action/Plan:   FROM HOME,ALONE.ACTIVE W/CARE SOUTH-HHRN/PT.   Anticipated DC Date:  11/25/2011   Anticipated DC Plan:  ACUTE TO ACUTE TRANS         PAC Choice  Resumption Of Svcs/PTA Provider   Choice offered to / List presented to:             Status of service:  In process, will continue to follow Medicare Important Message given?   (If response is "NO", the following Medicare IM given date fields will be blank) Date Medicare IM given:   Date Additional Medicare IM given:    Discharge Disposition:  ACUTE TO ACUTE TRANS  Per UR Regulation:  Reviewed for med. necessity/level of care/duration of stay  If discussed at Long Length of Stay Meetings, dates discussed:    Comments:  11/20/11 17:02 Letha Cape RN, BSN [phone] patient lives alone, patient has medication coverage and transportation.  PTA independent.  11/19/11 KATHY MAHABIR RN,BSN NCM [phone] [facility] C#[phone] INFORMED OF ADMISSION, & TRANSFER TO [facility].

## 2011-11-20 NOTE — Progress Notes (Signed)
[facility] PRO[facility]ESS NOTE Resident Note   Please see below for attending addendum to resident note.  Subjective:    Reports he is doing a lot better. Continues to have hiccups but no vomiting and diarrhea. Mentioned some nausea controlled with Zofran . Tolerating diet.    Objective:    Vital Signs:   Temp:  [97.9 F (36.6 C)-98.4 F (36.9 C)] 97.9 F (36.6 C) (03/20 0510) Pulse Rate:  [74-85] 84  (03/20 0510) Resp:  [18-19] 19  (03/20 0510) BP: (99-115)/(68-76) 115/76 mmHg (03/20 0510) SpO2:  [99 %-100 %] 100 % (03/20 0510) Last BM Date: 11/19/11  24-hour weight change: Weight change:   Weight trends: Filed Weights   11/19/11 0040 11/19/11 0302  Weight: 176 lb 2.4 oz (79.9 kg) 153 lb 7 oz (69.6 kg)    Intake/Output:  03/19 0701 - 03/20 0700 In: 930 [P.O.:480; I.V.:450] Out: 1500 [Urine:1500]  Physical Exam: General: Vital signs reviewed and noted. Well-developed, well-nourished, in no acute distress; alert, appropriate and cooperative throughout examination.  Lungs:  Normal respiratory effort. Clear to auscultation BL without crackles or wheezes.  Heart: RRR. S1 and S2 .  Abdomen:  BS normoactive. Soft, Nondistended, non-tender.  No masses or organomegaly.  Extremities: No pretibial edema.     Labs: Basic Metabolic Panel:  Lab 11/20/11 0981 11/19/11 1551 11/19/11 0430 11/18/11 1916  NA 142 140 139 136  K 4.2 4.3 4.3 4.3  CL 112 106 101 92*  CO2 18* 20 19 23   GLUCOSE 85 107* 98 131*  BUN 83* 98* 100* 108*  CREATININE 9.01* 11.47* 13.40* 14.43*  CALCIUM 8.5 8.6 8.6 --  MG -- -- -- --  PHOS 4.5 -- -- --    Liver Function Tests:  Lab 11/20/11 0520 11/19/11 0430 11/18/11 1916  AST -- 8 9  ALT -- 7 12  ALKPHOS -- 48 62  BILITOT -- 0.3 0.4  PROT -- 6.7 8.5*  ALBUMIN 2.7* 3.1* 4.1    CBC:  Lab 11/20/11 0520 11/19/11 0430 11/18/11 1916  WBC 5.5 6.7 7.9  NEUTROABS -- -- 5.4  HGB 9.4* 10.6* 13.3  HCT 28.0* 31.3* 38.5*  MCV 85.4 87.2 85.6  PLT  242 281 378    Microbiology: Results for orders placed during the hospital encounter of 11/18/11  URINE CULTURE     Status: Normal (Preliminary result)   Collection Time   11/19/11  1:04 AM      Component Value Range Status Comment   Specimen Description URINE, SUPRAPUBIC   Final    Special Requests NONE   Final    Culture  Setup Time [id number]   Final    Colony Count >=100,000 COLONIES/ML   Final    Culture GRAM NEGATIVE RODS   Final    Report Status PENDING   Incomplete     Urinalysis:  Basename 11/18/11 1929  COLORURINE RED*  LABSPEC 1.021  PHURINE 8.5*  GLUCOSEU NEGATIVE  HGBUR NEGATIVE  BILIRUBINUR SMALL*  KETONESUR 15*  PROTEINUR 100*  UROBILINOGEN 0.2  NITRITE NEGATIVE  LEUKOCYTESUR LARGE*      Imaging: Ct Abdomen Pelvis Wo Contrast  11/18/2011  *RADIOLOGY REPORT*  Clinical Data: Abdominal pain, vomiting, diarrhea.  Hypotension. History of bladder cancer.  Recent colostomy.  CT ABDOMEN AND PELVIS WITHOUT CONTRAST  Technique:  Multidetector CT imaging of the abdomen and pelvis was performed following the standard protocol without intravenous contrast.  Comparison: 09/09/2011; 10/07/2011  Findings: Subsegmental atelectasis noted in both lower lobes. Scattered small  hepatic hypodensities probably represent cysts but are technically nonspecific on today's noncontrast CT scan.  The spleen and adrenal glands appear normal.  Bilateral nephrolithiasis is present, and there is a 4 mm calculus at the right UPJ without obstruction.  There is fullness of the left proximal ureter, with poor visualization of the distal ureter.  The ileal conduit is indistinct, as is its attachment to the ostomy site.  Orally administered contrast extends through loops of small bowel and to the level of the rectum.  There are air-fluid levels and scattered pelvic loops of small bowel, which are borderline dilated.  The colon does not appear dilated.  No pathologic adenopathy is currently observed.   Prior cystoprostatectomy noted stable sclerotic marginated lesion in the left iliac bone noted, not significantly changed from 03/18/2011.  IMPRESSION:  1.  Bilateral nephrolithiasis.  There is an apparently nonobstructive 4 mm right UPJ calculus. 2.  Scattered air-fluid levels and borderline dilated loops of small bowel.  However, orally administered contrast makes its way through to the rectum.  Query mild ileus. 3.  The distal ureters and their attachment to the ileal conduit are poorly seen, and better visualization would probably require delayed phase postcontrast imaging.  No definite hydroureter to suggest a urinary obstruction.  No abnormal fluid collection is observed around the ostomy site, and no peristomal hernia is noted. 4.  Hypodense hepatic lesions are similar to prior MRI and probably represent cysts.  Original Report Authenticated By: Dellia Cloud, M.D.   Dg Abd 1 View  11/19/2011  *RADIOLOGY REPORT*  Clinical Data: Evaluate for ileus.  Nausea and hiccups.  History of colon surgery for cancer.  ABDOMEN - 1 VIEW  Comparison: CT abdomen pelvis 11/18/2011  Findings: Oral contrast from yesterday's CT scan has advanced into the colon.  Several diverticula associated with the descending colon are noted.  Gas is seen scattered throughout multiple loops of small bowel and colon.  Some of the small bowel loops are borderline prominent caliber and the left upper quadrant and central abdomen.  Gas is seen to the rectum.  No acute or suspicious bony abnormality.  IMPRESSION:  1.  No evidence of bowel obstruction.  Oral contrast has progressed into the colon from yesterday's CT. 2. Gas throughout the bowel loops, with borderline prominent small bowel loops in the left upper quadrant and central abdomen. No significant change in bowel gas pattern compared to the abdomen pelvis CT of 11/18/2011.  The bowel gas pattern is nonspecific, but could be seen in the setting of gastroenteritis or mild ileus.   Original Report Authenticated By: Britta Mccreedy, M.D.   US Renal  11/19/2011  *RADIOLOGY REPORT*  Clinical Data:  Acute renal failure  RENAL/URINARY TRACT ULTRASOUND COMPLETE  Comparison:  11/18/2011  Findings:  Right Kidney:  The right kidney is diffusely echogenic measuring 12.1 cm. Normal in size.  No mass or hydronephrosis.  Left Kidney:  The left kidney measures 11 cm.  The left kidney is diffusely echogenic.Normal in size and parenchymal echogenicity. No evidence of mass or hydronephrosis. Stone is identified within the inferior pole of the left kidney measuring approximately 6 mm.  Bladder:  Appears normal for degree of bladder distention.  IMPRESSION:  1.  Bilateral echogenic kidneys compatible with medical renal disease. 2.  No evidence for obstructive uropathy.  Original Report Authenticated By: Rosealee Albee, M.D.      Medications:    Infusions:    . sodium chloride 150 mL/hr at 11/20/11 0245  .  DISCONTD: sodium chloride 100 mL/hr at 11/19/11 1042    Scheduled Medications:    . chlorproMAZINE  25 mg Intramuscular Once  . enoxaparin  30 mg Subcutaneous Q24H  . feeding supplement  1 Container Oral TID WC  . levofloxacin (LEVAQUIN) IV  250 mg Intravenous Q48H  . pantoprazole  40 mg Oral Q1200  . simvastatin  40 mg Oral q1800    PRN Medications: acetaminophen, acetaminophen, ondansetron (ZOFRAN) IV, ondansetron (ZOFRAN) IV, DISCONTD: ondansetron (ZOFRAN) IV   Assessment/ Plan:    Pt is a 61 y.o. yo male with a PMHX of bladder and prostate cancer s/p cystoprostectomy with ileal conduit and urostomy, CKD stage III , HTN , CVA (2008), history of recurrent Nephrolithiasis who was admitted to [facility] on 11/18/2011 for evaluation of Nausea/Vomiting and diarrhea as well as mild abdominal pain. Renal service is consulted for acute on chronic renal failure.   1. Acute on chronic renal failure. Likely multifactorial including volume depletion in the setting of nausea/vomiting/diarrhea  and ongoing ACEi use. Improving renal function with IVF from 14 >9 today - Continue IVF  - Strickt I&Os    2. Nausea/Vomiting: Likely due to mild ileus as noted on the CT abdomen (3/18). Other DD include gastroenteritis. Symptoms improving at this point.  -refer to Primary care team   3. Hypotension: Improving with IV fluids. Hold all antihypertensive   4. Metabolic acidosis with anion ZOX:WRUEAV due to starvation and acute renal failure. Improving at this point with fluids.  - Continue to hydration   - Continue to monitor  - May consider to ad bicarb   5. Anemia: Hgb baseline around 13 prior to Feb 2013. After cystoprostectomy in 10/07/11 Hgb down to 9.2. On admission Hgb 13 most likely falsely normal due to volume depletion now at baseline. Patient may have anemia of chronic disease in the setting of CKD and cancer  - continue to monitor closely.  - May repeat UA since significant blood was noted on admission  6. UTI: Urine culture is noted for gram neg. Rods with >100.000 colonies. Currently on Levaquin.  7. Diarrhea: resolved. May consider to d/c C.diff study    Patient history and plan of care reviewed with attending, Dr. Zetta Bills.    Length of Stay: 2 days   Danielle Rankin, Internal Medicine Resident 11/20/2011, 7:15 AM  I have seen and examined this patient and agree with the assessment/plan as outlined above by Loistine Chance MD (PGY2). Renal function continues to improve on IV fluids- will switch to 1/2NS+55mEq/L NaHCO3 to help with volume/acidemia/hypernatremia. No acute HD needs identified.  Sharnay Cashion K.,MD 11/20/2011 10:15 AM

## 2011-11-21 LAB — RENAL FUNCTION PANEL
Albumin: 3.4 g/dL — ABNORMAL LOW (ref 3.5–5.2)
BUN: 61 mg/dL — ABNORMAL HIGH (ref 6–23)
CO2: 19 mEq/L (ref 19–32)
Chloride: 107 mEq/L (ref 96–112)
Creatinine, Ser: 5.39 mg/dL — ABNORMAL HIGH (ref 0.50–1.35)
GFR calc non Af Amer: 10 mL/min — ABNORMAL LOW (ref >90)
Potassium: 4.8 mEq/L (ref 3.5–5.1)

## 2011-11-21 LAB — CBC
Hemoglobin: 12.3 g/dL — ABNORMAL LOW (ref 13.0–17.0)
Platelets: 243 10*3/uL (ref 150–400)
RBC: 4.16 MIL/uL — ABNORMAL LOW (ref 4.22–5.81)
WBC: 6.5 10*3/uL (ref 4.0–10.5)

## 2011-11-21 MED ORDER — ZOLPIDEM TARTRATE 5 MG PO TABS
5.0000 mg | ORAL_TABLET | Freq: Once | ORAL | Status: AC
  Administered 2011-11-21: 5 mg via ORAL
  Filled 2011-11-21: qty 1

## 2011-11-21 MED ORDER — LEVOFLOXACIN 250 MG PO TABS
250.0000 mg | ORAL_TABLET | ORAL | Status: DC
  Filled 2011-11-21: qty 1

## 2011-11-21 MED ORDER — PRAVASTATIN SODIUM 40 MG PO TABS
40.0000 mg | ORAL_TABLET | Freq: Every day | ORAL | Status: DC
  Administered 2011-11-21 – 2011-11-22 (×2): 40 mg via ORAL
  Filled 2011-11-21 (×2): qty 1

## 2011-11-21 MED ORDER — AMLODIPINE BESYLATE 10 MG PO TABS
10.0000 mg | ORAL_TABLET | Freq: Every day | ORAL | Status: DC
  Administered 2011-11-21 – 2011-11-22 (×2): 10 mg via ORAL
  Filled 2011-11-21 (×2): qty 1

## 2011-11-21 MED ORDER — SODIUM CHLORIDE 0.45 % IV SOLN
INTRAVENOUS | Status: AC
  Administered 2011-11-21: 12:00:00 via INTRAVENOUS
  Filled 2011-11-21 (×2): qty 1000

## 2011-11-21 NOTE — Progress Notes (Signed)
Subjective: Tolerating PO, no fevers/chills, diarrhea resolved Feeling much better   Objective: Vital signs in last 24 hours: Filed Vitals:   11/20/11 1400 11/20/11 1700 11/20/11 2150 11/21/11 0526  BP: 128/78 133/85 129/94 150/106  Pulse: 86 80 96 97  Temp: 98.2 F (36.8 C) 97.9 F (36.6 C) 98.4 F (36.9 C) 98 F (36.7 C)  TempSrc: Oral Oral Oral Oral  Resp: 18 18 18 18   Height:      Weight:   68.5 kg (151 lb 0.2 oz)   SpO2: 100% 100% 100% 100%   Weight change:   Intake/Output Summary (Last 24 hours) at 11/21/11 0832 Last data filed at 11/21/11 0700  Gross per 24 hour  Intake 2420.33 ml  Output   5200 ml  Net -2779.67 ml    Physical Exam: General: Awake, Oriented, No acute distress. HEENT: EOMI. Neck: Supple CV: S1 and S2, rrr Lungs: Clear to ascultation bilaterally, no wheezes Abdomen: Soft, Nontender, Nondistended, +bowel sounds. Ext: Good pulses. Trace edema.  Lab Results:  [facility] 11/21/11 0515 11/20/11 0520  NA 142 142  K 4.8 4.2  CL 107 112  CO2 19 18*  GLUCOSE 96 85  BUN 61* 83*  CREATININE 5.39* 9.01*  CALCIUM 9.2 8.5  MG -- --  PHOS 3.8 4.5    Basename 11/21/11 0515 11/20/11 0520 11/19/11 0430 11/18/11 1916  AST -- -- 8 9  ALT -- -- 7 12  ALKPHOS -- -- 48 62  BILITOT -- -- 0.3 0.4  PROT -- -- 6.7 8.5*  ALBUMIN 3.4* 2.7* -- --   No results found for this basename: LIPASE:2,AMYLASE:2 in the last 72 hours  Basename 11/21/11 0515 11/20/11 0520 11/18/11 1916  WBC 6.5 5.5 --  NEUTROABS -- -- 5.4  HGB 12.3* 9.4* --  HCT 35.3* 28.0* --  MCV 84.9 85.4 --  PLT 243 242 --   No results found for this basename: CKTOTAL:3,CKMB:3,CKMBINDEX:3,TROPONINI:3 in the last 72 hours No components found with this basename: POCBNP:3 No results found for this basename: DDIMER:2 in the last 72 hours No results found for this basename: HGBA1C:2 in the last 72 hours No results found for this basename: CHOL:2,HDL:2,LDLCALC:2,TRIG:2,CHOLHDL:2,LDLDIRECT:2 in  the last 72 hours  Basename 11/19/11 0430  TSH 0.544  T4TOTAL --  T3FREE --  THYROIDAB --   No results found for this basename: VITAMINB12:2,FOLATE:2,FERRITIN:2,TIBC:2,IRON:2,RETICCTPCT:2 in the last 72 hours  Micro Results: Recent Results (from the past 240 hour(s))  URINE CULTURE     Status: Normal   Collection Time   11/19/11  1:04 AM      Component Value Range Status Comment   Specimen Description URINE, SUPRAPUBIC   Final    Special Requests NONE   Final    Culture  Setup Time [id number]   Final    Colony Count >=100,000 COLONIES/ML   Final    Culture ENTEROBACTER AEROGENES   Final    Report Status 11/20/2011 FINAL   Final    Organism ID, Bacteria ENTEROBACTER AEROGENES   Final     Studies/Results: Dg Abd 1 View  11/19/2011  *RADIOLOGY REPORT*  Clinical Data: Evaluate for ileus.  Nausea and hiccups.  History of colon surgery for cancer.  ABDOMEN - 1 VIEW  Comparison: CT abdomen pelvis 11/18/2011  Findings: Oral contrast from yesterday's CT scan has advanced into the colon.  Several diverticula associated with the descending colon are noted.  Gas is seen scattered throughout multiple loops of small bowel and colon.  Some of the  small bowel loops are borderline prominent caliber and the left upper quadrant and central abdomen.  Gas is seen to the rectum.  No acute or suspicious bony abnormality.  IMPRESSION:  1.  No evidence of bowel obstruction.  Oral contrast has progressed into the colon from yesterday's CT. 2. Gas throughout the bowel loops, with borderline prominent small bowel loops in the left upper quadrant and central abdomen. No significant change in bowel gas pattern compared to the abdomen pelvis CT of 11/18/2011.  The bowel gas pattern is nonspecific, but could be seen in the setting of gastroenteritis or mild ileus.  Original Report Authenticated By: Britta Mccreedy, M.D.   US Renal  11/19/2011  *RADIOLOGY REPORT*  Clinical Data:  Acute renal failure  RENAL/URINARY TRACT  ULTRASOUND COMPLETE  Comparison:  11/18/2011  Findings:  Right Kidney:  The right kidney is diffusely echogenic measuring 12.1 cm. Normal in size.  No mass or hydronephrosis.  Left Kidney:  The left kidney measures 11 cm.  The left kidney is diffusely echogenic.Normal in size and parenchymal echogenicity. No evidence of mass or hydronephrosis. Stone is identified within the inferior pole of the left kidney measuring approximately 6 mm.  Bladder:  Appears normal for degree of bladder distention.  IMPRESSION:  1.  Bilateral echogenic kidneys compatible with medical renal disease. 2.  No evidence for obstructive uropathy.  Original Report Authenticated By: Rosealee Albee, M.D.    Medications: I have reviewed the patient's current medications. Scheduled Meds:    . enoxaparin  30 mg Subcutaneous Q24H  . feeding supplement  1 Container Oral TID WC  . levofloxacin (LEVAQUIN) IV  250 mg Intravenous Q48H  . pantoprazole  40 mg Oral Q1200  . simvastatin  40 mg Oral q1800   Continuous Infusions:    . sodium chloride 0.45 % 1,000 mL with sodium bicarbonate 50 mEq infusion 100 mL/hr at 11/21/11 0209  . DISCONTD: sodium chloride 150 mL/hr at 11/20/11 0245  . DISCONTD: sodium chloride 0.45 % 2,000 mL with sodium bicarbonate 50 mEq infusion     PRN Meds:.acetaminophen, acetaminophen, ondansetron (ZOFRAN) IV  Assessment/Plan: Pt is a 61 y.o. yo male with a PMHX of bladder and prostate cancer s/p cystoprostectomy with ileal conduit and urostomy, CKD stage III , HTN , CVA (2008), history of recurrent Nephrolithiasis who was admitted to [facility] on 11/18/2011 for evaluation of Nausea/Vomiting and diarrhea as well as mild abdominal pain. Renal service is consulted for acute on chronic renal failure.   1. Acute on chronic renal failure. Likely multifactorial including volume depletion in the setting of nausea/vomiting/diarrhea and ongoing ACEi use. Improving renal function with IVF baseline about 1.4 - Continue IVF   - Strickt I&Os  Nephrology consulted  2. Nausea/Vomiting: Symptoms improved, ? ileus.   3. Hypotension: Improving with IV fluids. Resolved, restart antihypertensives in stepwise fashion avoiding ACE at this point  4. Metabolic acidosis with anion UUV:OZDGUY due to starvation and acute renal failure. Improving at this point with fluids.  - Continue to hydration - with change in IVF - Continue to monitor   5. Anemia: back up despite fluid boluses  6. UTI/enterobacter: Urine culture is noted for gram neg. Rods with >100.000 colonies. Currently on Levaquin. Day #4  7. Diarrhea: resolved.  8. nonobstructive 4 mm right UPJ calculus  Hope for D/C tomm or next day   LOS: 3 days  Lindsea Olivar, DO 11/21/2011, 8:32 AM

## 2011-11-21 NOTE — Progress Notes (Signed)
[facility] PROGRESS NOTE Resident Note   Please see below for attending addendum to resident note.  Subjective:    Patient noted that he is doing a lot better. Denies nausea, vomiting, diarrhea, hiccups.    Objective:    Vital Signs:   Temp:  [97.9 F (36.6 C)-98.6 F (37 C)] 98 F (36.7 C) (03/21 0526) Pulse Rate:  [80-97] 97  (03/21 0526) Resp:  [18-20] 18  (03/21 0526) BP: (128-150)/(78-106) 150/106 mmHg (03/21 0526) SpO2:  [100 %] 100 % (03/21 0526) Weight:  [151 lb 0.2 oz (68.5 kg)] 151 lb 0.2 oz (68.5 kg) (03/20 2150) Last BM Date: 11/18/11  24-hour weight change: Weight change:   Weight trends: Filed Weights   11/19/11 0040 11/19/11 0302 11/20/11 2150  Weight: 176 lb 2.4 oz (79.9 kg) 153 lb 7 oz (69.6 kg) 151 lb 0.2 oz (68.5 kg)    Intake/Output:  03/20 0701 - 03/21 0700 In: 2420.3 [P.O.:720; I.V.:1698.3; IV Piggyback:2] Out: 2800 [Urine:2800]  Physical Exam: General: Vital signs reviewed and noted. Well-developed, well-nourished, in no acute distress; alert, appropriate and cooperative throughout examination.  Lungs:  Normal respiratory effort. Clear to auscultation BL without crackles or wheezes.  Heart: RRR. S1 and S2 normal  Abdomen:  BS normoactive. Soft, Nondistended, non-tender.    Extremities: No pretibial edema.     Labs: Basic Metabolic Panel:  Lab 11/21/11 1610 11/20/11 0520 11/19/11 1551 11/19/11 0430 11/18/11 1916  NA 142 142 140 139 136  K 4.8 4.2 4.3 4.3 4.3  CL 107 112 106 101 92*  CO2 19 18* 20 19 23   GLUCOSE 96 85 107* 98 131*  BUN 61* 83* 98* 100* 108*  CREATININE 5.39* 9.01* 11.47* 13.40* 14.43*  CALCIUM 9.2 8.5 8.6 -- --  MG -- -- -- -- --  PHOS 3.8 4.5 -- -- --    Liver Function Tests:  Lab 11/21/11 0515 11/20/11 0520 11/19/11 0430 11/18/11 1916  AST -- -- 8 9  ALT -- -- 7 12  ALKPHOS -- -- 48 62  BILITOT -- -- 0.3 0.4  PROT -- -- 6.7 8.5*  ALBUMIN 3.4* 2.7* 3.1* 4.1    CBC:  Lab 11/21/11 0515 11/20/11 0520  11/19/11 0430 11/18/11 1916  WBC 6.5 5.5 6.7 7.9  NEUTROABS -- -- -- 5.4  HGB 12.3* 9.4* 10.6* 13.3  HCT 35.3* 28.0* 31.3* 38.5*  MCV 84.9 85.4 87.2 85.6  PLT 243 242 281 378     Microbiology: Results for orders placed during the hospital encounter of 11/18/11  URINE CULTURE     Status: Normal   Collection Time   11/19/11  1:04 AM      Component Value Range Status Comment   Specimen Description URINE, SUPRAPUBIC   Final    Special Requests NONE   Final    Culture  Setup Time [id number]   Final    Colony Count >=100,000 COLONIES/ML   Final    Culture ENTEROBACTER AEROGENES   Final    Report Status 11/20/2011 FINAL   Final    Organism ID, Bacteria ENTEROBACTER AEROGENES   Final      Urinalysis:  Basename 11/18/11 1929  COLORURINE RED*  LABSPEC 1.021  PHURINE 8.5*  GLUCOSEU NEGATIVE  HGBUR NEGATIVE  BILIRUBINUR SMALL*  KETONESUR 15*  PROTEINUR 100*  UROBILINOGEN 0.2  NITRITE NEGATIVE  LEUKOCYTESUR LARGE*      Imaging: Dg Abd 1 View  11/19/2011  *RADIOLOGY REPORT*  Clinical Data: Evaluate for ileus.  Nausea and  hiccups.  History of colon surgery for cancer.  ABDOMEN - 1 VIEW  Comparison: CT abdomen pelvis 11/18/2011  Findings: Oral contrast from yesterday's CT scan has advanced into the colon.  Several diverticula associated with the descending colon are noted.  Gas is seen scattered throughout multiple loops of small bowel and colon.  Some of the small bowel loops are borderline prominent caliber and the left upper quadrant and central abdomen.  Gas is seen to the rectum.  No acute or suspicious bony abnormality.  IMPRESSION:  1.  No evidence of bowel obstruction.  Oral contrast has progressed into the colon from yesterday's CT. 2. Gas throughout the bowel loops, with borderline prominent small bowel loops in the left upper quadrant and central abdomen. No significant change in bowel gas pattern compared to the abdomen pelvis CT of 11/18/2011.  The bowel gas pattern is  nonspecific, but could be seen in the setting of gastroenteritis or mild ileus.  Original Report Authenticated By: Britta Mccreedy, M.D.   US Renal  11/19/2011  *RADIOLOGY REPORT*  Clinical Data:  Acute renal failure  RENAL/URINARY TRACT ULTRASOUND COMPLETE  Comparison:  11/18/2011  Findings:  Right Kidney:  The right kidney is diffusely echogenic measuring 12.1 cm. Normal in size.  No mass or hydronephrosis.  Left Kidney:  The left kidney measures 11 cm.  The left kidney is diffusely echogenic.Normal in size and parenchymal echogenicity. No evidence of mass or hydronephrosis. Stone is identified within the inferior pole of the left kidney measuring approximately 6 mm.  Bladder:  Appears normal for degree of bladder distention.  IMPRESSION:  1.  Bilateral echogenic kidneys compatible with medical renal disease. 2.  No evidence for obstructive uropathy.  Original Report Authenticated By: Rosealee Albee, M.D.      Medications:    Infusions:    . sodium chloride 0.45 % 1,000 mL with sodium bicarbonate 50 mEq infusion 100 mL/hr at 11/21/11 0209  . DISCONTD: sodium chloride 150 mL/hr at 11/20/11 0245  . DISCONTD: sodium chloride 0.45 % 2,000 mL with sodium bicarbonate 50 mEq infusion      Scheduled Medications:    . chlorproMAZINE  25 mg Intramuscular Once  . enoxaparin  30 mg Subcutaneous Q24H  . feeding supplement  1 Container Oral TID WC  . levofloxacin (LEVAQUIN) IV  250 mg Intravenous Q48H  . pantoprazole  40 mg Oral Q1200  . simvastatin  40 mg Oral q1800    PRN Medications: acetaminophen, acetaminophen, ondansetron (ZOFRAN) IV   Assessment/ Plan:    Pt is a 61 y.o. yo male with a PMHX of bladder and prostate cancer s/p cystoprostectomy with ileal conduit and urostomy, CKD stage III , HTN , CVA (2008), history of recurrent Nephrolithiasis who was admitted to [facility] on 11/18/2011 for evaluation of Nausea/Vomiting and diarrhea as well as mild abdominal pain. Renal service is consulted for  acute on chronic renal failure.   1. Acute on chronic renal failure. Improving. Good urine out put with 2.8 liters.  Creatinine from 14 to 5.3 today. Likely multifactorial including volume depletion in the setting of nausea/vomiting/diarrhea and ongoing ACEi use.  - Continue IVF  - Strickt I&Os   2. Nausea/Vomiting: Likely due to mild ileus as noted on the CT abdomen (3/18). Other DD include gastroenteritis. Symptoms improving at this point.  -refer to Primary care team   3. Hypotension: resolved with IV fluids. BP at this point mildly elevated with 115/76-129/76. Currently holding all antihypertensive . May consider to  restart Norvasc if BP continues to be elevated ( home meds include Norvasc, Clonidine, Lisinopril- hold lisinopril until creatinine below 2)   4. Metabolic acidosis with anion AVW:[id number] due to starvation and acute renal failure. Improving at this point with fluids.  - Changed IVF to 1/2 NS with 1 amp of bicarb for 2 liters yesterday. Consider to continue with IVF since patient bicarb is low. - Continue to monitor   5. Anemia: Hgb improved to 12.3.  Hgb baseline around 13 prior to Feb 2013. After cystoprostectomy in 10/07/11 Hgb down to 9.2.  Patient may have anemia of chronic disease in the setting of CKD and cancer. - continue to monitor closely.   6. UTI: Urine culture is noted for Enterobacter Aerogenes >100.000 colonies. Currently on Levaquin.   7. Diarrhea: resolved.   Patient history and plan of care reviewed with attending, Dr. Zetta Bills.     Length of Stay: 3 days   Mayme Genta, Internal Medicine Resident 11/21/2011, 7:18 AM  I have seen and examined this patient and agree with the assessment/plan as outlined above by Loistine Chance MD (PGY2). Continue IVFs X1 more liter and allow liberal oral intake- will continue to follow renal function with you- no acute electrolyte/volume concerns. Stevie Ertle K.,MD 11/21/2011 9:51 AM

## 2011-11-22 LAB — BASIC METABOLIC PANEL
BUN: 49 mg/dL — ABNORMAL HIGH (ref 6–23)
CO2: 22 mEq/L (ref 19–32)
Chloride: 111 mEq/L (ref 96–112)
Creatinine, Ser: 3.88 mg/dL — ABNORMAL HIGH (ref 0.50–1.35)
Glucose, Bld: 137 mg/dL — ABNORMAL HIGH (ref 70–99)
Potassium: 4.1 mEq/L (ref 3.5–5.1)

## 2011-11-22 LAB — CBC
HCT: 32.3 % — ABNORMAL LOW (ref 39.0–52.0)
Hemoglobin: 10.9 g/dL — ABNORMAL LOW (ref 13.0–17.0)
MCV: 87.3 fL (ref 78.0–100.0)
WBC: 5.8 10*3/uL (ref 4.0–10.5)

## 2011-11-22 MED ORDER — LEVOFLOXACIN 250 MG PO TABS
250.0000 mg | ORAL_TABLET | Freq: Every day | ORAL | Status: DC
  Filled 2011-11-22: qty 1

## 2011-11-22 MED ORDER — LEVOFLOXACIN 250 MG PO TABS: 250.0000 mg | ORAL_TABLET | Freq: Every day | ORAL | Status: AC

## 2011-11-22 NOTE — Progress Notes (Signed)
ANTIBIOTIC CONSULT NOTE - FOLLOW UP  Pharmacy Consult for Levaquin Indication: Enterobacter UTI  Allergies  Allergen Reactions  . Penicillins Other (See Comments)    HAD WHEN WAS A CHILD   . Pregabalin Swelling    Patient Measurements: Height: 5' 8.11" (173 cm) Weight: 161 lb 2.5 oz (73.1 kg) IBW/kg (Calculated) : 68.65  Adjusted Body Weight:   Vital Signs: Temp: 97.8 F (36.6 C) (03/22 1006) Temp src: Oral (03/22 1006) BP: 114/80 mmHg (03/22 1006) Pulse Rate: 87  (03/22 1006) Intake/Output from previous day: 03/21 0701 - 03/22 0700 In: 2040 [P.O.:960; I.V.:1080] Out: 1700 [Urine:1700] Intake/Output from this shift: Total I/O In: 120 [P.O.:120] Out: -   Labs:  Basename 11/22/11 0800 11/21/11 0515 11/20/11 0520  WBC 5.8 6.5 5.5  HGB 10.9* 12.3* 9.4*  PLT 257 243 242  LABCREA -- -- --  CREATININE 3.88* 5.39* 9.01*   Estimated Creatinine Clearance: 19.4 ml/min (by C-G formula based on Cr of 3.88). No results found for this basename: VANCOTROUGH:2,VANCOPEAK:2,VANCORANDOM:2,GENTTROUGH:2,GENTPEAK:2,GENTRANDOM:2,TOBRATROUGH:2,TOBRAPEAK:2,TOBRARND:2,AMIKACINPEAK:2,AMIKACINTROU:2,AMIKACIN:2, in the last 72 hours   Microbiology: Recent Results (from the past 720 hour(s))  URINE CULTURE     Status: Normal   Collection Time   11/19/11  1:04 AM      Component Value Range Status Comment   Specimen Description URINE, SUPRAPUBIC   Final    Special Requests NONE   Final    Culture  Setup Time [id number]   Final    Colony Count >=100,000 COLONIES/ML   Final    Culture ENTEROBACTER AEROGENES   Final    Report Status 11/20/2011 FINAL   Final    Organism ID, Bacteria ENTEROBACTER AEROGENES   Final     Anti-infectives     Start     Dose/Rate Route Frequency Ordered Stop   11/22/11 2200   levofloxacin (LEVAQUIN) tablet 250 mg        250 mg Oral Every 48 hours 11/21/11 1244     11/21/11 0000   Levofloxacin (LEVAQUIN) IVPB 250 mg  Status:  Discontinued        250 mg 50  mL/hr over 60 Minutes Intravenous Every 48 hours 11/19/11 0046 11/21/11 1244   11/19/11 0130   levofloxacin (LEVAQUIN) IVPB 500 mg        500 mg 100 mL/hr over 60 Minutes Intravenous  Once 11/19/11 0046 11/19/11 0216          Assessment: 61yom on Levaquin Day 4 for Enterobacter UTI. Patient has remained afebrile and WBC wnl. Patient's SCr continues to improve, CrCl is now ~ 49ml/min --> will adjust Levaquin dose accordingly.  Goal of Therapy:  Clinical improvement  Plan:  1. Change Levaquin to 250mg  PO q24h with tonight's dose 2. Follow-up renal function and LOT  Cleon Dew [phone] 11/22/2011,11:53 AM

## 2011-11-22 NOTE — Progress Notes (Signed)
[facility]ESS NOTE Resident Note   Please see below for attending addendum to resident note.  Subjective:   Denies N/V and diarrhea. Tolerating po intake well. Off IV fluids since yesterday.    Objective:    Vital Signs:   Temp:  [97.9 F (36.6 C)-98.4 F (36.9 C)] 98.1 F (36.7 C) (03/22 0556) Pulse Rate:  [54-85] 54  (03/22 0556) Resp:  [18-19] 18  (03/22 0556) BP: (122-138)/(80-94) 122/85 mmHg (03/22 0556) SpO2:  [100 %] 100 % (03/22 0556) Weight:  [161 lb 2.5 oz (73.1 kg)] 161 lb 2.5 oz (73.1 kg) (03/21 2200) Last BM Date: 11/18/11  24-hour weight change: Weight change: 10 lb 2.3 oz (4.6 kg)  Weight trends: Filed Weights   11/19/11 0302 11/20/11 2150 11/21/11 2200  Weight: 153 lb 7 oz (69.6 kg) 151 lb 0.2 oz (68.5 kg) 161 lb 2.5 oz (73.1 kg)    Intake/Output:  03/21 0701 - 03/22 0700 In: 2040 [P.O.:960; I.V.:1080] Out: 1700 [Urine:1700]  Physical Exam: General: Vital signs reviewed and noted. Well-developed, well-nourished, in no acute distress; alert, appropriate and cooperative throughout examination.  Lungs:  Normal respiratory effort. Clear to auscultation BL without crackles or wheezes.  Heart: RRR. S1 and S2 normal .  Abdomen:  BS normoactive. Soft, Nondistended, non-tender.    Extremities: No pretibial edema.     Labs: Basic Metabolic Panel:  Lab 11/21/11 1610 11/20/11 0520 11/19/11 1551 11/19/11 0430 11/18/11 1916  NA 142 142 140 139 136  K 4.8 4.2 4.3 4.3 4.3  CL 107 112 106 101 92*  CO2 19 18* 20 19 23   GLUCOSE 96 85 107* 98 131*  BUN 61* 83* 98* 100* 108*  CREATININE 5.39* 9.01* 11.47* 13.40* 14.43*  CALCIUM 9.2 8.5 8.6 -- --  MG -- -- -- -- --  PHOS 3.8 4.5 -- -- --    Liver Function Tests:  Lab 11/21/11 0515 11/20/11 0520 11/19/11 0430 11/18/11 1916  AST -- -- 8 9  ALT -- -- 7 12  ALKPHOS -- -- 48 62  BILITOT -- -- 0.3 0.4  PROT -- -- 6.7 8.5*  ALBUMIN 3.4* 2.7* 3.1* 4.1    CBC:  Lab 11/21/11 0515 11/20/11 0520  11/19/11 0430 11/18/11 1916  WBC 6.5 5.5 6.7 7.9  NEUTROABS -- -- -- 5.4  HGB 12.3* 9.4* 10.6* 13.3  HCT 35.3* 28.0* 31.3* 38.5*  MCV 84.9 85.4 87.2 85.6  PLT 243 242 281 378     Microbiology: Results for orders placed during the hospital encounter of 11/18/11  URINE CULTURE     Status: Normal   Collection Time   11/19/11  1:04 AM      Component Value Range Status Comment   Specimen Description URINE, SUPRAPUBIC   Final    Special Requests NONE   Final    Culture  Setup Time [id number]   Final    Colony Count >=100,000 COLONIES/ML   Final    Culture ENTEROBACTER AEROGENES   Final    Report Status 11/20/2011 FINAL   Final    Organism ID, Bacteria ENTEROBACTER AEROGENES   Final        Medications:    Infusions:    . sodium chloride 0.45 % 1,000 mL with sodium bicarbonate 50 mEq infusion 100 mL/hr at 11/21/11 0209  . sodium chloride 0.45 % 1,000 mL with sodium bicarbonate 50 mEq infusion 100 mL/hr at 11/21/11 1212    Scheduled Medications:    . amLODipine  10 mg  Oral Daily  . enoxaparin  30 mg Subcutaneous Q24H  . feeding supplement  1 Container Oral TID WC  . levofloxacin  250 mg Oral Q48H  . pantoprazole  40 mg Oral Q1200  . pravastatin  40 mg Oral q1800  . zolpidem  5 mg Oral Once  . DISCONTD: levofloxacin (LEVAQUIN) IV  250 mg Intravenous Q48H  . DISCONTD: simvastatin  40 mg Oral q1800    PRN Medications: acetaminophen, acetaminophen, ondansetron (ZOFRAN) IV   Assessment/ Plan:   Pt is a 61 y.o. yo male with a PMHX of bladder and prostate cancer s/p cystoprostectomy with ileal conduit and urostomy, CKD stage III , HTN , CVA (2008), history of recurrent Nephrolithiasis who was admitted to [facility] on 11/18/2011 for evaluation of Nausea/Vomiting and diarrhea as well as mild abdominal pain. Renal service is consulted for acute on chronic renal failure.   1. Acute on chronic renal failure. Improving. Good urine out put with 1.7 liters.  Likely multifactorial  including volume depletion in the setting of nausea/vomiting/diarrhea and ongoing ACEi use.  Awaiting labs from this morning to review renal function.  - Continue to monitor off fluids  - Strickt I&Os   2. Nausea/Vomiting: Likely due to mild ileus as noted on the CT abdomen (3/18). Other DD include gastroenteritis. Symptoms improving at this point.  -refer to Primary care team   3. Hypotension: resolved with IV fluids. BP well controlled on Norvasc 10 mg.  Hold lisinopril until creatinine below 2.    4. Anemia: Hgb improved to 12.3. Hgb baseline around 13 prior to Feb 2013. After cystoprostectomy in 10/07/11 Hgb down to 9.2. Patient may have anemia of chronic disease in the setting of CKD and cancer.  - continue to monitor closely.   5. UTI: Urine culture is noted for Enterobacter Aerogenes >100.000 colonies. Currently on Levaquin.   6. Diarrhea: resolved.   Patient history and plan of care reviewed with attending, Dr. Zetta Bills.    Length of Stay: 4 days   Mayme Genta, Internal Medicine Resident 11/22/2011, 7:36 AM  I have seen and examined this patient and agree with the assessment/plan as outlined above by Loistine Chance MD (PGY2). Renal function continues to improve after ARF from volume depletion. PO intake is satisfactory and labs/volume status appear stable for DC later today. He should follow up with his PCP in 1-2 weeks with labs in the interim. Can f/u with renal if need perceived by his PCP  Kekoa Fyock K.,MD 11/22/2011 12:34 PM

## 2011-11-22 NOTE — Progress Notes (Signed)
Pt. Got d/c orders,instructions and prescriptions were given to pt. IV was removed.

## 2011-11-22 NOTE — Progress Notes (Signed)
Subjective: Tolerating PO, no fevers/chills, diarrhea resolved Feeling much better   Objective: Vital signs in last 24 hours: Filed Vitals:   11/21/11 1800 11/21/11 2200 11/22/11 0556 11/22/11 1006  BP: 138/94 122/82 122/85 114/80  Pulse: 85 71 54 87  Temp: 98 F (36.7 C) 97.9 F (36.6 C) 98.1 F (36.7 C) 97.8 F (36.6 C)  TempSrc: Oral Oral Oral Oral  Resp: 18 18 18 18   Height:      Weight:  73.1 kg (161 lb 2.5 oz)    SpO2: 100% 100% 100% 100%   Weight change: 4.6 kg (10 lb 2.3 oz)  Intake/Output Summary (Last 24 hours) at 11/22/11 1240 Last data filed at 11/22/11 0845  Gross per 24 hour  Intake   1920 ml  Output   1450 ml  Net    470 ml    Physical Exam: General: Awake, Oriented, No acute distress. HEENT: EOMI. Neck: Supple CV: S1 and S2, rrr Lungs: Clear to ascultation bilaterally, no wheezes Abdomen: Soft, Nontender, Nondistended, +bowel sounds. Ext: Good pulses. Trace edema.  Lab Results:  [facility] 11/22/11 0800 11/21/11 0515 11/20/11 0520  NA 145 142 --  K 4.1 4.8 --  CL 111 107 --  CO2 22 19 --  GLUCOSE 137* 96 --  BUN 49* 61* --  CREATININE 3.88* 5.39* --  CALCIUM 8.3* 9.2 --  MG -- -- --  PHOS -- 3.8 4.5    Basename 11/21/11 0515 11/20/11 0520  AST -- --  ALT -- --  ALKPHOS -- --  BILITOT -- --  PROT -- --  ALBUMIN 3.4* 2.7*   No results found for this basename: LIPASE:2,AMYLASE:2 in the last 72 hours  Basename 11/22/11 0800 11/21/11 0515  WBC 5.8 6.5  NEUTROABS -- --  HGB 10.9* 12.3*  HCT 32.3* 35.3*  MCV 87.3 84.9  PLT 257 243   No results found for this basename: CKTOTAL:3,CKMB:3,CKMBINDEX:3,TROPONINI:3 in the last 72 hours No components found with this basename: POCBNP:3 No results found for this basename: DDIMER:2 in the last 72 hours No results found for this basename: HGBA1C:2 in the last 72 hours No results found for this basename: CHOL:2,HDL:2,LDLCALC:2,TRIG:2,CHOLHDL:2,LDLDIRECT:2 in the last 72 hours No results found  for this basename: TSH,T4TOTAL,FREET3,T3FREE,THYROIDAB in the last 72 hours No results found for this basename: VITAMINB12:2,FOLATE:2,FERRITIN:2,TIBC:2,IRON:2,RETICCTPCT:2 in the last 72 hours  Micro Results: Recent Results (from the past 240 hour(s))  URINE CULTURE     Status: Normal   Collection Time   11/19/11  1:04 AM      Component Value Range Status Comment   Specimen Description URINE, SUPRAPUBIC   Final    Special Requests NONE   Final    Culture  Setup Time [id number]   Final    Colony Count >=100,000 COLONIES/ML   Final    Culture ENTEROBACTER AEROGENES   Final    Report Status 11/20/2011 FINAL   Final    Organism ID, Bacteria ENTEROBACTER AEROGENES   Final     Studies/Results: No results found.  Medications: I have reviewed the patient's current medications. Scheduled Meds:    . amLODipine  10 mg Oral Daily  . enoxaparin  30 mg Subcutaneous Q24H  . feeding supplement  1 Container Oral TID WC  . levofloxacin  250 mg Oral Daily  . pantoprazole  40 mg Oral Q1200  . pravastatin  40 mg Oral q1800  . zolpidem  5 mg Oral Once  . DISCONTD: levofloxacin (LEVAQUIN) IV  250 mg Intravenous Q48H  .  DISCONTD: levofloxacin  250 mg Oral Q48H   Continuous Infusions:    . sodium chloride 0.45 % 1,000 mL with sodium bicarbonate 50 mEq infusion 100 mL/hr at 11/21/11 1212   PRN Meds:.acetaminophen, acetaminophen, ondansetron (ZOFRAN) IV  Assessment/Plan: Pt is a 61 y.o. yo male with a PMHX of bladder and prostate cancer s/p cystoprostectomy with ileal conduit and urostomy, CKD stage III , HTN , CVA (2008), history of recurrent Nephrolithiasis who was admitted to [facility] on 11/18/2011 for evaluation of Nausea/Vomiting and diarrhea as well as mild abdominal pain. Renal service is consulted for acute on chronic renal failure.   1. Acute on chronic renal failure. Likely multifactorial including volume depletion in the setting of nausea/vomiting/diarrhea and ongoing ACEi use. Improving  renal function with IVF baseline about 1.4 - Continue IVF  - Strickt I&Os  Nephrology consulted  2. Nausea/Vomiting: Symptoms improved, ? ileus.   3. Hypotension: Improving with IV fluids. Resolved, restart antihypertensives in stepwise fashion avoiding ACE at this point  4. Metabolic acidosis with anion ZOX:WRUEAV due to starvation and acute renal failure. Improving at this point with fluids.  - Continue to hydration - with change in IVF - Continue to monitor   5. Anemia: back up despite fluid boluses  6. UTI/enterobacter: Urine culture is noted for gram neg. Rods with >100.000 colonies. Currently on Levaquin. Day #4  7. Diarrhea: resolved.  8. nonobstructive 4 mm right UPJ calculus  Plan to D/C home today   LOS: 4 days  Brinley Rosete, DO 11/22/2011, 12:40 PM

## 2011-11-22 NOTE — Progress Notes (Signed)
  [facility]GRESS NOTE     Basic Metabolic Panel:    Component Value Date/Time   NA 145 11/22/2011 0800   K 4.1 11/22/2011 0800   CL 111 11/22/2011 0800   CO2 22 11/22/2011 0800   BUN 49* 11/22/2011 0800   CREATININE 3.88* 11/22/2011 0800   GLUCOSE 137* 11/22/2011 0800   CALCIUM 8.3* 11/22/2011 0800      Patient has had continued improvement of renal function.   Therefore, renal service will sign-off at this time. Patient is ok for discharge from a renal standpoint, as per the discretion of the primary team.  He should follow-up with his PCP.  Discussed with attending Dr. Zetta Bills.  Johnette Abraham, Norman Herrlich, Internal Medicine Resident 11/22/2011, 12:37 PM

## 2011-11-22 NOTE — Progress Notes (Signed)
   CARE MANAGEMENT NOTE 11/22/2011  Patient:  [facility]   Account Number:  [id number]  Date Initiated:  11/19/2011  Documentation initiated by:  [facility]  Subjective/Objective Assessment:   ADMITTED W/DIARRHEA [id number] CA.     Action/Plan:   FROM HOME,ALONE.ACTIVE W/[facility]/PT.   Anticipated DC Date:  11/22/2011   Anticipated DC Plan:  HOME W HOME HEALTH SERVICES      DC Planning Services  CM consult      [facility] Choice  Resumption Of Svcs/PTA Provider   Choice offered to / List presented to:  C-1 Patient        HH arranged  HH-1 RN  HH-2 PT      [facility] agency  [facility]   Status of service:  Completed, signed off Medicare Important Message given?   (If response is "NO", the following Medicare IM given date fields will be blank) Date Medicare IM given:   Date Additional Medicare IM given:    Discharge Disposition:  HOME W HOME HEALTH SERVICES  Per UR Regulation:  Reviewed for med. necessity/level of care/duration of stay  If discussed at Long Length of Stay Meetings, dates discussed:    Comments:  11/22/11 13:49 Letha Cape RN, BSN [phone] patient for dc today, resuming hhrn and hhpr with [facility], referral made to [facility] and [facility] notified.  11/20/11 17:02 Letha Cape RN, BSN [phone] patient lives alone, patient has medication coverage and transportation.  PTA independent.  11/19/11 KATHY MAHABIR RN,BSN NCM [phone] [facility] C#[phone] INFORMED OF ADMISSION, & TRANSFER TO [facility].

## 2011-11-22 NOTE — Discharge Summary (Signed)
Discharge Summary  William Collier MR#: [id number]  DOB:08-29-1951  Date of Admission: 11/18/2011 Date of Discharge: 11/22/2011  Patient's PCP: Julieanne Manson, MD, MD  Attending Physician:Mayreli Alden  Consults: Treatment Team:  Hartley Barefoot. Allena Katz, MD   Discharge Diagnoses: Principal Problem:  *Acute renal failure Active Problems:  Diarrhea  Nausea and vomiting  Ileus   Brief Admitting History and Physical 61 year old male with h/o bladder cancer and prostate cancer s/p prostatectomy with ileal conduit and urostomy in place, comes in for one week of nausea,vomiting and diarrhea, associated with mild abdominal pain. Diarrhea is watery. No hematemesis . He denies any travel, or use of antibiotics recently. He underwent a CT Abdomen showing bilateral renal stones and possible mild ileus. The urinary diversion with the ileal conduit is poorly seen in the CT. He was also found to be hypotensive and in acute renal failure. Renal consult was obtained from Dr Caryn Section, recommended hydration and to call Dr Hyman Hopes in am.    Discharge Medications Medication List  As of 11/22/2011 12:47 PM   STOP taking these medications         cloNIDine 0.1 MG tablet      doxazosin 4 MG tablet      lisinopril 40 MG tablet         TAKE these medications         amLODipine 10 MG tablet   Commonly known as: NORVASC   Take 10 mg by mouth every morning.      levofloxacin 250 MG tablet   Commonly known as: LEVAQUIN   Take 1 tablet (250 mg total) by mouth daily.      omeprazole 20 MG capsule   Commonly known as: PRILOSEC   Take 20 mg by mouth daily.      pravastatin 40 MG tablet   Commonly known as: PRAVACHOL   Take 40 mg by mouth at bedtime.            Hospital Course: 1. Acute on chronic renal failure. Likely multifactorial including volume depletion in the setting of nausea/vomiting/diarrhea and ongoing ACEi use. Improving renal function with IVF baseline about 1.4, improved off IVF  2.  Nausea/Vomiting: Symptoms improved, ? Ileus- eating a regular diet and no pain, + BM  3. Hypotension:  Resolved restarted norvasc- hold ACE til Cr back < 2  4. Metabolic acidosis with anion [id number] due to starvation and acute renal failure. Corrected with IVF  -5. Anemia: stable no sign of bleeding  6. UTI/enterobacter: Urine culture is noted for gram neg. Rods with >100.000 colonies. Treat with levaquin  7. Diarrhea: resolved.   8. nonobstructive 4 mm right UPJ calculus- outpatient referral to urology if not passed or has complications      Day of Discharge BP 114/80  Pulse 87  Temp(Src) 97.8 F (36.6 C) (Oral)  Resp 18  Ht 5' 8.11" (1.73 m)  Wt 73.1 kg (161 lb 2.5 oz)  BMI 24.42 kg/m2  SpO2 100%  Results for orders placed during the hospital encounter of 11/18/11 (from the past 48 hour(s))  RENAL FUNCTION PANEL     Status: Abnormal   Collection Time   11/21/11  5:15 AM      Component Value Range Comment   Sodium 142  135 - 145 (mEq/L)    Potassium 4.8  3.5 - 5.1 (mEq/L) HEMOLYSIS AT THIS LEVEL MAY AFFECT RESULT   Chloride 107  96 - 112 (mEq/L)    CO2 19  19 -  32 (mEq/L)    Glucose, Bld 96  70 - 99 (mg/dL)    BUN 61 (*) 6 - 23 (mg/dL)    Creatinine, Ser 6.57 (*) 0.50 - 1.35 (mg/dL) DELTA CHECK NOTED   Calcium 9.2  8.4 - 10.5 (mg/dL)    Phosphorus 3.8  2.3 - 4.6 (mg/dL)    Albumin 3.4 (*) 3.5 - 5.2 (g/dL)    GFR calc non Af Amer 10 (*) >90 (mL/min)    GFR calc Af Amer 12 (*) >90 (mL/min)   CBC     Status: Abnormal   Collection Time   11/21/11  5:15 AM      Component Value Range Comment   WBC 6.5  4.0 - 10.5 (K/uL)    RBC 4.16 (*) 4.22 - 5.81 (MIL/uL)    Hemoglobin 12.3 (*) 13.0 - 17.0 (g/dL) DELTA CHECK NOTED   HCT 35.3 (*) 39.0 - 52.0 (%)    MCV 84.9  78.0 - 100.0 (fL)    MCH 29.6  26.0 - 34.0 (pg)    MCHC 34.8  30.0 - 36.0 (g/dL)    RDW 84.6  96.2 - 95.2 (%)    Platelets 243  150 - 400 (K/uL)   CBC     Status: Abnormal   Collection Time   11/22/11  8:00  AM      Component Value Range Comment   WBC 5.8  4.0 - 10.5 (K/uL)    RBC 3.70 (*) 4.22 - 5.81 (MIL/uL)    Hemoglobin 10.9 (*) 13.0 - 17.0 (g/dL)    HCT 84.1 (*) 32.4 - 52.0 (%)    MCV 87.3  78.0 - 100.0 (fL)    MCH 29.5  26.0 - 34.0 (pg)    MCHC 33.7  30.0 - 36.0 (g/dL)    RDW 40.1  02.7 - 25.3 (%)    Platelets 257  150 - 400 (K/uL)   BASIC METABOLIC PANEL     Status: Abnormal   Collection Time   11/22/11  8:00 AM      Component Value Range Comment   Sodium 145  135 - 145 (mEq/L)    Potassium 4.1  3.5 - 5.1 (mEq/L)    Chloride 111  96 - 112 (mEq/L)    CO2 22  19 - 32 (mEq/L)    Glucose, Bld 137 (*) 70 - 99 (mg/dL)    BUN 49 (*) 6 - 23 (mg/dL)    Creatinine, Ser 6.64 (*) 0.50 - 1.35 (mg/dL)    Calcium 8.3 (*) 8.4 - 10.5 (mg/dL)    GFR calc non Af Amer 15 (*) >90 (mL/min)    GFR calc Af Amer 18 (*) >90 (mL/min)     Ct Abdomen Pelvis Wo Contrast  11/18/2011  *RADIOLOGY REPORT*  Clinical Data: Abdominal pain, vomiting, diarrhea.  Hypotension. History of bladder cancer.  Recent colostomy.  CT ABDOMEN AND PELVIS WITHOUT CONTRAST  Technique:  Multidetector CT imaging of the abdomen and pelvis was performed following the standard protocol without intravenous contrast.  Comparison: 09/09/2011; 10/07/2011  Findings: Subsegmental atelectasis noted in both lower lobes. Scattered small hepatic hypodensities probably represent cysts but are technically nonspecific on today's noncontrast CT scan.  The spleen and adrenal glands appear normal.  Bilateral nephrolithiasis is present, and there is a 4 mm calculus at the right UPJ without obstruction.  There is fullness of the left proximal ureter, with poor visualization of the distal ureter.  The ileal conduit is indistinct, as is its attachment to the  ostomy site.  Orally administered contrast extends through loops of small bowel and to the level of the rectum.  There are air-fluid levels and scattered pelvic loops of small bowel, which are borderline  dilated.  The colon does not appear dilated.  No pathologic adenopathy is currently observed.  Prior cystoprostatectomy noted stable sclerotic marginated lesion in the left iliac bone noted, not significantly changed from 03/18/2011.  IMPRESSION:  1.  Bilateral nephrolithiasis.  There is an apparently nonobstructive 4 mm right UPJ calculus. 2.  Scattered air-fluid levels and borderline dilated loops of small bowel.  However, orally administered contrast makes its way through to the rectum.  Query mild ileus. 3.  The distal ureters and their attachment to the ileal conduit are poorly seen, and better visualization would probably require delayed phase postcontrast imaging.  No definite hydroureter to suggest a urinary obstruction.  No abnormal fluid collection is observed around the ostomy site, and no peristomal hernia is noted. 4.  Hypodense hepatic lesions are similar to prior MRI and probably represent cysts.  Original Report Authenticated By: Dellia Cloud, M.D.   Dg Abd 1 View  11/19/2011  *RADIOLOGY REPORT*  Clinical Data: Evaluate for ileus.  Nausea and hiccups.  History of colon surgery for cancer.  ABDOMEN - 1 VIEW  Comparison: CT abdomen pelvis 11/18/2011  Findings: Oral contrast from yesterday's CT scan has advanced into the colon.  Several diverticula associated with the descending colon are noted.  Gas is seen scattered throughout multiple loops of small bowel and colon.  Some of the small bowel loops are borderline prominent caliber and the left upper quadrant and central abdomen.  Gas is seen to the rectum.  No acute or suspicious bony abnormality.  IMPRESSION:  1.  No evidence of bowel obstruction.  Oral contrast has progressed into the colon from yesterday's CT. 2. Gas throughout the bowel loops, with borderline prominent small bowel loops in the left upper quadrant and central abdomen. No significant change in bowel gas pattern compared to the abdomen pelvis CT of 11/18/2011.  The bowel  gas pattern is nonspecific, but could be seen in the setting of gastroenteritis or mild ileus.  Original Report Authenticated By: Britta Mccreedy, M.D.   US Renal  11/19/2011  *RADIOLOGY REPORT*  Clinical Data:  Acute renal failure  RENAL/URINARY TRACT ULTRASOUND COMPLETE  Comparison:  11/18/2011  Findings:  Right Kidney:  The right kidney is diffusely echogenic measuring 12.1 cm. Normal in size.  No mass or hydronephrosis.  Left Kidney:  The left kidney measures 11 cm.  The left kidney is diffusely echogenic.Normal in size and parenchymal echogenicity. No evidence of mass or hydronephrosis. Stone is identified within the inferior pole of the left kidney measuring approximately 6 mm.  Bladder:  Appears normal for degree of bladder distention.  IMPRESSION:  1.  Bilateral echogenic kidneys compatible with medical renal disease. 2.  No evidence for obstructive uropathy.  Original Report Authenticated By: Rosealee Albee, M.D.     Disposition: home  Diet: cardiac, drink lots of fluid (water)  Activity: as tolerated  Follow-up Appts: PCP in one week  Discharge Orders    Future Orders Please Complete By Expires   Diet - low sodium heart healthy      Increase activity slowly      Discharge instructions      Comments:   Drink plenty of fluid       Time spent on discharge, talking to the patient, and coordinating care:  45 mins.   SignedMarlin Canary, DO 11/22/2011, 12:47 PM

## 2011-11-26 NOTE — Progress Notes (Signed)
agree with above

## 2011-12-09 ENCOUNTER — Emergency Department (HOSPITAL_COMMUNITY): Payer: Medicare Other

## 2011-12-09 ENCOUNTER — Encounter (HOSPITAL_COMMUNITY): Payer: Self-pay | Admitting: Emergency Medicine

## 2011-12-09 ENCOUNTER — Inpatient Hospital Stay (HOSPITAL_COMMUNITY)
Admission: EM | Admit: 2011-12-09 | Discharge: 2011-12-14 | DRG: 389 | Disposition: A | Discharge status: Home / Self Care | Payer: Medicare Other | Attending: Internal Medicine | Admitting: Internal Medicine

## 2011-12-09 DIAGNOSIS — Z8673 Personal history of transient ischemic attack (TIA), and cerebral infarction without residual deficits: Secondary | ICD-10-CM

## 2011-12-09 DIAGNOSIS — Z436 Encounter for attention to other artificial openings of urinary tract: Secondary | ICD-10-CM

## 2011-12-09 DIAGNOSIS — Z906 Acquired absence of other parts of urinary tract: Secondary | ICD-10-CM

## 2011-12-09 DIAGNOSIS — E785 Hyperlipidemia, unspecified: Secondary | ICD-10-CM | POA: Diagnosis present

## 2011-12-09 DIAGNOSIS — E870 Hyperosmolality and hypernatremia: Secondary | ICD-10-CM | POA: Diagnosis not present

## 2011-12-09 DIAGNOSIS — K56609 Unspecified intestinal obstruction, unspecified as to partial versus complete obstruction: Principal | ICD-10-CM | POA: Diagnosis present

## 2011-12-09 DIAGNOSIS — E860 Dehydration: Secondary | ICD-10-CM | POA: Diagnosis present

## 2011-12-09 DIAGNOSIS — R5383 Other fatigue: Secondary | ICD-10-CM | POA: Diagnosis present

## 2011-12-09 DIAGNOSIS — R5381 Other malaise: Secondary | ICD-10-CM | POA: Diagnosis present

## 2011-12-09 DIAGNOSIS — I10 Essential (primary) hypertension: Secondary | ICD-10-CM | POA: Diagnosis present

## 2011-12-09 DIAGNOSIS — K219 Gastro-esophageal reflux disease without esophagitis: Secondary | ICD-10-CM | POA: Diagnosis present

## 2011-12-09 DIAGNOSIS — I129 Hypertensive chronic kidney disease with stage 1 through stage 4 chronic kidney disease, or unspecified chronic kidney disease: Secondary | ICD-10-CM | POA: Diagnosis present

## 2011-12-09 DIAGNOSIS — N183 Chronic kidney disease, stage 3 unspecified: Secondary | ICD-10-CM | POA: Diagnosis present

## 2011-12-09 DIAGNOSIS — C679 Malignant neoplasm of bladder, unspecified: Secondary | ICD-10-CM | POA: Diagnosis present

## 2011-12-09 DIAGNOSIS — I69998 Other sequelae following unspecified cerebrovascular disease: Secondary | ICD-10-CM

## 2011-12-09 DIAGNOSIS — K562 Volvulus: Secondary | ICD-10-CM | POA: Diagnosis present

## 2011-12-09 DIAGNOSIS — N179 Acute kidney failure, unspecified: Secondary | ICD-10-CM | POA: Diagnosis present

## 2011-12-09 LAB — COMPREHENSIVE METABOLIC PANEL
ALT: 14 U/L (ref 0–53)
AST: 14 U/L (ref 0–37)
Alkaline Phosphatase: 76 U/L (ref 39–117)
CO2: 24 mEq/L (ref 19–32)
GFR calc Af Amer: 43 mL/min — ABNORMAL LOW (ref >90)
GFR calc non Af Amer: 37 mL/min — ABNORMAL LOW (ref >90)
Glucose, Bld: 190 mg/dL — ABNORMAL HIGH (ref 70–99)
Potassium: 3.6 mEq/L (ref 3.5–5.1)
Sodium: 146 mEq/L — ABNORMAL HIGH (ref 135–145)
Total Protein: 8 g/dL (ref 6.0–8.3)

## 2011-12-09 LAB — URINALYSIS, ROUTINE W REFLEX MICROSCOPIC
Bilirubin Urine: NEGATIVE
Ketones, ur: NEGATIVE mg/dL
Nitrite: NEGATIVE
Specific Gravity, Urine: 1.015 (ref 1.005–1.030)
pH: 7 (ref 5.0–8.0)

## 2011-12-09 LAB — URINE MICROSCOPIC-ADD ON

## 2011-12-09 LAB — DIFFERENTIAL
Basophils Absolute: 0 10*3/uL (ref 0.0–0.1)
Neutro Abs: 6.7 10*3/uL (ref 1.7–7.7)

## 2011-12-09 LAB — CBC
HCT: 40 % (ref 39.0–52.0)
Hemoglobin: 13.8 g/dL (ref 13.0–17.0)
MCH: 29.6 pg (ref 26.0–34.0)
MCHC: 34.5 g/dL (ref 30.0–36.0)
MCV: 85.8 fL (ref 78.0–100.0)

## 2011-12-09 MED ORDER — MORPHINE SULFATE 4 MG/ML IJ SOLN
4.0000 mg | Freq: Once | INTRAMUSCULAR | Status: AC
  Administered 2011-12-09: 4 mg via INTRAVENOUS
  Filled 2011-12-09: qty 1

## 2011-12-09 MED ORDER — ONDANSETRON HCL 4 MG/2ML IJ SOLN
4.0000 mg | Freq: Once | INTRAMUSCULAR | Status: AC
  Administered 2011-12-09: 4 mg via INTRAVENOUS
  Filled 2011-12-09: qty 2

## 2011-12-09 MED ORDER — ONDANSETRON HCL 4 MG/2ML IJ SOLN
4.0000 mg | Freq: Four times a day (QID) | INTRAMUSCULAR | Status: DC | PRN
  Administered 2011-12-09: 4 mg via INTRAVENOUS
  Filled 2011-12-09: qty 2

## 2011-12-09 MED ORDER — SODIUM CHLORIDE 0.9 % IV BOLUS (SEPSIS)
1000.0000 mL | Freq: Once | INTRAVENOUS | Status: AC
  Administered 2011-12-09: 1000 mL via INTRAVENOUS

## 2011-12-09 MED ORDER — IOHEXOL 300 MG/ML  SOLN
20.0000 mL | INTRAMUSCULAR | Status: AC
  Administered 2011-12-09: 20 mL via ORAL

## 2011-12-09 MED ORDER — MORPHINE SULFATE 4 MG/ML IJ SOLN
4.0000 mg | INTRAMUSCULAR | Status: DC | PRN
  Administered 2011-12-09 (×4): 4 mg via INTRAVENOUS
  Filled 2011-12-09 (×5): qty 1

## 2011-12-09 MED ORDER — KCL IN DEXTROSE-NACL 40-5-0.45 MEQ/L-%-% IV SOLN
INTRAVENOUS | Status: DC
  Administered 2011-12-09: 14:00:00 via INTRAVENOUS
  Administered 2011-12-10: 50 mL/h via INTRAVENOUS
  Filled 2011-12-09 (×6): qty 1000

## 2011-12-09 MED ORDER — METOPROLOL TARTRATE 1 MG/ML IV SOLN
5.0000 mg | Freq: Four times a day (QID) | INTRAVENOUS | Status: DC
  Administered 2011-12-09 – 2011-12-12 (×11): 5 mg via INTRAVENOUS
  Filled 2011-12-09 (×18): qty 5

## 2011-12-09 MED ORDER — SODIUM CHLORIDE 0.9 % IV SOLN
INTRAVENOUS | Status: AC
  Administered 2011-12-09: 125 mL/h via INTRAVENOUS

## 2011-12-09 MED ORDER — LIDOCAINE VISCOUS 2 % MT SOLN
OROMUCOSAL | Status: AC
  Administered 2011-12-09: 09:00:00
  Filled 2011-12-09: qty 15

## 2011-12-09 MED ORDER — HYDRALAZINE HCL 20 MG/ML IJ SOLN
10.0000 mg | Freq: Four times a day (QID) | INTRAMUSCULAR | Status: DC
  Administered 2011-12-09 – 2011-12-10 (×5): 10 mg via INTRAVENOUS
  Filled 2011-12-09: qty 1
  Filled 2011-12-09 (×2): qty 0.5
  Filled 2011-12-09: qty 1
  Filled 2011-12-09 (×3): qty 0.5
  Filled 2011-12-09: qty 1
  Filled 2011-12-09 (×3): qty 0.5

## 2011-12-09 MED ORDER — CLONIDINE HCL 0.2 MG/24HR TD PTWK: 0.2000 mg | MEDICATED_PATCH | TRANSDERMAL | Status: DC

## 2011-12-09 MED ORDER — METOPROLOL TARTRATE 1 MG/ML IV SOLN
INTRAVENOUS | Status: AC
  Filled 2011-12-09: qty 5

## 2011-12-09 MED ORDER — PANTOPRAZOLE SODIUM 40 MG IV SOLR
40.0000 mg | INTRAVENOUS | Status: DC
  Administered 2011-12-09 – 2011-12-12 (×4): 40 mg via INTRAVENOUS
  Filled 2011-12-09 (×5): qty 40

## 2011-12-09 MED ORDER — METOPROLOL TARTRATE 1 MG/ML IV SOLN: 5.0000 mg | Freq: Four times a day (QID) | INTRAVENOUS | Status: DC

## 2011-12-09 NOTE — ED Notes (Signed)
Report to [facility] on [address] at [facility]

## 2011-12-09 NOTE — ED Notes (Addendum)
MD at bedside.  Urology at the bedside  Dr Margarita Grizzle

## 2011-12-09 NOTE — ED Notes (Signed)
Pt drinking oral contrast.

## 2011-12-09 NOTE — Progress Notes (Signed)
Pt's bp was high this afternoon at 164/135. MD was notified. New orders were placed for hydralazine, a clonidine patch, and to KVO fluids. Orders were placed, and passed on to night RN.

## 2011-12-09 NOTE — ED Notes (Signed)
Pt alert c/o abd pain since his cancer surgery in feb, worse for the past few days

## 2011-12-09 NOTE — ED Notes (Signed)
The pt has no use of his lt arm residual form an old stroke.  He also has bk amp  Bi-laterally

## 2011-12-09 NOTE — Consult Note (Signed)
Urology Consult  CC: Abdominal pain  HPI: 61 year old male with a history of bladder cancer who underwent bladder and prostate resection with creation of an ileal conduit for urinary diversion in February of 2013. The patient was admitted in March with nausea and abdominal pain and acute renal failure that could have been caused by the stone he was passing from his right kidney. He presented to the ER overnight with acute abdominal pain. It began the afternoon of April 7. It was sharp in nature. It was located in his right upper quadrant. It radiated to his epigastric and suprapubic region. It was associated with nausea and emesis. He continues to pass flatus and have bowel movements on a regular basis. He states he has had no change in his bowel activity. Last bowel movement was within the past 12 hours. The pain is improved with narcotics.  CT scan while in the ER shows a small type pattern in his right mid abdomen concerning for volvulus. There is some dilation of the proximal small bowel, but there is air and contrast beyond this area. The bowel anastomosis looks patent. I discussed with the patient that if this was a volvulus that it could be cut off the blood supply to his bowel and result in the need for further surgery, bowel resection, or even death. We discussed management options which include exploratory laparotomy versus conservative management with n.p.o. status and an NG tube. The patient wishes to avoid surgery at all cost at this point. We discussed the risk and benefits of each of these options.  PMH: Past Medical History  Diagnosis Date  . Hypertension 08-20-11  . Dysrhythmia 08-20-11    skip beat, hx. RBBB  . GERD (gastroesophageal reflux disease) 08-20-11    controls with diet, hx. colon polyps(benign)  . Cancer 08-20-11    bladder cancer  . Arthritis   . Stroke 08-20-11    '08-Lt. sided weakness with residual > lt. arm.  . Chronic kidney disease     CKD stage III     PSH: Past Surgical History  Procedure Date  . Bladder surgery 08-20-11    07-01-11 bladder surgery, past hx. lithotripsy  . Shoulder arthrotomy 08-20-11    left shoulder with retained pins  . Knee arthroscopy 08-20-11    bil. knee scopes-torn ligament  . Cystoscopy w/ ureteral stent placement 08/21/2011    Procedure: CYSTOSCOPY WITH RETROGRADE PYELOGRAM/URETERAL STENT PLACEMENT;  Surgeon: Milford Cage, MD;  Location: [facility];  Service: Urology;  Laterality: Left;  . Cystoscopy with biopsy 08/21/2011    Procedure: CYSTOSCOPY WITH BIOPSY;  Surgeon: Milford Cage, MD;  Location: [facility];  Service: Urology;  Laterality: N/A;  . Transurethral resection of bladder tumor 08/21/2011    Procedure: TRANSURETHRAL RESECTION OF BLADDER TUMOR (TURBT);  Surgeon: Milford Cage, MD;  Location: [facility];  Service: Urology;  Laterality: N/A;  need PK gyrus  . Cystectomy 10/07/2011    Procedure: CYSTECTOMY COMPLETE;  Surgeon: Milford Cage, MD;  Location: [facility];  Service: Urology;  Laterality: Bilateral;  Radical Cystoprostatectomy Bilateral Pelvic Lymph Node Dissection Formation Of Ileal Conduit   . Prostatectomy 10/07/2011    Procedure: PROSTATECTOMY;  Surgeon: Milford Cage, MD;  Location: [facility];  Service: Urology;  Laterality: N/A;  . Lymph node dissection 10/07/2011    Procedure: LYMPH NODE DISSECTION;  Surgeon: Milford Cage, MD;  Location: [facility];  Service: Urology;  Laterality: Bilateral;    Allergies: Allergies  Allergen Reactions  . Penicillins Other (See Comments)    HAD WHEN WAS A CHILD   . Pregabalin Swelling    Medications:  (Not in a hospital admission)   Social History: History   Social History  . Marital Status: Single    Spouse Name: N/A    Number of Children: N/A  . Years of Education: N/A   Occupational History  . disability    Social History Main Topics  . Smoking status: Former Smoker -- 1.0 packs/day for 40 years     Types: Cigarettes    Quit date: 10/28/2011  . Smokeless tobacco: Not on file  . Alcohol Use: No  . Drug Use: 3 per week    Special: Marijuana     ocassionally  . Sexually Active: Yes   Other Topics Concern  . Not on file   Social History Narrative   Lives alone    Family History: No family history on file.  Review of Systems: Positive: Nausea, emesis. Negative: Fever, chest pain, SOB.  A further 10 point review of systems was negative except what is listed in the HPI.  Physical Exam:  General: Mild distress.  Awake. Head:  Normocephalic.  Atraumatic. ENT:  EOMI.  Mucous membranes moist Neck:  Supple.  No lymphadenopathy. CV:  S1 present. S2 present. Mild tachycardia. Pulmonary: Equal effort bilaterally.  Clear to auscultation bilaterally. Abdomen: Soft. No distention compared to his normal state. Mildly tender to palpation in the RUQ. No rebound TTP. Urostomy pink and patent draining yellow urine. Skin:  Normal turgor.  No visible rash. Extremity: No gross deformity of right upper extremity. Contracture of the left upper extremity. Neurologic: Alert. Appropriate mood. Left hemiparesis.  Penis:  Uncircumcised.  No lesions.  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  Plan: -Admit to hospital service. -Place NG tube and set to low intermittent wall suction. -NPO -Appreciate General Surgery input and ask them to follow along. If exploratory laparotomy becomes indicated I would appreciate their help with this. -Patient will be transported to [place] on hospital to allow me to monitor his status more  regularly.   Pager: [phone]

## 2011-12-09 NOTE — ED Notes (Signed)
Notified EDP about pts elevated BP. No new orders given at this time. Pt requested something drinking. EDP notifying pt that he has to wait on fluids. Pt made aware that he will be admitted. Will continue to monitor.

## 2011-12-09 NOTE — ED Notes (Signed)
report to [employer]

## 2011-12-09 NOTE — Progress Notes (Signed)
GU  Patient has abdominal pain. It is stable from this morning; it is not worse. It is improved with narcotics. He has had flatus since I saw him this morning; no BM since today.  Filed Vitals:   12/09/11 1500  BP: 157/122  Pulse: 104  Temp: 97.9 F (36.6 C)  Resp: 18   Gen: NAD Abd: Soft, non-distended (he is at his baseline), mild TTP supraumbilical, no rebound TTP.  GU: Urostomy pink with drainage of yellow urine.  A/P: SBO with possible volvulus. -Continue NG tube. Xray in morning. -Note about his renal function; his baseline Cr is 1.7-1.8. -HTN: he is getting IV metoprolol per internal medicine.

## 2011-12-09 NOTE — Progress Notes (Signed)
Patient seen after arrival to the floor. Initially doing well, but his blood pressure has slowly trended up over the course of the day. Started with IV Lopressor 5 mg every 6 hours. In addition, his blood pressures continued to climb. At [facility] his IV fluids and started him on IV hydralazine plus clonidine patch. In discussion with nursing, he is actually not in severe pain or anxiety.

## 2011-12-09 NOTE — ED Notes (Signed)
Contrast completed by pt. CT made aware. Will continue to monitor.

## 2011-12-09 NOTE — ED Provider Notes (Signed)
History     CSN: [id number]  Arrival date & time 12/09/11  0004   First MD Initiated Contact with Patient 12/09/11 0034      Chief Complaint  Patient presents with  . Abdominal Pain  . Emesis    (Consider location/radiation/quality/duration/timing/severity/associated sxs/prior treatment) Patient is a 61 y.o. male presenting with abdominal pain and vomiting. The history is provided by the patient.  Abdominal Pain The primary symptoms of the illness include abdominal pain, nausea and vomiting. The primary symptoms of the illness do not include fever or diarrhea. The current episode started 13 to 24 hours ago. The onset of the illness was gradual. The problem has not changed since onset. The abdominal pain is located in the RLQ. The abdominal pain does not radiate. The severity of the abdominal pain is 6/10. The abdominal pain is relieved by nothing. The abdominal pain is exacerbated by eating.  The vomiting began today. Vomiting occurs 2 to 5 times per day. The emesis contains stomach contents.  Associated with: No alcohol or NSAID use. Risk factors for an acute abdominal problem include being elderly. Additional symptoms associated with the illness include chills and anorexia. Symptoms associated with the illness do not include constipation or back pain. Significant associated medical issues do not include diabetes or gallstones. Associated medical issues comments: Bladder cancer recent surgery in February for bladder and prostate removal.  Emesis  Associated symptoms include abdominal pain and chills. Pertinent negatives include no diarrhea and no fever.    Past Medical History  Diagnosis Date  . Hypertension 08-20-11  . Dysrhythmia 08-20-11    skip beat, hx. RBBB  . GERD (gastroesophageal reflux disease) 08-20-11    controls with diet, hx. colon polyps(benign)  . Cancer 08-20-11    bladder cancer  . Arthritis   . Stroke 08-20-11    '08-Lt. sided weakness with residual > lt. arm.    . Chronic kidney disease     CKD stage III    Past Surgical History  Procedure Date  . Bladder surgery 08-20-11    07-01-11 bladder surgery, past hx. lithotripsy  . Shoulder arthrotomy 08-20-11    left shoulder with retained pins  . Knee arthroscopy 08-20-11    bil. knee scopes-torn ligament  . Cystoscopy w/ ureteral stent placement 08/21/2011    Procedure: CYSTOSCOPY WITH RETROGRADE PYELOGRAM/URETERAL STENT PLACEMENT;  Surgeon: Milford Cage, MD;  Location: [facility];  Service: Urology;  Laterality: Left;  . Cystoscopy with biopsy 08/21/2011    Procedure: CYSTOSCOPY WITH BIOPSY;  Surgeon: Milford Cage, MD;  Location: [facility];  Service: Urology;  Laterality: N/A;  . Transurethral resection of bladder tumor 08/21/2011    Procedure: TRANSURETHRAL RESECTION OF BLADDER TUMOR (TURBT);  Surgeon: Milford Cage, MD;  Location: [facility];  Service: Urology;  Laterality: N/A;  need PK gyrus  . Cystectomy 10/07/2011    Procedure: CYSTECTOMY COMPLETE;  Surgeon: Milford Cage, MD;  Location: [facility];  Service: Urology;  Laterality: Bilateral;  Radical Cystoprostatectomy Bilateral Pelvic Lymph Node Dissection Formation Of Ileal Conduit   . Prostatectomy 10/07/2011    Procedure: PROSTATECTOMY;  Surgeon: Milford Cage, MD;  Location: [facility];  Service: Urology;  Laterality: N/A;  . Lymph node dissection 10/07/2011    Procedure: LYMPH NODE DISSECTION;  Surgeon: Milford Cage, MD;  Location: [facility];  Service: Urology;  Laterality: Bilateral;    No family history on file.  History  Substance Use Topics  . Smoking status: Former Smoker --  1.0 packs/day for 40 years    Types: Cigarettes    Quit date: 10/28/2011  . Smokeless tobacco: Not on file  . Alcohol Use: No      Review of Systems  Constitutional: Positive for chills. Negative for fever.  Gastrointestinal: Positive for nausea, vomiting, abdominal pain and anorexia. Negative for diarrhea and constipation.   Musculoskeletal: Negative for back pain.  All other systems reviewed and are negative.    Allergies  Penicillins and Pregabalin  Home Medications   Current Outpatient Rx  Name Route Sig Dispense Refill  . AMLODIPINE BESYLATE 10 MG PO TABS Oral Take 10 mg by mouth every morning.     Marland Kitchen OMEPRAZOLE 20 MG PO CPDR Oral Take 20 mg by mouth daily.    Marland Kitchen PRAVASTATIN SODIUM 40 MG PO TABS Oral Take 40 mg by mouth at bedtime.       BP 156/117  Pulse 119  Temp(Src) 98.1 F (36.7 C) (Oral)  Resp 21  SpO2 100%  Physical Exam  Nursing note and vitals reviewed. Constitutional: He is oriented to person, place, and time. He appears well-developed and well-nourished. No distress.  HENT:  Head: Normocephalic and atraumatic.  Mouth/Throat: Oropharynx is clear and moist. Mucous membranes are dry.  Eyes: Conjunctivae and EOM are normal. Pupils are equal, round, and reactive to light.  Neck: Normal range of motion. Neck supple.  Cardiovascular: Regular rhythm and intact distal pulses.  Tachycardia present.   No murmur heard. Pulmonary/Chest: Effort normal and breath sounds normal. No respiratory distress. He has no wheezes. He has no rales.  Abdominal: Soft. He exhibits no distension. There is tenderness in the right lower quadrant and epigastric area. There is no rebound and no guarding.         With palpation in the epigastric region the patient states that it is sore.  Well-healed midline surgical scar  Musculoskeletal: Normal range of motion. He exhibits no edema and no tenderness.  Neurological: He is alert and oriented to person, place, and time.  Skin: Skin is warm and dry. No rash noted. No erythema.  Psychiatric: He has a normal mood and affect. His behavior is normal.    ED Course  Procedures (including critical care time)  Labs Reviewed  DIFFERENTIAL - Abnormal; Notable for the following:    Neutrophils Relative 85 (*)    All other components within normal limits  COMPREHENSIVE  METABOLIC PANEL - Abnormal; Notable for the following:    Sodium 146 (*)    Glucose, Bld 190 (*)    Creatinine, Ser 1.89 (*)    GFR calc non Af Amer 37 (*)    GFR calc Af Amer 43 (*)    All other components within normal limits  URINALYSIS, ROUTINE W REFLEX MICROSCOPIC - Abnormal; Notable for the following:    APPearance CLOUDY (*)    Hgb urine dipstick TRACE (*)    Protein, ur >300 (*)    Leukocytes, UA SMALL (*)    All other components within normal limits  URINE MICROSCOPIC-ADD ON - Abnormal; Notable for the following:    Bacteria, UA FEW (*)    All other components within normal limits  CBC  LACTIC ACID, PLASMA   Ct Abdomen Pelvis Wo Contrast  12/09/2011  *RADIOLOGY REPORT*  Clinical Data: Lower abdominal pain.  CT ABDOMEN AND PELVIS WITHOUT CONTRAST  Technique:  Multidetector CT imaging of the abdomen and pelvis was performed following the standard protocol without intravenous contrast.  Comparison: CT of the  abdomen and pelvis performed 11/18/2011, and renal ultrasound performed 11/19/2011  Findings: The visualized lung bases are clear.  Scattered hypodensities are noted within the liver, seen to reflect small cysts on the prior MRI.  Known renal cysts are less well characterized.  The liver is otherwise unremarkable in appearance. The spleen is within normal limits.  The gallbladder is unremarkable.  The pancreas and adrenal glands are within normal limits.  A 5 mm stone is again noted near the lower pole of the left kidney. There is mild left-sided renal scarring.  The right kidney is unremarkable in appearance.  There is no evidence of hydronephrosis.  No obstructing ureteral stones are seen.  No perinephric stranding is appreciated.  There is an apparent partial volvulus at the right mid abdomen, with a whirl sign in the mesentery and intermittent decompression of small bowel loops in the periphery of the volvulus.  There is distension of small bowel loops up to 3.4 cm in maximal  diameter. This raises concern for partial obstruction due to the volvulus. No definite closed loop obstruction is identified.  The anastomosis at the right lower quadrant is grossly unremarkable in appearance.  The appendix remains normal in caliber, though difficult to fully assess due to surrounding soft tissue stranding. The appearance of soft tissue inflammation at the right lower quadrant is relatively stable, with slightly more free fluid now seen.  The colon is unremarkable in appearance.  Evaluation of the colon is suboptimal due to motion artifact.  The ileal conduit is not well assessed; the lack of hydronephrosis suggests against obstruction.  The remaining prostate remains grossly normal in size, with associated postoperative change.  No inguinal lymphadenopathy is seen.  No acute osseous abnormalities are identified.  A sclerotic focus is again noted at the left iliac bone, adjacent to the left sacroiliac joint.  IMPRESSION:  1.  Apparent partial small bowel volvulus at the right mid abdomen, with a whirl sign in the mesentery and intermittent decompression of small bowel loops in the periphery of the volvulus.  Distension of small bowel loops to 3.4 cm in maximal diameter.  This raises concern for partial obstruction due to the volvulus.  No high-grade small bowel obstruction seen. 2.  Slightly more free fluid now noted at the right lower quadrant, though the degree of soft tissue inflammation at the right lower quadrant appears relatively stable.  This is noted adjacent to the appendix, though it does not appear centered at the appendix. 3.  No evidence of hydronephrosis; mild left-sided renal scarring and 5 mm left renal stone again noted. 4.  Scattered renal and hepatic cysts seen. 5.  Anastomosis at the right lower quadrant is grossly unremarkable in appearance. 6.  Ileal conduit not well assessed.  Original Report Authenticated By: Tonia Ghent, M.D.     1. Volvulus of intestine        MDM   Patient with a prior history of bladder removal do to tumor and prostatectomy done at the end of February. He has an ileostomy which is draining urine. Today he developed nausea, vomiting and chills. He is having some lower abdominal pain below his ileostomy bag. She denies any shortness of breath, chest pain or fever. On arrival here he was tachycardic to 140 with a normal blood pressure and O2 sat. On exam now patient is more calm his heart rate is persistently in the 120s. He is having mild right lower quadrant pain without rebound or guarding. He has yellow urine  in his bag and he is mildly dehydrated. Patient given IV fluid, pain and nausea medication. Concern for possible UTI versus appendicitis versus complication from recent surgery. CBC, CMP, UA, lactic acid pending.  2:29 AM Improvement in patient's creatinine now 1.89 from 3. Normal hemoglobin and white cell count today. Lactate upper limits of normal 2. UA is still pending. However given the pain on his exam today will do a noncontrasted scan with oral contrast to further evaluate.  3:52 AM UA does not appear to be infected. Urine culture sent. On reevaluation patient streaking is contrast in states the nausea and pain is improved.  5:50 AM CT findings with partial small bowel volvulus. Spoke with Gen. surgery about this and they state since he recently had surgery by urology I should speak with them because they will probably want to manage his care. Will consult urology.  Gen surg and urology will consult on pt.  Admitted to [facility]  Gwyneth Sprout, MD 12/09/11 [phone]

## 2011-12-09 NOTE — ED Notes (Signed)
Pt stated that he has been having abdominal pain since February. He had CA surgery during that time. Pt stated that for the last 2-4 days pain has become worse. No n/v/d. No respiratory or cardiac distress. Will continue to monitor.

## 2011-12-09 NOTE — Consult Note (Signed)
William Collier [birth year]  [id number].   Primary Care MD: Oneal Grout, MD, MD  Requesting MD: Dr. Margarita Grizzle  Chief Complaint/Reason for Consult: abdominal pain, Partial SBO  HPI: 61 year old male with a hx of bladder cancer who underwent bladder and prostate resection with an ileal conduit in February 2013. Patient began having abdominal pain yesterday morning, located in mid-epigastrium, states that it radiated up and down. Also had several episodes of emesis, last was this morning prior to arrival. Anorexia since onset of pain. He had a normal BM yesterday, and denies changes in bowel habits. In the ED, urology was consulted to evaluate ileal conduit and CT scan obtained showing concern for partial SBO and volvulus. NG tube was placed to suction, which has returned nearly 1 L of yellowish/clear gastric fluid. Patient states his pain has much improved, rated 2/10 at time of interview after multiple morphine doses as well. General surgery asked to consult in case of need for operative evaluation.    Review of Systems - General ROS: negative for - chills or fever ENT ROS: negative for - visual changes + for hiccups Respiratory ROS: no cough, shortness of breath, or wheezing Gastrointestinal ROS: negative for - blood in stools, change in bowel habits, hematemesis or swallowing difficulty/pain Genito-Urinary ROS: negative for - pelvic pain  No family history on file.  Past Medical History  Diagnosis Date  . Hypertension 08-20-11  . Dysrhythmia 08-20-11    skip beat, hx. RBBB  . GERD (gastroesophageal reflux disease) 08-20-11    controls with diet, hx. colon polyps(benign)  . Cancer 08-20-11    bladder cancer  . Arthritis   . Stroke 08-20-11    '08-Lt. sided weakness with residual > lt. arm.  . Chronic kidney disease     CKD stage III    Past Surgical History  Procedure Date  . Bladder surgery 08-20-11    07-01-11 bladder surgery, past hx. lithotripsy  . Shoulder arthrotomy  08-20-11    left shoulder with retained pins  . Knee arthroscopy 08-20-11    bil. knee scopes-torn ligament  . Cystoscopy w/ ureteral stent placement 08/21/2011    Procedure: CYSTOSCOPY WITH RETROGRADE PYELOGRAM/URETERAL STENT PLACEMENT;  Surgeon: Milford Cage, MD;  Location: [facility];  Service: Urology;  Laterality: Left;  . Cystoscopy with biopsy 08/21/2011    Procedure: CYSTOSCOPY WITH BIOPSY;  Surgeon: Milford Cage, MD;  Location: [facility];  Service: Urology;  Laterality: N/A;  . Transurethral resection of bladder tumor 08/21/2011    Procedure: TRANSURETHRAL RESECTION OF BLADDER TUMOR (TURBT);  Surgeon: Milford Cage, MD;  Location: [facility];  Service: Urology;  Laterality: N/A;  need PK gyrus  . Cystectomy 10/07/2011    Procedure: CYSTECTOMY COMPLETE;  Surgeon: Milford Cage, MD;  Location: [facility];  Service: Urology;  Laterality: Bilateral;  Radical Cystoprostatectomy Bilateral Pelvic Lymph Node Dissection Formation Of Ileal Conduit   . Prostatectomy 10/07/2011    Procedure: PROSTATECTOMY;  Surgeon: Milford Cage, MD;  Location: [facility];  Service: Urology;  Laterality: N/A;  . Lymph node dissection 10/07/2011    Procedure: LYMPH NODE DISSECTION;  Surgeon: Milford Cage, MD;  Location: [facility];  Service: Urology;  Laterality: Bilateral;    Social History:  reports that he quit smoking about 6 weeks ago. His smoking use included Cigarettes. He has a 40 pack-year smoking history. He does not have any smokeless tobacco history on file. He reports that he uses illicit drugs (Marijuana) about 3  times per week. He reports that he does not drink alcohol.  Allergies:  Allergies  Allergen Reactions  . Penicillins Other (See Comments)    HAD WHEN WAS A CHILD   . Pregabalin Swelling    Medications Prior to Admission  Medication Dose Route Frequency Provider Last Rate Last Dose  . iohexol (OMNIPAQUE) 300 MG/ML solution 20 mL  20 mL Oral Q1 Hr x 2 Medication  Radiologist, MD   20 mL at 12/09/11 0742  . lidocaine (XYLOCAINE) 2 % viscous mouth solution           . morphine 4 MG/ML injection 4 mg  4 mg Intravenous Once Gwyneth Sprout, MD   4 mg at 12/09/11 0129  . morphine 4 MG/ML injection 4 mg  4 mg Intravenous Once Gwyneth Sprout, MD   4 mg at 12/09/11 0447  . morphine 4 MG/ML injection 4 mg  4 mg Intravenous Once Gwyneth Sprout, MD   4 mg at 12/09/11 0733  . morphine 4 MG/ML injection 4 mg  4 mg Intravenous Q1H PRN Sorin Luanne Bras, MD      . ondansetron (ZOFRAN) injection 4 mg  4 mg Intravenous Once Gwyneth Sprout, MD   4 mg at 12/09/11 0129  . ondansetron (ZOFRAN) injection 4 mg  4 mg Intravenous Once Gwyneth Sprout, MD   4 mg at 12/09/11 0446  . ondansetron (ZOFRAN) injection 4 mg  4 mg Intravenous Once Gwyneth Sprout, MD   4 mg at 12/09/11 0755  . ondansetron (ZOFRAN) injection 4 mg  4 mg Intravenous Q6H PRN Sorin C Laza, MD      . sodium chloride 0.9 % bolus 1,000 mL  1,000 mL Intravenous Once Gwyneth Sprout, MD   1,000 mL at 12/09/11 0128   Medications Prior to Admission  Medication Sig Dispense Refill  . amLODipine (NORVASC) 10 MG tablet Take 10 mg by mouth every morning.       Marland Kitchen omeprazole (PRILOSEC) 20 MG capsule Take 20 mg by mouth daily.      . pravastatin (PRAVACHOL) 40 MG tablet Take 40 mg by mouth at bedtime.       Marland Kitchen DISCONTD: cloNIDine (CATAPRES) 0.1 MG tablet Take 0.1 mg by mouth 2 (two) times daily.       Marland Kitchen DISCONTD: doxazosin (CARDURA) 4 MG tablet Take 4 mg by mouth at bedtime.       Marland Kitchen DISCONTD: enoxaparin (LOVENOX) 40 MG/0.4ML SOLN Inject 0.4 mLs (40 mg total) into the skin daily.  11.2 mL  3  . DISCONTD: lisinopril (PRINIVIL,ZESTRIL) 40 MG tablet Take 40 mg by mouth every morning.         Blood pressure 146/112, pulse 124, temperature 98.1 F (36.7 C), temperature source Oral, resp. rate 15, SpO2 100.00%. Physical Exam:General appearance - oriented to person, place, and time, ill-appearing, lying flat in bed,  frequent hiccups Eyes - PERRLA, EOMI Mouth - mucous membranes moist, pharynx normal without lesions Chest - no tachypnea, retractions or cyanosis Heart - regular rhythm, mild tachycardia. Abdomen - tenderness noted mild diffusely, BS diminished. Right ostomy bag with urine draining to foley.  Neurological - screening mental status exam normal, motor and sensory grossly normal bilaterally Extremities - peripheral pulses normal, no pedal edema, no clubbing or cyanosis Skin - normal coloration and turgor, no rashes, no suspicious skin lesions noted   Results for orders placed during the hospital encounter of 12/09/11 (from the past 48 hour(s))  CBC     Status: Normal  Collection Time   12/09/11 12:42 AM      Component Value Range Comment   WBC 7.9  4.0 - 10.5 (K/uL)    RBC 4.66  4.22 - 5.81 (MIL/uL)    Hemoglobin 13.8  13.0 - 17.0 (g/dL)    HCT 40.9  81.1 - 91.4 (%)    MCV 85.8  78.0 - 100.0 (fL)    MCH 29.6  26.0 - 34.0 (pg)    MCHC 34.5  30.0 - 36.0 (g/dL)    RDW 78.2  95.6 - 21.3 (%)    Platelets 275  150 - 400 (K/uL)   DIFFERENTIAL     Status: Abnormal   Collection Time   12/09/11 12:42 AM      Component Value Range Comment   Neutrophils Relative 85 (*) 43 - 77 (%)    Neutro Abs 6.7  1.7 - 7.7 (K/uL)    Lymphocytes Relative 12  12 - 46 (%)    Lymphs Abs 0.9  0.7 - 4.0 (K/uL)    Monocytes Relative 3  3 - 12 (%)    Monocytes Absolute 0.3  0.1 - 1.0 (K/uL)    Eosinophils Relative 0  0 - 5 (%)    Eosinophils Absolute 0.0  0.0 - 0.7 (K/uL)    Basophils Relative 0  0 - 1 (%)    Basophils Absolute 0.0  0.0 - 0.1 (K/uL)   COMPREHENSIVE METABOLIC PANEL     Status: Abnormal   Collection Time   12/09/11 12:42 AM      Component Value Range Comment   Sodium 146 (*) 135 - 145 (mEq/L)    Potassium 3.6  3.5 - 5.1 (mEq/L)    Chloride 107  96 - 112 (mEq/L)    CO2 24  19 - 32 (mEq/L)    Glucose, Bld 190 (*) 70 - 99 (mg/dL)    BUN 23  6 - 23 (mg/dL)    Creatinine, Ser 0.86 (*) 0.50 - 1.35  (mg/dL)    Calcium 9.7  8.4 - 10.5 (mg/dL)    Total Protein 8.0  6.0 - 8.3 (g/dL)    Albumin 4.1  3.5 - 5.2 (g/dL)    AST 14  0 - 37 (U/L)    ALT 14  0 - 53 (U/L)    Alkaline Phosphatase 76  39 - 117 (U/L)    Total Bilirubin 0.6  0.3 - 1.2 (mg/dL)    GFR calc non Af Amer 37 (*) >90 (mL/min)    GFR calc Af Amer 43 (*) >90 (mL/min)   LACTIC ACID, PLASMA     Status: Normal   Collection Time   12/09/11 12:50 AM      Component Value Range Comment   Lactic Acid, Venous 2.1  0.5 - 2.2 (mmol/L)   URINALYSIS, ROUTINE W REFLEX MICROSCOPIC     Status: Abnormal   Collection Time   12/09/11  2:20 AM      Component Value Range Comment   Color, Urine YELLOW  YELLOW     APPearance CLOUDY (*) CLEAR     Specific Gravity, Urine 1.015  1.005 - 1.030     pH 7.0  5.0 - 8.0     Glucose, UA NEGATIVE  NEGATIVE (mg/dL)    Hgb urine dipstick TRACE (*) NEGATIVE     Bilirubin Urine NEGATIVE  NEGATIVE     Ketones, ur NEGATIVE  NEGATIVE (mg/dL)    Protein, ur >578 (*) NEGATIVE (mg/dL)    Urobilinogen, UA 1.0  0.0 - 1.0 (mg/dL)    Nitrite NEGATIVE  NEGATIVE     Leukocytes, UA SMALL (*) NEGATIVE    URINE MICROSCOPIC-ADD ON     Status: Abnormal   Collection Time   12/09/11  2:20 AM      Component Value Range Comment   Squamous Epithelial / LPF RARE  RARE     WBC, UA 3-6  <3 (WBC/hpf)    RBC / HPF 0-2  <3 (RBC/hpf)    Bacteria, UA FEW (*) RARE     Urine-Other MUCOUS PRESENT      Ct Abdomen Pelvis Wo Contrast  12/09/2011  *RADIOLOGY REPORT*  Clinical Data: Lower abdominal pain.  CT ABDOMEN AND PELVIS WITHOUT CONTRAST  Technique:  Multidetector CT imaging of the abdomen and pelvis was performed following the standard protocol without intravenous contrast.  Comparison: CT of the abdomen and pelvis performed 11/18/2011, and renal ultrasound performed 11/19/2011  Findings: The visualized lung bases are clear.  Scattered hypodensities are noted within the liver, seen to reflect small cysts on the prior MRI.  Known renal  cysts are less well characterized.  The liver is otherwise unremarkable in appearance. The spleen is within normal limits.  The gallbladder is unremarkable.  The pancreas and adrenal glands are within normal limits.  A 5 mm stone is again noted near the lower pole of the left kidney. There is mild left-sided renal scarring.  The right kidney is unremarkable in appearance.  There is no evidence of hydronephrosis.  No obstructing ureteral stones are seen.  No perinephric stranding is appreciated.  There is an apparent partial volvulus at the right mid abdomen, with a whirl sign in the mesentery and intermittent decompression of small bowel loops in the periphery of the volvulus.  There is distension of small bowel loops up to 3.4 cm in maximal diameter. This raises concern for partial obstruction due to the volvulus. No definite closed loop obstruction is identified.  The anastomosis at the right lower quadrant is grossly unremarkable in appearance.  The appendix remains normal in caliber, though difficult to fully assess due to surrounding soft tissue stranding. The appearance of soft tissue inflammation at the right lower quadrant is relatively stable, with slightly more free fluid now seen.  The colon is unremarkable in appearance.  Evaluation of the colon is suboptimal due to motion artifact.  The ileal conduit is not well assessed; the lack of hydronephrosis suggests against obstruction.  The remaining prostate remains grossly normal in size, with associated postoperative change.  No inguinal lymphadenopathy is seen.  No acute osseous abnormalities are identified.  A sclerotic focus is again noted at the left iliac bone, adjacent to the left sacroiliac joint.  IMPRESSION:  1.  Apparent partial small bowel volvulus at the right mid abdomen, with a whirl sign in the mesentery and intermittent decompression of small bowel loops in the periphery of the volvulus.  Distension of small bowel loops to 3.4 cm in maximal  diameter.  This raises concern for partial obstruction due to the volvulus.  No high-grade small bowel obstruction seen. 2.  Slightly more free fluid now noted at the right lower quadrant, though the degree of soft tissue inflammation at the right lower quadrant appears relatively stable.  This is noted adjacent to the appendix, though it does not appear centered at the appendix. 3.  No evidence of hydronephrosis; mild left-sided renal scarring and 5 mm left renal stone again noted. 4.  Scattered renal and hepatic cysts  seen. 5.  Anastomosis at the right lower quadrant is grossly unremarkable in appearance. 6.  Ileal conduit not well assessed.  Original Report Authenticated By: Tonia Ghent, M.D.       Assessment/Plan 61 yo male with hx of bladder cancer s/p ileal conduit procedure in 10/2011 presents with abdominal pain and CT findings c/w partial small bowel obstruction/volvulus.  1. Partial SBO/volvulus. Agree with conservative management trial of NGT to suction, NPO, IV hydration, pain control IV morphine prn. Clinical improvement with initial management thus far. Will continue to follow clinically.   2. ARF. Per primary medicine service.  3. Hx bladder cancer. Ileal conduit per urology management. Patient to [facility] for admission.   Jermain Curt PGY-2 12/09/2011, 10:05 AM

## 2011-12-09 NOTE — ED Notes (Signed)
Gave report to Barbara RN

## 2011-12-09 NOTE — Progress Notes (Signed)
Volvulus of small intestine  Subjective: Patient feels much better since N/G placed. No abdominal pain. Passed a little gas.  Objective: Vital signs in last 24 hours: Temp:  [98 F (36.7 C)-98.5 F (36.9 C)] 98.5 F (36.9 C) (04/08 1045) Pulse Rate:  [98-140] 114  (04/08 1045) Resp:  [12-21] 20  (04/08 1045) BP: (133-156)/(94-131) 133/94 mmHg (04/08 1045) SpO2:  [100 %] 100 % (04/08 1045) Weight:  [149 lb 14.6 oz (68 kg)] 149 lb 14.6 oz (68 kg) (04/08 1045)    Intake/Output from previous day:   Intake/Output this shift: Total I/O In: 250 [I.V.:250] Out: 750 [Other:750]  General appearance: alert, cachectic and no distress GI: Abdomen is soft, not distended and not tender. Dressing dry  Lab Results:  Results for orders placed during the hospital encounter of 12/09/11 (from the past 24 hour(s))  CBC     Status: Normal   Collection Time   12/09/11 12:42 AM      Component Value Range   WBC 7.9  4.0 - 10.5 (K/uL)   RBC 4.66  4.22 - 5.81 (MIL/uL)   Hemoglobin 13.8  13.0 - 17.0 (g/dL)   HCT 13.0  86.5 - 78.4 (%)   MCV 85.8  78.0 - 100.0 (fL)   MCH 29.6  26.0 - 34.0 (pg)   MCHC 34.5  30.0 - 36.0 (g/dL)   RDW 69.6  29.5 - 28.4 (%)   Platelets 275  150 - 400 (K/uL)  DIFFERENTIAL     Status: Abnormal   Collection Time   12/09/11 12:42 AM      Component Value Range   Neutrophils Relative 85 (*) 43 - 77 (%)   Neutro Abs 6.7  1.7 - 7.7 (K/uL)   Lymphocytes Relative 12  12 - 46 (%)   Lymphs Abs 0.9  0.7 - 4.0 (K/uL)   Monocytes Relative 3  3 - 12 (%)   Monocytes Absolute 0.3  0.1 - 1.0 (K/uL)   Eosinophils Relative 0  0 - 5 (%)   Eosinophils Absolute 0.0  0.0 - 0.7 (K/uL)   Basophils Relative 0  0 - 1 (%)   Basophils Absolute 0.0  0.0 - 0.1 (K/uL)  COMPREHENSIVE METABOLIC PANEL     Status: Abnormal   Collection Time   12/09/11 12:42 AM      Component Value Range   Sodium 146 (*) 135 - 145 (mEq/L)   Potassium 3.6  3.5 - 5.1 (mEq/L)   Chloride 107  96 - 112 (mEq/L)   CO2 24   19 - 32 (mEq/L)   Glucose, Bld 190 (*) 70 - 99 (mg/dL)   BUN 23  6 - 23 (mg/dL)   Creatinine, Ser 1.32 (*) 0.50 - 1.35 (mg/dL)   Calcium 9.7  8.4 - 44.0 (mg/dL)   Total Protein 8.0  6.0 - 8.3 (g/dL)   Albumin 4.1  3.5 - 5.2 (g/dL)   AST 14  0 - 37 (U/L)   ALT 14  0 - 53 (U/L)   Alkaline Phosphatase 76  39 - 117 (U/L)   Total Bilirubin 0.6  0.3 - 1.2 (mg/dL)   GFR calc non Af Amer 37 (*) >90 (mL/min)   GFR calc Af Amer 43 (*) >90 (mL/min)  LACTIC ACID, PLASMA     Status: Normal   Collection Time   12/09/11 12:50 AM      Component Value Range   Lactic Acid, Venous 2.1  0.5 - 2.2 (mmol/L)  URINALYSIS, ROUTINE W REFLEX  MICROSCOPIC     Status: Abnormal   Collection Time   12/09/11  2:20 AM      Component Value Range   Color, Urine YELLOW  YELLOW    APPearance CLOUDY (*) CLEAR    Specific Gravity, Urine 1.015  1.005 - 1.030    pH 7.0  5.0 - 8.0    Glucose, UA NEGATIVE  NEGATIVE (mg/dL)   Hgb urine dipstick TRACE (*) NEGATIVE    Bilirubin Urine NEGATIVE  NEGATIVE    Ketones, ur NEGATIVE  NEGATIVE (mg/dL)   Protein, ur >409 (*) NEGATIVE (mg/dL)   Urobilinogen, UA 1.0  0.0 - 1.0 (mg/dL)   Nitrite NEGATIVE  NEGATIVE    Leukocytes, UA SMALL (*) NEGATIVE   URINE MICROSCOPIC-ADD ON     Status: Abnormal   Collection Time   12/09/11  2:20 AM      Component Value Range   Squamous Epithelial / LPF RARE  RARE    WBC, UA 3-6  <3 (WBC/hpf)   RBC / HPF 0-2  <3 (RBC/hpf)   Bacteria, UA FEW (*) RARE    Urine-Other MUCOUS PRESENT       Studies/Results Radiology     MEDS, Scheduled    . sodium chloride   Intravenous STAT  . iohexol  20 mL Oral Q1 Hr x 2  . lidocaine      .  morphine injection  4 mg Intravenous Once  .  morphine injection  4 mg Intravenous Once  .  morphine injection  4 mg Intravenous Once  . ondansetron  4 mg Intravenous Once  . ondansetron  4 mg Intravenous Once  . ondansetron  4 mg Intravenous Once  . pantoprazole (PROTONIX) IV  40 mg Intravenous Q24H  . sodium  chloride  1,000 mL Intravenous Once     Assessment: Volvulus of small intestine SBO? improved with NG. Nl wbc, no pain and neg exam for acute peritonitits  Plan: Will follow. Do not see surgical indication now, but will see how he progresses  LOS: 0 days    Currie Paris, MD, [facility], [place] [phone]   12/09/2011 1:05 PM

## 2011-12-09 NOTE — ED Notes (Signed)
C/o lower abd pain, nausea, and vomiting since Sunday morning.

## 2011-12-09 NOTE — H&P (Signed)
PCP:   Oneal Grout, MD, MD   Chief Complaint:  Abdominal pain, nausea  HPI: 61 year old male with a history of bladder cancer who underwent bladder and prostate resection with creation of an ileal conduit for urinary diversion in February of 2013. He presented to the ED on the night of 4/8 after he started experiencing sharp abdominal pains on the afternoon of April 7. He says he may be passing ocasional gas.   CT scan while in the ER shows a small bowel volvulus. There is some dilation of the proximal small bowel, but there is air and contrast beyond this area   Review of Systems:  The patient denies anorexia, fever, weight loss,, vision loss, decreased hearing, hoarseness, chest pain, syncope, dyspnea on exertion, peripheral edema, balance deficits, hemoptysis, abdominal pain, melena, hematochezia, severe indigestion/heartburn, hematuria, Otherwise per HPI or negative    Past Medical History: Past Medical History  Diagnosis Date  . Hypertension 08-20-11  . Dysrhythmia 08-20-11    skip beat, hx. RBBB  . GERD (gastroesophageal reflux disease) 08-20-11    controls with diet, hx. colon polyps(benign)  . Cancer 08-20-11    bladder cancer  . Arthritis   . Stroke 08-20-11    '08-Lt. sided weakness with residual > lt. arm.  . Chronic kidney disease     CKD stage III   Past Surgical History  Procedure Date  . Bladder surgery 08-20-11    07-01-11 bladder surgery, past hx. lithotripsy  . Shoulder arthrotomy 08-20-11    left shoulder with retained pins  . Knee arthroscopy 08-20-11    bil. knee scopes-torn ligament  . Cystoscopy w/ ureteral stent placement 08/21/2011    Procedure: CYSTOSCOPY WITH RETROGRADE PYELOGRAM/URETERAL STENT PLACEMENT;  Surgeon: Milford Cage, MD;  Location: [facility];  Service: Urology;  Laterality: Left;  . Cystoscopy with biopsy 08/21/2011    Procedure: CYSTOSCOPY WITH BIOPSY;  Surgeon: Milford Cage, MD;  Location: [facility];  Service:  Urology;  Laterality: N/A;  . Transurethral resection of bladder tumor 08/21/2011    Procedure: TRANSURETHRAL RESECTION OF BLADDER TUMOR (TURBT);  Surgeon: Milford Cage, MD;  Location: [facility];  Service: Urology;  Laterality: N/A;  need PK gyrus  . Cystectomy 10/07/2011    Procedure: CYSTECTOMY COMPLETE;  Surgeon: Milford Cage, MD;  Location: [facility];  Service: Urology;  Laterality: Bilateral;  Radical Cystoprostatectomy Bilateral Pelvic Lymph Node Dissection Formation Of Ileal Conduit   . Prostatectomy 10/07/2011    Procedure: PROSTATECTOMY;  Surgeon: Milford Cage, MD;  Location: [facility];  Service: Urology;  Laterality: N/A;  . Lymph node dissection 10/07/2011    Procedure: LYMPH NODE DISSECTION;  Surgeon: Milford Cage, MD;  Location: [facility];  Service: Urology;  Laterality: Bilateral;    Medications: Prior to Admission medications   Medication Sig Start Date End Date Taking? Authorizing Provider  amLODipine (NORVASC) 10 MG tablet Take 10 mg by mouth every morning.    Yes Historical Provider, MD  omeprazole (PRILOSEC) 20 MG capsule Take 20 mg by mouth daily.   Yes Historical Provider, MD  pravastatin (PRAVACHOL) 40 MG tablet Take 40 mg by mouth at bedtime.    Yes Historical Provider, MD    Allergies:   Allergies  Allergen Reactions  . Penicillins Other (See Comments)    HAD WHEN WAS A CHILD   . Pregabalin Swelling    Social History:  reports that he quit smoking about 6 weeks ago. His smoking use included Cigarettes.  He has a 40 pack-year smoking history. He does not have any smokeless tobacco history on file. He reports that he uses illicit drugs (Marijuana) about 3 times per week. He reports that he does not drink alcohol.  History   Social History Narrative   Lives alone     Family History: No family history on file.  Physical Exam: Filed Vitals:   12/09/11 0500 12/09/11 0600 12/09/11 0848 12/09/11 0900  BP: 147/112 140/117 147/131 146/112    Pulse: 112 105 109 124  Temp:      TempSrc:      Resp: 14 12 16 15   SpO2: 100% 100% 100% 100%   General appearance: alert, cooperative, appears stated age and moderate distress Head: Normocephalic, without obvious abnormality, atraumatic Eyes: conjunctivae/corneas clear. PERRL, EOM's intact. Fundi benign. Nose: Nares normal. Septum midline. Mucosa normal. No drainage or sinus tenderness. Throat: abnormal findings: dry mucosa Neck: no adenopathy, no carotid bruit, no JVD, supple, symmetrical, trachea midline and thyroid not enlarged, symmetric, no tenderness/mass/nodules Resp: clear to auscultation bilaterally Chest wall: no tenderness Cardio: regular rate and rhythm, S1, S2 normal, no murmur, click, rub or gallop GI: urostomy in the llq, abdomen slight distension, no tenderness  Extremities: extremities normal, atraumatic, no cyanosis or edema Pulses: 2+ and symmetric Skin: Skin color, texture, turgor normal. No rashes or lesions Neurologic: Alert and oriented X 3, normal strength and tone on the right, left side with 4/5 weakness Le>UE.  Labs on Admission:   [facility] 12/09/11 0042  NA 146*  K 3.6  CL 107  CO2 24  GLUCOSE 190*  BUN 23  CREATININE 1.89*  CALCIUM 9.7  MG --  PHOS --    Basename 12/09/11 0042  AST 14  ALT 14  ALKPHOS 76  BILITOT 0.6  PROT 8.0  ALBUMIN 4.1   No results found for this basename: LIPASE:2,AMYLASE:2 in the last 72 hours  Basename 12/09/11 0042  WBC 7.9  NEUTROABS 6.7  HGB 13.8  HCT 40.0  MCV 85.8  PLT 275   No results found for this basename: CKTOTAL:3,CKMB:3,CKMBINDEX:3,TROPONINI:3 in the last 72 hours No results found for this basename: TSH,T4TOTAL,FREET3,T3FREE,THYROIDAB in the last 72 hours No results found for this basename: VITAMINB12:2,FOLATE:2,FERRITIN:2,TIBC:2,IRON:2,RETICCTPCT:2 in the last 72 hours  Radiological Exams on Admission: Ct Abdomen Pelvis Wo Contrast  12/09/2011  *RADIOLOGY REPORT*  Clinical Data: Lower  abdominal pain.  CT ABDOMEN AND PELVIS WITHOUT CONTRAST  Technique:  Multidetector CT imaging of the abdomen and pelvis was performed following the standard protocol without intravenous contrast.  Comparison: CT of the abdomen and pelvis performed 11/18/2011, and renal ultrasound performed 11/19/2011  Findings: The visualized lung bases are clear.  Scattered hypodensities are noted within the liver, seen to reflect small cysts on the prior MRI.  Known renal cysts are less well characterized.  The liver is otherwise unremarkable in appearance. The spleen is within normal limits.  The gallbladder is unremarkable.  The pancreas and adrenal glands are within normal limits.  A 5 mm stone is again noted near the lower pole of the left kidney. There is mild left-sided renal scarring.  The right kidney is unremarkable in appearance.  There is no evidence of hydronephrosis.  No obstructing ureteral stones are seen.  No perinephric stranding is appreciated.  There is an apparent partial volvulus at the right mid abdomen, with a whirl sign in the mesentery and intermittent decompression of small bowel loops in the periphery of the volvulus.  There is distension of  small bowel loops up to 3.4 cm in maximal diameter. This raises concern for partial obstruction due to the volvulus. No definite closed loop obstruction is identified.  The anastomosis at the right lower quadrant is grossly unremarkable in appearance.  The appendix remains normal in caliber, though difficult to fully assess due to surrounding soft tissue stranding. The appearance of soft tissue inflammation at the right lower quadrant is relatively stable, with slightly more free fluid now seen.  The colon is unremarkable in appearance.  Evaluation of the colon is suboptimal due to motion artifact.  The ileal conduit is not well assessed; the lack of hydronephrosis suggests against obstruction.  The remaining prostate remains grossly normal in size, with associated  postoperative change.  No inguinal lymphadenopathy is seen.  No acute osseous abnormalities are identified.  A sclerotic focus is again noted at the left iliac bone, adjacent to the left sacroiliac joint.  IMPRESSION:  1.  Apparent partial small bowel volvulus at the right mid abdomen, with a whirl sign in the mesentery and intermittent decompression of small bowel loops in the periphery of the volvulus.  Distension of small bowel loops to 3.4 cm in maximal diameter.  This raises concern for partial obstruction due to the volvulus.  No high-grade small bowel obstruction seen. 2.  Slightly more free fluid now noted at the right lower quadrant, though the degree of soft tissue inflammation at the right lower quadrant appears relatively stable.  This is noted adjacent to the appendix, though it does not appear centered at the appendix. 3.  No evidence of hydronephrosis; mild left-sided renal scarring and 5 mm left renal stone again noted. 4.  Scattered renal and hepatic cysts seen. 5.  Anastomosis at the right lower quadrant is grossly unremarkable in appearance. 6.  Ileal conduit not well assessed.  Original Report Authenticated By: Tonia Ghent, M.D.   Ct Abdomen Pelvis Wo Contrast  11/18/2011  *RADIOLOGY REPORT*  Clinical Data: Abdominal pain, vomiting, diarrhea.  Hypotension. History of bladder cancer.  Recent colostomy.  CT ABDOMEN AND PELVIS WITHOUT CONTRAST  Technique:  Multidetector CT imaging of the abdomen and pelvis was performed following the standard protocol without intravenous contrast.  Comparison: 09/09/2011; 10/07/2011  Findings: Subsegmental atelectasis noted in both lower lobes. Scattered small hepatic hypodensities probably represent cysts but are technically nonspecific on today's noncontrast CT scan.  The spleen and adrenal glands appear normal.  Bilateral nephrolithiasis is present, and there is a 4 mm calculus at the right UPJ without obstruction.  There is fullness of the left proximal  ureter, with poor visualization of the distal ureter.  The ileal conduit is indistinct, as is its attachment to the ostomy site.  Orally administered contrast extends through loops of small bowel and to the level of the rectum.  There are air-fluid levels and scattered pelvic loops of small bowel, which are borderline dilated.  The colon does not appear dilated.  No pathologic adenopathy is currently observed.  Prior cystoprostatectomy noted stable sclerotic marginated lesion in the left iliac bone noted, not significantly changed from 03/18/2011.  IMPRESSION:  1.  Bilateral nephrolithiasis.  There is an apparently nonobstructive 4 mm right UPJ calculus. 2.  Scattered air-fluid levels and borderline dilated loops of small bowel.  However, orally administered contrast makes its way through to the rectum.  Query mild ileus. 3.  The distal ureters and their attachment to the ileal conduit are poorly seen, and better visualization would probably require delayed phase postcontrast imaging.  No definite hydroureter to suggest a urinary obstruction.  No abnormal fluid collection is observed around the ostomy site, and no peristomal hernia is noted. 4.  Hypodense hepatic lesions are similar to prior MRI and probably represent cysts.  Original Report Authenticated By: Dellia Cloud, M.D.   Dg Abd 1 View  11/19/2011  *RADIOLOGY REPORT*  Clinical Data: Evaluate for ileus.  Nausea and hiccups.  History of colon surgery for cancer.  ABDOMEN - 1 VIEW  Comparison: CT abdomen pelvis 11/18/2011  Findings: Oral contrast from yesterday's CT scan has advanced into the colon.  Several diverticula associated with the descending colon are noted.  Gas is seen scattered throughout multiple loops of small bowel and colon.  Some of the small bowel loops are borderline prominent caliber and the left upper quadrant and central abdomen.  Gas is seen to the rectum.  No acute or suspicious bony abnormality.  IMPRESSION:  1.  No evidence  of bowel obstruction.  Oral contrast has progressed into the colon from yesterday's CT. 2. Gas throughout the bowel loops, with borderline prominent small bowel loops in the left upper quadrant and central abdomen. No significant change in bowel gas pattern compared to the abdomen pelvis CT of 11/18/2011.  The bowel gas pattern is nonspecific, but could be seen in the setting of gastroenteritis or mild ileus.  Original Report Authenticated By: Britta Mccreedy, M.D.   US Renal  11/19/2011  *RADIOLOGY REPORT*  Clinical Data:  Acute renal failure  RENAL/URINARY TRACT ULTRASOUND COMPLETE  Comparison:  11/18/2011  Findings:  Right Kidney:  The right kidney is diffusely echogenic measuring 12.1 cm. Normal in size.  No mass or hydronephrosis.  Left Kidney:  The left kidney measures 11 cm.  The left kidney is diffusely echogenic.Normal in size and parenchymal echogenicity. No evidence of mass or hydronephrosis. Stone is identified within the inferior pole of the left kidney measuring approximately 6 mm.  Bladder:  Appears normal for degree of bladder distention.  IMPRESSION:  1.  Bilateral echogenic kidneys compatible with medical renal disease. 2.  No evidence for obstructive uropathy.  Original Report Authenticated By: Rosealee Albee, M.D.    Assessment/Plan   1. Volvulus of small intestine - seems partially obstructed and not high grade SBO . Plan for bowel rest, ng tube and symptomatic management. Dr. Margarita Grizzle and CCS following along to assure no need for exploration. Will repeat axr film tomorrow.    2. ARI/Dehydration - due to 3rd space losses in the SB - start iv fluids , monitor urine output through urostomy and f/u BMP  3. Further plans depending on how situation evolves    Zennie Ayars 12/09/2011, 9:07 AM

## 2011-12-09 NOTE — ED Notes (Signed)
Pt waiting to be scanned.   Has been drinking contrast

## 2011-12-09 NOTE — Consult Note (Signed)
I reviewed this case with the radiologist and I agree with the stated plan.  Unfortunately, the patient was transferred to [facility] prior to my seeing him.  Our team at [facility] is aware and I spoke with Dr. Margarita Grizzle. Violeta Gelinas, MD, MPH, FACS Pager: [phone] \

## 2011-12-09 NOTE — ED Notes (Signed)
Ice given to pt per Dr. Anitra Lauth. Will continue to monitor.

## 2011-12-09 NOTE — ED Notes (Signed)
Iv started by iv team.  nss added to iv to run wo.  Med given for pain and nausea

## 2011-12-10 ENCOUNTER — Inpatient Hospital Stay (HOSPITAL_COMMUNITY): Payer: Medicare Other

## 2011-12-10 DIAGNOSIS — K562 Volvulus: Secondary | ICD-10-CM

## 2011-12-10 LAB — BASIC METABOLIC PANEL
BUN: 31 mg/dL — ABNORMAL HIGH (ref 6–23)
Calcium: 9 mg/dL (ref 8.4–10.5)
Creatinine, Ser: 1.96 mg/dL — ABNORMAL HIGH (ref 0.50–1.35)
GFR calc Af Amer: 41 mL/min — ABNORMAL LOW (ref >90)

## 2011-12-10 LAB — CBC
HCT: 40.7 % (ref 39.0–52.0)
MCHC: 33.7 g/dL (ref 30.0–36.0)
MCV: 87.3 fL (ref 78.0–100.0)
Platelets: 302 10*3/uL (ref 150–400)
RDW: 15 % (ref 11.5–15.5)
WBC: 3.7 10*3/uL — ABNORMAL LOW (ref 4.0–10.5)

## 2011-12-10 MED ORDER — CHLORPROMAZINE HCL 25 MG/ML IJ SOLN
25.0000 mg | Freq: Every day | INTRAMUSCULAR | Status: DC | PRN
  Administered 2011-12-10: 25 mg via INTRAMUSCULAR
  Filled 2011-12-10: qty 1

## 2011-12-10 NOTE — Progress Notes (Signed)
Subjective: Pt states "i would like some ice chips please". Also reports sleeping very well last night. Denies pain/discomfort.   Objective: Vital signs Filed Vitals:   12/10/11 0011 12/10/11 0214 12/10/11 0458 12/10/11 0630  BP: 143/106 126/93 104/76 106/75  Pulse: 123 111 118 122  Temp:  98.3 F (36.8 C) 99.2 F (37.3 C) 98.2 F (36.8 C)  TempSrc:  Oral Oral Oral  Resp:  20 18 18   Height:      Weight:      SpO2:  100% 97% 98%   Weight change:  Last BM Date: 12/07/11  Intake/Output from previous day: 04/08 0701 - 04/09 0700 In: 655.8 [I.V.:643.8; IV Piggyback:12] Out: 1450 [Urine:600; Emesis/NG output:100]     Physical Exam: General: Alert, awake, oriented x3, in no acute distress. Hiccups HEENT: No bruits, no goiter. Mucus membranes mouth slightly dry but pink. PERRL, EOMI Heart: tachycardic but regular, without murmurs, rubs, gallops. No LEE Lungs:Normal effort. Breath sounds clear to auscultation bilaterally. No wheeze, rhonchi Abdomen: slightly firm, slightly tender throughout to vigorous palpation, nondistended, positive bowel sounds. Urosotomy draining clear urine. NG to intermittent suction draining clear drainage Extremities: No clubbing cyanosis or edema with positive pedal pulses. Neuro: Grossly intact, nonfocal.     Lab Results: Basic Metabolic Panel:  Basename 12/10/11 0447 12/09/11 0042  NA 144 146*  K 3.8 3.6  CL 109 107  CO2 24 24  GLUCOSE 134* 190*  BUN 31* 23  CREATININE 1.96* 1.89*  CALCIUM 9.0 9.7  MG -- --  PHOS -- --   Liver Function Tests:  Basename 12/09/11 0042  AST 14  ALT 14  ALKPHOS 76  BILITOT 0.6  PROT 8.0  ALBUMIN 4.1   No results found for this basename: LIPASE:2,AMYLASE:2 in the last 72 hours No results found for this basename: AMMONIA:2 in the last 72 hours CBC:  Basename 12/10/11 0447 12/09/11 0042  WBC 3.7* 7.9  NEUTROABS -- 6.7  HGB 13.7 13.8  HCT 40.7 40.0  MCV 87.3 85.8  PLT 302 275   Cardiac  Enzymes: No results found for this basename: CKTOTAL:3,CKMB:3,CKMBINDEX:3,TROPONINI:3 in the last 72 hours BNP: No results found for this basename: PROBNP:3 in the last 72 hours D-Dimer: No results found for this basename: DDIMER:2 in the last 72 hours CBG: No results found for this basename: GLUCAP:6 in the last 72 hours Hemoglobin A1C: No results found for this basename: HGBA1C in the last 72 hours Fasting Lipid Panel: No results found for this basename: CHOL,HDL,LDLCALC,TRIG,CHOLHDL,LDLDIRECT in the last 72 hours Thyroid Function Tests: No results found for this basename: TSH,T4TOTAL,FREET4,T3FREE,THYROIDAB in the last 72 hours Anemia Panel: No results found for this basename: VITAMINB12,FOLATE,FERRITIN,TIBC,IRON,RETICCTPCT in the last 72 hours Coagulation: No results found for this basename: LABPROT:2,INR:2 in the last 72 hours Urine Drug Screen: Drugs of Abuse  No results found for this basename: labopia, cocainscrnur, labbenz, amphetmu, thcu, labbarb    Alcohol Level: No results found for this basename: ETH:2 in the last 72 hours Urinalysis:  Basename 12/09/11 0220  COLORURINE YELLOW  LABSPEC 1.015  PHURINE 7.0  GLUCOSEU NEGATIVE  HGBUR TRACE*  BILIRUBINUR NEGATIVE  KETONESUR NEGATIVE  PROTEINUR >300*  UROBILINOGEN 1.0  NITRITE NEGATIVE  LEUKOCYTESUR SMALL*   Misc. Labs:  No results found for this or any previous visit (from the past 240 hour(s)).  Studies/Results: Ct Abdomen Pelvis Wo Contrast  12/09/2011  *RADIOLOGY REPORT*  Clinical Data: Lower abdominal pain.  CT ABDOMEN AND PELVIS WITHOUT CONTRAST  Technique:  Multidetector CT  imaging of the abdomen and pelvis was performed following the standard protocol without intravenous contrast.  Comparison: CT of the abdomen and pelvis performed 11/18/2011, and renal ultrasound performed 11/19/2011  Findings: The visualized lung bases are clear.  Scattered hypodensities are noted within the liver, seen to reflect small  cysts on the prior MRI.  Known renal cysts are less well characterized.  The liver is otherwise unremarkable in appearance. The spleen is within normal limits.  The gallbladder is unremarkable.  The pancreas and adrenal glands are within normal limits.  A 5 mm stone is again noted near the lower pole of the left kidney. There is mild left-sided renal scarring.  The right kidney is unremarkable in appearance.  There is no evidence of hydronephrosis.  No obstructing ureteral stones are seen.  No perinephric stranding is appreciated.  There is an apparent partial volvulus at the right mid abdomen, with a whirl sign in the mesentery and intermittent decompression of small bowel loops in the periphery of the volvulus.  There is distension of small bowel loops up to 3.4 cm in maximal diameter. This raises concern for partial obstruction due to the volvulus. No definite closed loop obstruction is identified.  The anastomosis at the right lower quadrant is grossly unremarkable in appearance.  The appendix remains normal in caliber, though difficult to fully assess due to surrounding soft tissue stranding. The appearance of soft tissue inflammation at the right lower quadrant is relatively stable, with slightly more free fluid now seen.  The colon is unremarkable in appearance.  Evaluation of the colon is suboptimal due to motion artifact.  The ileal conduit is not well assessed; the lack of hydronephrosis suggests against obstruction.  The remaining prostate remains grossly normal in size, with associated postoperative change.  No inguinal lymphadenopathy is seen.  No acute osseous abnormalities are identified.  A sclerotic focus is again noted at the left iliac bone, adjacent to the left sacroiliac joint.  IMPRESSION:  1.  Apparent partial small bowel volvulus at the right mid abdomen, with a whirl sign in the mesentery and intermittent decompression of small bowel loops in the periphery of the volvulus.  Distension of  small bowel loops to 3.4 cm in maximal diameter.  This raises concern for partial obstruction due to the volvulus.  No high-grade small bowel obstruction seen. 2.  Slightly more free fluid now noted at the right lower quadrant, though the degree of soft tissue inflammation at the right lower quadrant appears relatively stable.  This is noted adjacent to the appendix, though it does not appear centered at the appendix. 3.  No evidence of hydronephrosis; mild left-sided renal scarring and 5 mm left renal stone again noted. 4.  Scattered renal and hepatic cysts seen. 5.  Anastomosis at the right lower quadrant is grossly unremarkable in appearance. 6.  Ileal conduit not well assessed.  Original Report Authenticated By: Tonia Ghent, M.D.    Medications: Scheduled Meds:   . sodium chloride   Intravenous STAT  . cloNIDine  0.2 mg Transdermal Weekly  . hydrALAZINE  10 mg Intravenous Q6H  . lidocaine      . metoprolol      . metoprolol  5 mg Intravenous Q6H  .  morphine injection  4 mg Intravenous Once  . ondansetron  4 mg Intravenous Once  . pantoprazole (PROTONIX) IV  40 mg Intravenous Q24H  . DISCONTD: metoprolol  5 mg Intravenous Q6H   Continuous Infusions:   . dextrose 5 %  and 0.45 % NaCl with KCl 40 mEq/L 20 mL/hr at 12/09/11 1937   PRN Meds:.morphine injection, ondansetron (ZOFRAN) IV  Assessment/Plan:  Principal Problem:  *Volvulus of small intestine Active Problems:  HYPERLIPIDEMIA  HYPERTENSION  S/P radical cystoprostatectomy  Attention to urostomy  H/O: CVA (cardiovascular accident)  Acute renal failure (ARF)  Dehydration 1. Volvulus of small intestine - seems partially obstructed and not high grade SBO . Repeat xray results pending. continue bowel rest, ng tube and symptomatic management. On PPI.  Dr. Margarita Grizzle and CCS following along to assure no need for exploration. 2. ARI/Dehydration - due to 3rd space losses in the SB in setting of CKD III - creatinine 1.9>1.8 yesterday.  Continue  iv fluids at slightly higher rate. Volume status -500cc for hospitalization.  Monitor closely 3. Tacycardia: likely related to #2. Will very gently give iv fluids. Range 111-121. Temp 99.2 Monitor closely 4. Hypertension: better control with iv hydralazine, lopressor, clonidine patch. SBP range 104-143. Concern about clonidine patch max effect next 24 hours. Will discontinue patch and monitor closely. If BP begins to creep up will consider increasing BB.  5. Further plans depending on how situation evolves  6. Dispo: home once medically stable.       LOS: 1 day   [facility] M 12/10/2011, 7:31 AM

## 2011-12-10 NOTE — Progress Notes (Signed)
Subjective: No pain today, abd feels better.  He really wants some Ice  Afebrile BP up some, HR up some 110-120's, NG only has 100 ml recorded Objective: Vital signs in last 24 hours: Temp:  [97.5 F (36.4 C)-99.2 F (37.3 C)] 98.2 F (36.8 C) (04/09 0630) Pulse Rate:  [104-124] 122  (04/09 0630) Resp:  [15-20] 18  (04/09 0630) BP: (104-165)/(75-135) 106/75 mmHg (04/09 0630) SpO2:  [97 %-100 %] 98 % (04/09 0630) Weight:  [68 kg (149 lb 14.6 oz)] 68 kg (149 lb 14.6 oz) (04/08 1045) Last BM Date: 12/07/11  Intake/Output from previous day: 04/08 0701 - 04/09 0700 In: 929.7 [I.V.:917.7; IV Piggyback:12] Out: 1450 [Urine:600; Emesis/NG output:100] Intake/Output this shift:    General appearance: alert, cooperative and no distress Resp: clear to auscultation bilaterally GI: soft, non-tender; bowel sounds normal; no masses,  no organomegaly and No pain today, no distension+BM, +FLATUS, Urostomy working well  Lab Results:   Basename 12/10/11 0447 12/09/11 0042  WBC 3.7* 7.9  HGB 13.7 13.8  HCT 40.7 40.0  PLT 302 275    BMET  Basename 12/10/11 0447 12/09/11 0042  NA 144 146*  K 3.8 3.6  CL 109 107  CO2 24 24  GLUCOSE 134* 190*  BUN 31* 23  CREATININE 1.96* 1.89*  CALCIUM 9.0 9.7   PT/INR No results found for this basename: LABPROT:2,INR:2 in the last 72 hours   Lab 12/09/11 0042  AST 14  ALT 14  ALKPHOS 76  BILITOT 0.6  PROT 8.0  ALBUMIN 4.1     Lipase  No results found for this basename: lipase     Studies/Results: Ct Abdomen Pelvis Wo Contrast  12/09/2011  *RADIOLOGY REPORT*  Clinical Data: Lower abdominal pain.  CT ABDOMEN AND PELVIS WITHOUT CONTRAST  Technique:  Multidetector CT imaging of the abdomen and pelvis was performed following the standard protocol without intravenous contrast.  Comparison: CT of the abdomen and pelvis performed 11/18/2011, and renal ultrasound performed 11/19/2011  Findings: The visualized lung bases are clear.  Scattered  hypodensities are noted within the liver, seen to reflect small cysts on the prior MRI.  Known renal cysts are less well characterized.  The liver is otherwise unremarkable in appearance. The spleen is within normal limits.  The gallbladder is unremarkable.  The pancreas and adrenal glands are within normal limits.  A 5 mm stone is again noted near the lower pole of the left kidney. There is mild left-sided renal scarring.  The right kidney is unremarkable in appearance.  There is no evidence of hydronephrosis.  No obstructing ureteral stones are seen.  No perinephric stranding is appreciated.  There is an apparent partial volvulus at the right mid abdomen, with a whirl sign in the mesentery and intermittent decompression of small bowel loops in the periphery of the volvulus.  There is distension of small bowel loops up to 3.4 cm in maximal diameter. This raises concern for partial obstruction due to the volvulus. No definite closed loop obstruction is identified.  The anastomosis at the right lower quadrant is grossly unremarkable in appearance.  The appendix remains normal in caliber, though difficult to fully assess due to surrounding soft tissue stranding. The appearance of soft tissue inflammation at the right lower quadrant is relatively stable, with slightly more free fluid now seen.  The colon is unremarkable in appearance.  Evaluation of the colon is suboptimal due to motion artifact.  The ileal conduit is not well assessed; the lack of hydronephrosis  suggests against obstruction.  The remaining prostate remains grossly normal in size, with associated postoperative change.  No inguinal lymphadenopathy is seen.  No acute osseous abnormalities are identified.  A sclerotic focus is again noted at the left iliac bone, adjacent to the left sacroiliac joint.  IMPRESSION:  1.  Apparent partial small bowel volvulus at the right mid abdomen, with a whirl sign in the mesentery and intermittent decompression of small  bowel loops in the periphery of the volvulus.  Distension of small bowel loops to 3.4 cm in maximal diameter.  This raises concern for partial obstruction due to the volvulus.  No high-grade small bowel obstruction seen. 2.  Slightly more free fluid now noted at the right lower quadrant, though the degree of soft tissue inflammation at the right lower quadrant appears relatively stable.  This is noted adjacent to the appendix, though it does not appear centered at the appendix. 3.  No evidence of hydronephrosis; mild left-sided renal scarring and 5 mm left renal stone again noted. 4.  Scattered renal and hepatic cysts seen. 5.  Anastomosis at the right lower quadrant is grossly unremarkable in appearance. 6.  Ileal conduit not well assessed.  Original Report Authenticated By: Tonia Ghent, M.D.   Dg Abd 1 View  12/10/2011  *RADIOLOGY REPORT*  Clinical Data: Follow-up small bowel obstruction  ABDOMEN - 1 VIEW  Comparison: 12/09/2011  Findings: Mildly prominent loops of small bowel in the mid abdomen, similar versus mildly improved when compared to topogram from prior CT.  Contrast within a loop of nondilated small bowel in the left mid abdomen.  Visualized portions of the cecum, proximal transverse colon, and rectum remain unopacified but are not decompressed.  Surgical clips in the pelvis.  Enteric tube looped in the gastric fundus.  IMPRESSION: Mildly prominent loops of small bowel in the mid abdomen, similar versus mildly improved when compared to prior CT.  Original Report Authenticated By: Charline Bills, M.D.    Medications:    . sodium chloride   Intravenous STAT  . cloNIDine  0.2 mg Transdermal Weekly  . hydrALAZINE  10 mg Intravenous Q6H  . lidocaine      . metoprolol      . metoprolol  5 mg Intravenous Q6H  . pantoprazole (PROTONIX) IV  40 mg Intravenous Q24H  . DISCONTD: metoprolol  5 mg Intravenous Q6H    Assessment/Plan Possible SBO, Volvulus of small bowel hx of bladder cancer s/p  ileal conduit procedure in 10/2011  Hypertension  Dysrhythmia  Hx stroke Renal Insuffiency/ARF improving    Plan:  Films shows no free air.Will leave NG for today and start ice chips.    LOS: 1 day    Rozlynn Lippold 12/10/2011

## 2011-12-10 NOTE — Progress Notes (Signed)
INITIAL ADULT NUTRITION ASSESSMENT Date: 12/10/2011   Time: 2:06 PM Reason for Assessment: Nutrition Risk-weight loss  ASSESSMENT: Male 61 y.o.  Dx: Volvulus of small intestine, possible SBO, renal insufficiency, ARF, positive flatus, no BM today  Hx:  Past Medical History  Diagnosis Date  . Hypertension 08-20-11  . Dysrhythmia 08-20-11    skip beat, hx. RBBB  . GERD (gastroesophageal reflux disease) 08-20-11    controls with diet, hx. colon polyps(benign)  . Cancer 08-20-11    bladder cancer  . Arthritis   . Stroke 08-20-11    '08-Lt. sided weakness with residual > lt. arm.  . Chronic kidney disease     CKD stage III   Past Surgical History  Procedure Date  . Bladder surgery 08-20-11    07-01-11 bladder surgery, past hx. lithotripsy  . Shoulder arthrotomy 08-20-11    left shoulder with retained pins  . Knee arthroscopy 08-20-11    bil. knee scopes-torn ligament  . Cystoscopy w/ ureteral stent placement 08/21/2011    Procedure: CYSTOSCOPY WITH RETROGRADE PYELOGRAM/URETERAL STENT PLACEMENT;  Surgeon: Milford Cage, MD;  Location: [facility];  Service: Urology;  Laterality: Left;  . Cystoscopy with biopsy 08/21/2011    Procedure: CYSTOSCOPY WITH BIOPSY;  Surgeon: Milford Cage, MD;  Location: [facility];  Service: Urology;  Laterality: N/A;  . Transurethral resection of bladder tumor 08/21/2011    Procedure: TRANSURETHRAL RESECTION OF BLADDER TUMOR (TURBT);  Surgeon: Milford Cage, MD;  Location: [facility];  Service: Urology;  Laterality: N/A;  need PK gyrus  . Cystectomy 10/07/2011    Procedure: CYSTECTOMY COMPLETE;  Surgeon: Milford Cage, MD;  Location: [facility];  Service: Urology;  Laterality: Bilateral;  Radical Cystoprostatectomy Bilateral Pelvic Lymph Node Dissection Formation Of Ileal Conduit   . Prostatectomy 10/07/2011    Procedure: PROSTATECTOMY;  Surgeon: Milford Cage, MD;  Location: [facility];  Service: Urology;  Laterality: N/A;  . Lymph node  dissection 10/07/2011    Procedure: LYMPH NODE DISSECTION;  Surgeon: Milford Cage, MD;  Location: [facility];  Service: Urology;  Laterality: Bilateral;    Related Meds: protonix,   Ht: 5\' 8"  (172.7 cm)  Wt: 149 lb 14.6 oz (68 kg)  Ideal Wt: 70 kg % Ideal Wt: 97  Usual Wt: 180# 3 months ago per pt Wt Readings from Last 3 Encounters:  12/09/11 149 lb 14.6 oz (68 kg)  11/21/11 161 lb 2.5 oz (73.1 kg)  10/09/11 176 lb 2.4 oz (79.9 kg)   % Usual Wt: 83 of weight 3 months ago, 93% of weight 2 months ago  Body mass index is 22.79 kg/(m^2).  Food/Nutrition Related Hx: Regular diet prior to admit but with decreased intake for the past 6 months and significant weight loss as above.  Drank 4 Clear liquid Boosts daily prior to admit.  Clear liquid diet for the past week prior to admit.  Labs:  CMP     Component Value Date/Time   NA 144 12/10/2011 0447   K 3.8 12/10/2011 0447   CL 109 12/10/2011 0447   CO2 24 12/10/2011 0447   GLUCOSE 134* 12/10/2011 0447   BUN 31* 12/10/2011 0447   CREATININE 1.96* 12/10/2011 0447   CALCIUM 9.0 12/10/2011 0447   PROT 8.0 12/09/2011 0042   ALBUMIN 4.1 12/09/2011 0042   AST 14 12/09/2011 0042   ALT 14 12/09/2011 0042   ALKPHOS 76 12/09/2011 0042   BILITOT 0.6 12/09/2011 0042   GFRNONAA 35* 12/10/2011 0447  GFRAA 41* 12/10/2011 0447    I/O last 3 completed shifts: In: 929.7 [I.V.:917.7; IV Piggyback:12] Out: 1450 [Urine:600; Emesis/NG output:100; Other:750]   Diet Order: NPO  Supplements/Tube Feeding:none  IVF:    dextrose 5 % and 0.45 % NaCl with KCl 40 mEq/L Last Rate: 50 mL/hr (12/10/11 0935)   Pt with NG tube in place.  Passing gas but no BM.  Estimated Nutritional Needs:per day   Kcal: 1900-2100 Protein: 90-105g Fluid: >1.9L  NUTRITION DIAGNOSIS: -Inadequate oral intake (NI-2.1).  Status: Ongoing -Severe Malnutrition (Chronic) AEB 17% weight loss over the last 3 months and intake <75% estimated needs per pt hx.  RELATED TO: altered GI function  AS  EVIDENCE BY: NPO status  MONITORING/EVALUATION(Goals): Plan of care, diet advancement, weight, labs  EDUCATION NEEDS: -No education needs identified at this time  INTERVENTION: Consider TPN if unable to advance diet soon secondary to malnutrition on admission. - Order Resource Breeze 4 x daily when diet advanced  Dietitian [phone]  DOCUMENTATION CODES Per approved criteria  -Severe malnutrition in the context of chronic illness    Derrell Lolling Anastasia Fiedler 12/10/2011, 2:06 PM

## 2011-12-10 NOTE — Progress Notes (Signed)
Patient seen, examined and case discussed with my nurse practitioner. Agree with above note. He seems to be much improved from the previous day. He passed a large amount of flatus. X-ray films show continued volvulus, with contrast and non-dilated bowel and air in colon. Hopefully this will improve. He is tolerating ice chips. Management as per surgery is to continue NG tube. His blood pressure is significantly improving, likely in part to mild improvement of transitional care plus decreased pain. Continue to monitor renal function.

## 2011-12-10 NOTE — Progress Notes (Signed)
Reviewed xrays with radiologist - contrast in non dilated bowel. Lots of air in colon. Hopefully will improve. Agree with A&P per [facility]

## 2011-12-10 NOTE — Progress Notes (Signed)
   CARE MANAGEMENT NOTE 12/10/2011  Patient:  [facility] A   Account Number:  [id number]  Date Initiated:  12/10/2011  Documentation initiated by:  Lanier Clam  Subjective/Objective Assessment:   ADMITTED W/VOLVULUS OF SMALL INTESTINE.ZO:XWRUEAV/WUJWJXBJ RESECTION,ILEAL CONDUIT-2/ 2013.     Action/Plan:   FROM HOME ALONE. ACTIVE W/HHRN/PT.   Anticipated DC Date:  12/13/2011   Anticipated DC Plan:  HOME W HOME HEALTH SERVICES         [facility] Choice  Resumption Of Svcs/PTA Provider   Choice offered to / List presented to:             Status of service:  In process, will continue to follow Medicare Important Message given?   (If response is "NO", the following Medicare IM given date fields will be blank) Date Medicare IM given:   Date Additional Medicare IM given:    Discharge Disposition:    Per UR Regulation:  Reviewed for med. necessity/level of care/duration of stay  If discussed at Long Length of Stay Meetings, dates discussed:    Comments:  12/10/11 Jadalynn Burr RN,BSN NCM [phone] CONTACTED [facility])ABOUT RESUMPTION HHRN/PT,?D/C IN AM.

## 2011-12-10 NOTE — Progress Notes (Signed)
   CARE MANAGEMENT NOTE 12/10/2011  Patient:  [facility] A   Account Number:  [id number]  Date Initiated:  12/10/2011  Documentation initiated by:  Lanier Clam  Subjective/Objective Assessment:   ADMITTED W/VOLVULUS OF SMALL INTESTINE.ZO:XWRUEAV/WUJWJXBJ RESECTION,ILEAL CONDUIT-2/ 2013.     Action/Plan:   FROM HOME ALONE.   Anticipated DC Date:  12/13/2011   Anticipated DC Plan:  HOME/SELF CARE         Choice offered to / List presented to:             Status of service:   Medicare Important Message given?   (If response is "NO", the following Medicare IM given date fields will be blank) Date Medicare IM given:   Date Additional Medicare IM given:    Discharge Disposition:    Per UR Regulation:  Reviewed for med. necessity/level of care/duration of stay  If discussed at Long Length of Stay Meetings, dates discussed:    Comments:  12/10/11 [facility] RN,BSN NCM [phone]

## 2011-12-10 NOTE — Progress Notes (Signed)
Urology Progress Note  Subjective:     No acute urologic events overnight. Patient states he had greatly reduced pain overnight and this morning. No nausea. Positive flatus. No BM.  ROS: Chest pain  Objective:  Patient Vitals for the past 24 hrs:  BP Temp Temp src Pulse Resp SpO2 Height Weight  12/10/11 0630 106/75 mmHg 98.2 F (36.8 C) Oral 122  18  98 % - -  12/10/11 0458 104/76 mmHg 99.2 F (37.3 C) Oral 118  18  97 % - -  12/10/11 0214 126/93 mmHg 98.3 F (36.8 C) Oral 111  20  100 % - -  12/10/11 0011 143/106 mmHg - - 123  - - - -  12/09/11 2134 131/94 mmHg 97.5 F (36.4 C) Oral 121  20  98 % - -  12/09/11 1700 165/135 mmHg - - - - - - -  12/09/11 1500 157/122 mmHg 97.9 F (36.6 C) Oral 104  18  - - -  12/09/11 1045 133/94 mmHg 98.5 F (36.9 C) Oral 114  20  100 % 5\' 8"  (1.727 m) 68 kg (149 lb 14.6 oz)  12/09/11 1000 142/112 mmHg - - 117  19  100 % - -  12/09/11 0900 146/112 mmHg - - 124  15  100 % - -  12/09/11 0848 147/131 mmHg - - 109  16  100 % - -    Physical Exam: General:  No acute distress, awake Cardiovascular:    [x]   S1/S2 present, Mild tachycardia  []   Irregularly irregular Chest:  CTA-B Abdomen:  Soft. Mild epigastric TTP. No rebound TTP. Positive bowel sounds.             Genitourinary: Urostomy stoma pink with clear urine.    I/O last 3 completed shifts: In: 655.8 [I.V.:643.8; IV Piggyback:12] Out: 1450 [Urine:600; Emesis/NG output:100; Other:750]  Recent Labs  [facility] 12/10/11 0447 12/09/11 0042   HGB 13.7 13.8   WBC 3.7* 7.9   PLT 302 275    Recent Labs  Basename 12/10/11 0447 12/09/11 0042   NA 144 146*   K 3.8 3.6   CL 109 107   CO2 24 24   BUN 31* 23   CREATININE 1.96* 1.89*   CALCIUM 9.0 9.7   GFRNONAA 35* 37*   GFRAA 41* 43*     No results found for this basename: PT:2,INR:2,APTT:2 in the last 72 hours   No components found with this basename: ABG:2   Length of stay: 1 days.  Assessment: Partial small bowel  obstruction with possible volvulus  Plan: -Continue NG tube and NPO -KUB this morning.   Natalia Leatherwood, MD [phone]

## 2011-12-11 ENCOUNTER — Inpatient Hospital Stay (HOSPITAL_COMMUNITY): Payer: Medicare Other

## 2011-12-11 LAB — BASIC METABOLIC PANEL
BUN: 43 mg/dL — ABNORMAL HIGH (ref 6–23)
CO2: 28 mEq/L (ref 19–32)
Calcium: 8.9 mg/dL (ref 8.4–10.5)
Chloride: 112 mEq/L (ref 96–112)
Creatinine, Ser: 2.27 mg/dL — ABNORMAL HIGH (ref 0.50–1.35)
GFR calc Af Amer: 34 mL/min — ABNORMAL LOW (ref >90)

## 2011-12-11 LAB — CBC
HCT: 33.2 % — ABNORMAL LOW (ref 39.0–52.0)
MCH: 28.8 pg (ref 26.0–34.0)
MCV: 88.5 fL (ref 78.0–100.0)
RDW: 15.3 % (ref 11.5–15.5)
WBC: 5.5 10*3/uL (ref 4.0–10.5)

## 2011-12-11 MED ORDER — SODIUM CHLORIDE 0.9 % IV SOLN
INTRAVENOUS | Status: DC
  Administered 2011-12-11: 125 mL/h via INTRAVENOUS
  Administered 2011-12-11 – 2011-12-14 (×6): via INTRAVENOUS

## 2011-12-11 MED ORDER — HYDRALAZINE HCL 20 MG/ML IJ SOLN
10.0000 mg | Freq: Three times a day (TID) | INTRAMUSCULAR | Status: DC
  Filled 2011-12-11 (×3): qty 0.5

## 2011-12-11 NOTE — Progress Notes (Signed)
Volvulus of small intestine  Subjective: Sleepy this am, no apparent pain  Objective: Vital signs in last 24 hours: Temp:  [97.7 F (36.5 C)-99.6 F (37.6 C)] 98.4 F (36.9 C) (04/10 0615) Pulse Rate:  [79-123] 86  (04/10 0615) Resp:  [12-18] 16  (04/10 0615) BP: (94-126)/(61-93) 115/83 mmHg (04/10 0615) SpO2:  [98 %-100 %] 100 % (04/10 0615) Last BM Date: 12/07/11  Intake/Output from previous day: 04/09 0701 - 04/10 0700 In: 600 [I.V.:600] Out: 1225 [Urine:1075; Emesis/NG output:150] Intake/Output this shift:    General appearance: no distress GI: soft, non-tender; bowel sounds normal; no masses,  no organomegaly  Lab Results:  Results for orders placed during the hospital encounter of 12/09/11 (from the past 24 hour(s))  CBC     Status: Abnormal   Collection Time   12/11/11  4:50 AM      Component Value Range   WBC 5.5  4.0 - 10.5 (K/uL)   RBC 3.75 (*) 4.22 - 5.81 (MIL/uL)   Hemoglobin 10.8 (*) 13.0 - 17.0 (g/dL)   HCT 82.9 (*) 56.2 - 52.0 (%)   MCV 88.5  78.0 - 100.0 (fL)   MCH 28.8  26.0 - 34.0 (pg)   MCHC 32.5  30.0 - 36.0 (g/dL)   RDW 13.0  86.5 - 78.4 (%)   Platelets 247  150 - 400 (K/uL)  BASIC METABOLIC PANEL     Status: Abnormal   Collection Time   12/11/11  4:50 AM      Component Value Range   Sodium 147 (*) 135 - 145 (mEq/L)   Potassium 4.1  3.5 - 5.1 (mEq/L)   Chloride 112  96 - 112 (mEq/L)   CO2 28  19 - 32 (mEq/L)   Glucose, Bld 113 (*) 70 - 99 (mg/dL)   BUN 43 (*) 6 - 23 (mg/dL)   Creatinine, Ser 6.96 (*) 0.50 - 1.35 (mg/dL)   Calcium 8.9  8.4 - 29.5 (mg/dL)   GFR calc non Af Amer 29 (*) >90 (mL/min)   GFR calc Af Amer 34 (*) >90 (mL/min)     Studies/Results Radiology     MEDS, Scheduled    . hydrALAZINE  10 mg Intravenous Q8H  . metoprolol  5 mg Intravenous Q6H  . pantoprazole (PROTONIX) IV  40 mg Intravenous Q24H  . DISCONTD: cloNIDine  0.2 mg Transdermal Weekly  . DISCONTD: hydrALAZINE  10 mg Intravenous Q6H      Assessment: Volvulus of small intestine Appears improved. Abd xrays pending.  Plan: Will review films. Maybe able to take NG out and start clears  LOS: 2 days    Currie Paris, MD, [facility], [place] [phone]   12/11/2011 8:15 AM

## 2011-12-11 NOTE — Progress Notes (Signed)
Pt had 10 beats of V. Tach on heart monitor, asymptomatic, VSS- 136/93, 80, 18, 98.7, 100% RA.  Merdis Delay, NP notified.  No new orders at this time. Will continue to monitor. Newman Nip Essex

## 2011-12-11 NOTE — Progress Notes (Signed)
Urology Progress Note  Subjective:     No acute urologic events overnight. Patient denies abdominal pain today. No nausea. Positive flatus. Still no BM.  ROS: Negative: Chest pain  Objective:  Patient Vitals for the past 24 hrs:  BP Temp Temp src Pulse Resp SpO2  12/11/11 0615 115/83 mmHg 98.4 F (36.9 C) Oral 86  16  100 %  12/11/11 0216 94/61 mmHg - - 79  18  99 %  12/11/11 0014 106/77 mmHg 99.6 F (37.6 C) Oral 97  18  100 %  12/10/11 2032 119/84 mmHg 98.6 F (37 C) Oral 91  16  100 %  12/10/11 1813 108/74 mmHg 99.3 F (37.4 C) Oral 116  - -  12/10/11 1445 126/93 mmHg 99.6 F (37.6 C) Oral 102  18  99 %  12/10/11 1123 126/91 mmHg - - 90  - -  12/10/11 1033 122/76 mmHg 97.7 F (36.5 C) Oral 123  13  98 %  12/10/11 0935 121/82 mmHg 98 F (36.7 C) Oral 109  12  98 %    Physical Exam: General:  No acute distress, awake Cardiovascular:    [x]   S1/S2 present,No tachycardia  []   Irregularly irregular Chest:  CTA-B Abdomen:  Soft. Mild epigastric TTP. No rebound TTP. Positive bowel sounds.             Genitourinary: Urostomy stoma pink with clear urine.    I/O last 3 completed shifts: In: 273.9 [I.V.:273.9] Out: 1925 [Urine:1675; Emesis/NG output:250]  Recent Labs  Basename 12/11/11 0450 12/10/11 0447   HGB 10.8* 13.7   WBC 5.5 3.7*   PLT 247 302    Recent Labs  Basename 12/11/11 0450 12/10/11 0447   NA 147* 144   K 4.1 3.8   CL 112 109   CO2 28 24   BUN 43* 31*   CREATININE 2.27* 1.96*   CALCIUM 8.9 9.0   GFRNONAA 29* 35*   GFRAA 34* 41*     No results found for this basename: PT:2,INR:2,APTT:2 in the last 72 hours   No components found with this basename: ABG:2   Length of stay: 2 days.  Assessment: Partial small bowel obstruction with possible volvulus  Plan: -Continue NG tube and NPO. May consider PO challenge soon depending upon results of X-ray. -Abdominal x-ray pending this morning.   Natalia Leatherwood, MD [phone]

## 2011-12-11 NOTE — Progress Notes (Signed)
Subjective: Denies pain/discomfort. No events during night. Reports "I feel a whole lot better today!"  Objective: Vital signs Filed Vitals:   12/10/11 2032 12/11/11 0014 12/11/11 0216 12/11/11 0615  BP: 119/84 106/77 94/61 115/83  Pulse: 91 97 79 86  Temp: 98.6 F (37 C) 99.6 F (37.6 C)  98.4 F (36.9 C)  TempSrc: Oral Oral  Oral  Resp: 16 18 18 16   Height:      Weight:      SpO2: 100% 100% 99% 100%   Weight change:  Last BM Date: 12/07/11  Intake/Output from previous day: 04/09 0701 - 04/10 0700 In: 600 [I.V.:600] Out: 1225 [Urine:1075; Emesis/NG output:150]     Physical Exam: General: Alert, awake, oriented x3, in no acute distress. HEENT: No bruits, no goiter. PERRL, EOMI Heart: Regular rate and rhythm, without murmurs, rubs, gallops. No LEE Lungs: Normal effort slightly shallow. Breath sounds clear to auscultation bilaterally. No wheeze Abdomen: Soft, nontender, nondistended, positive bowel sounds but sluggish. NG intact draining moderate amount light greenish drainage. Urostomy with pink stoma, draining clear urine Extremities: No clubbing cyanosis or edema with positive pedal pulses. Neuro: Grossly intact, nonfocal.    Lab Results: Basic Metabolic Panel:  Basename 12/11/11 0450 12/10/11 0447  NA 147* 144  K 4.1 3.8  CL 112 109  CO2 28 24  GLUCOSE 113* 134*  BUN 43* 31*  CREATININE 2.27* 1.96*  CALCIUM 8.9 9.0  MG -- --  PHOS -- --   Liver Function Tests:  Basename 12/09/11 0042  AST 14  ALT 14  ALKPHOS 76  BILITOT 0.6  PROT 8.0  ALBUMIN 4.1   No results found for this basename: LIPASE:2,AMYLASE:2 in the last 72 hours No results found for this basename: AMMONIA:2 in the last 72 hours CBC:  Basename 12/11/11 0450 12/10/11 0447 12/09/11 0042  WBC 5.5 3.7* --  NEUTROABS -- -- 6.7  HGB 10.8* 13.7 --  HCT 33.2* 40.7 --  MCV 88.5 87.3 --  PLT 247 302 --   Cardiac Enzymes: No results found for this basename:  CKTOTAL:3,CKMB:3,CKMBINDEX:3,TROPONINI:3 in the last 72 hours BNP: No results found for this basename: PROBNP:3 in the last 72 hours D-Dimer: No results found for this basename: DDIMER:2 in the last 72 hours CBG: No results found for this basename: GLUCAP:6 in the last 72 hours Hemoglobin A1C: No results found for this basename: HGBA1C in the last 72 hours Fasting Lipid Panel: No results found for this basename: CHOL,HDL,LDLCALC,TRIG,CHOLHDL,LDLDIRECT in the last 72 hours Thyroid Function Tests: No results found for this basename: TSH,T4TOTAL,FREET4,T3FREE,THYROIDAB in the last 72 hours Anemia Panel: No results found for this basename: VITAMINB12,FOLATE,FERRITIN,TIBC,IRON,RETICCTPCT in the last 72 hours Coagulation: No results found for this basename: LABPROT:2,INR:2 in the last 72 hours Urine Drug Screen: Drugs of Abuse  No results found for this basename: labopia, cocainscrnur, labbenz, amphetmu, thcu, labbarb    Alcohol Level: No results found for this basename: ETH:2 in the last 72 hours Urinalysis:  Basename 12/09/11 0220  COLORURINE YELLOW  LABSPEC 1.015  PHURINE 7.0  GLUCOSEU NEGATIVE  HGBUR TRACE*  BILIRUBINUR NEGATIVE  KETONESUR NEGATIVE  PROTEINUR >300*  UROBILINOGEN 1.0  NITRITE NEGATIVE  LEUKOCYTESUR SMALL*   Misc. Labs:  No results found for this or any previous visit (from the past 240 hour(s)).  Studies/Results: Dg Abd 1 View  12/10/2011  *RADIOLOGY REPORT*  Clinical Data: Small bowel obstruction.  ABDOMEN - 1 VIEW  Comparison: 12/10/2011  Findings: NG tube remains coiled in the stomach.  No air fluid levels on decubitus view.  Continued small bowel prominence and air- fluid levels, not significantly changed.  IMPRESSION: No significant change.  No free air on decubitus view.  Original Report Authenticated By: Cyndie Chime, M.D.   Dg Abd 1 View  12/10/2011  *RADIOLOGY REPORT*  Clinical Data: Follow-up small bowel obstruction  ABDOMEN - 1 VIEW  Comparison:  12/09/2011  Findings: Mildly prominent loops of small bowel in the mid abdomen, similar versus mildly improved when compared to topogram from prior CT.  Contrast within a loop of nondilated small bowel in the left mid abdomen.  Visualized portions of the cecum, proximal transverse colon, and rectum remain unopacified but are not decompressed.  Surgical clips in the pelvis.  Enteric tube looped in the gastric fundus.  IMPRESSION: Mildly prominent loops of small bowel in the mid abdomen, similar versus mildly improved when compared to prior CT.  Original Report Authenticated By: Charline Bills, M.D.   Dg Abd 2 Views  12/11/2011  *RADIOLOGY REPORT*  Clinical Data: Small bowel obstruction  ABDOMEN - 2 VIEW  Comparison: 12/10/2011  Findings: Contrast material from recent CT scan noted throughout the colon.  There is a NG tube with tip coiled within stomach. There is nonspecific nonobstructive bowel gas pattern.  No free air is noted on decubitus view.  IMPRESSION: Nonspecific nonobstructive bowel gas pattern.  Contrast material from recent CT scan noted throughout the colon.  NG tube with tip coiled within stomach.  No free abdominal air.  Original Report Authenticated By: Natasha Mead, M.D.    Medications: Scheduled Meds:   . hydrALAZINE  10 mg Intravenous Q8H  . metoprolol  5 mg Intravenous Q6H  . pantoprazole (PROTONIX) IV  40 mg Intravenous Q24H  . DISCONTD: hydrALAZINE  10 mg Intravenous Q6H   Continuous Infusions:   . dextrose 5 % and 0.45 % NaCl with KCl 40 mEq/L 20 mL/hr (12/11/11 0710)   PRN Meds:.chlorproMAZINE, morphine injection, ondansetron (ZOFRAN) IV  Assessment/Plan:  Principal Problem:  *Volvulus of small intestine Active Problems:  HYPERLIPIDEMIA  HYPERTENSION  S/P radical cystoprostatectomy  Attention to urostomy  H/O: CVA (cardiovascular accident)  Acute renal failure (ARF)  Dehydration 1. Volvulus of small intestine - seems partially obstructed and not high grade SBO .  Repeat xray shows non obstructive gas pattern. Surgery following.  Continue bowel rest, ng tube and symptomatic management. On PPI.  2. ARI/Dehydration - due to 3rd space losses in the SB in setting of CKD III - creatinine  2.27>1.9>1.8 . Iv fluids increased yesterday to 50cc.. Volume status -1100cc for hospitalization.  Will increase fluids to 125. Monitor.  3. Mild Hypernatremia: likely related to #2.   Monitor 3. Tacycardia: likely related to #2. Resolved. HR range 79-97. Temp 99.2 Monitor closely  4. Hypertension: better controlled, DC IV hydralazine, continue lopressor,  SBP range 94-119.   5. Further plans depending on how situation evolves  6. Dispo: home once medically stable.    LOS: 2 days   [facility] M 12/11/2011, 10:20 AM  Pt seen and examined, agree with Excellent note by [facility] NP Increase IVF, bmet in am Mgt of SBO/Volvulus per [facility]  Zannie Cove [facility]  [phone]

## 2011-12-12 ENCOUNTER — Inpatient Hospital Stay (HOSPITAL_COMMUNITY): Payer: Medicare Other

## 2011-12-12 LAB — BASIC METABOLIC PANEL
BUN: 33 mg/dL — ABNORMAL HIGH (ref 6–23)
Creatinine, Ser: 1.81 mg/dL — ABNORMAL HIGH (ref 0.50–1.35)
GFR calc Af Amer: 45 mL/min — ABNORMAL LOW (ref >90)
GFR calc non Af Amer: 39 mL/min — ABNORMAL LOW (ref >90)

## 2011-12-12 LAB — CBC
HCT: 29.7 % — ABNORMAL LOW (ref 39.0–52.0)
MCHC: 32 g/dL (ref 30.0–36.0)
MCV: 89.7 fL (ref 78.0–100.0)
RDW: 15.3 % (ref 11.5–15.5)

## 2011-12-12 LAB — MAGNESIUM: Magnesium: 1.4 mg/dL — ABNORMAL LOW (ref 1.5–2.5)

## 2011-12-12 MED ORDER — METOPROLOL TARTRATE 25 MG PO TABS
25.0000 mg | ORAL_TABLET | Freq: Two times a day (BID) | ORAL | Status: DC
  Administered 2011-12-12 – 2011-12-14 (×5): 25 mg via ORAL
  Filled 2011-12-12 (×6): qty 1

## 2011-12-12 MED ORDER — MAGNESIUM SULFATE 40 MG/ML IJ SOLN
2.0000 g | Freq: Once | INTRAMUSCULAR | Status: AC
  Administered 2011-12-12: 2 g via INTRAVENOUS
  Filled 2011-12-12: qty 50

## 2011-12-12 NOTE — Progress Notes (Signed)
Urology Progress Note  Subjective:     No acute urologic events overnight. No abdominal pain. Positive flatus. No BM. Negative nausea since removal of NG.  ROS: Negative: Chest pain  Objective:  Patient Vitals for the past 24 hrs:  BP Temp Temp src Pulse Resp SpO2  12/12/11 0606 144/85 mmHg 99.1 F (37.3 C) Oral 69  16  97 %  12/12/11 0017 136/93 mmHg - - 83  - -  12/11/11 2121 136/93 mmHg 98.7 F (37.1 C) Oral 80  18  100 %  12/11/11 1749 141/95 mmHg - - - - -  12/11/11 1359 135/92 mmHg 98.4 F (36.9 C) Oral 78  14  99 %  12/11/11 1145 135/92 mmHg - - - - -    Physical Exam: General:  No acute distress, awake Cardiovascular:    [x]   S1/S2 present, RRR  []   Irregularly irregular Chest:  CTA-B Abdomen:  Soft. Mild epigastric TTP. No rebound TTP. Positive bowel sounds.             Genitourinary: Urostomy stoma pink with clear urine.    I/O last 3 completed shifts: In: 1200 [P.O.:600; I.V.:600] Out: 2150 [Urine:1850; Emesis/NG output:300]  Recent Labs  [facility] 12/12/11 0444 12/11/11 0450   HGB 9.5* 10.8*   WBC 6.5 5.5   PLT 216 247    Recent Labs  Basename 12/12/11 0444 12/11/11 0450   NA 144 147*   K 3.7 4.1   CL 112 112   CO2 26 28   BUN 33* 43*   CREATININE 1.81* 2.27*   CALCIUM 8.1* 8.9   GFRNONAA 39* 29*   GFRAA 45* 34*     No results found for this basename: PT:2,INR:2,APTT:2 in the last 72 hours   No components found with this basename: ABG:2   Length of stay: 3 days.  Assessment: Partial small bowel obstruction with possible volvulus  Plan: -Patient heading in the right direction. Question whether patient will need further follow up imaging as an outpatient. -Repeat abdominal x-ray this morning. -Consider ordering PT/OT.   Natalia Leatherwood, MD [phone]

## 2011-12-12 NOTE — Progress Notes (Signed)
Subjective: Feels great, tolerating clears, +flatus, no BM yet  Objective: Vital signs Filed Vitals:   12/11/11 1749 12/11/11 2121 12/13/2011 0017 12/13/11 0606  BP: 141/95 136/93 136/93 144/85  Pulse:  80 83 69  Temp:  98.7 F (37.1 C)  99.1 F (37.3 C)  TempSrc:  Oral  Oral  Resp:  18  16  Height:      Weight:      SpO2:  100%  97%   Weight change:  Last BM Date: 12/07/11  Intake/Output from previous day: 04/10 0701 - 12/13/2022 0700 In: 2100 [P.O.:600; I.V.:1500] Out: 1925 [Urine:1775; Emesis/NG output:150]     Physical Exam: General: Alert, awake, oriented x3, in no acute distress. HEENT: No bruits, no goiter. PERRL, EOMI Heart: Regular rate and rhythm, without murmurs, rubs, gallops. No LEE Lungs: Normal effort slightly shallow. Breath sounds clear to auscultation bilaterally. No wheeze Abdomen: Soft, nontender, nondistended, positive bowel sounds .  Urostomy with pink stoma, draining clear urine Extremities: No clubbing cyanosis or edema with positive pedal pulses. Neuro: Grossly intact, nonfocal.    Lab Results: Basic Metabolic Panel:  Basename December 13, 2011 0444 12/11/11 0450  NA 144 147*  K 3.7 4.1  CL 112 112  CO2 26 28  GLUCOSE 89 113*  BUN 33* 43*  CREATININE 1.81* 2.27*  CALCIUM 8.1* 8.9  MG 1.4* --  PHOS -- --   Liver Function Tests: No results found for this basename: AST:2,ALT:2,ALKPHOS:2,BILITOT:2,PROT:2,ALBUMIN:2 in the last 72 hours No results found for this basename: LIPASE:2,AMYLASE:2 in the last 72 hours No results found for this basename: AMMONIA:2 in the last 72 hours CBC:  Basename 12/13/11 0444 12/11/11 0450  WBC 6.5 5.5  NEUTROABS -- --  HGB 9.5* 10.8*  HCT 29.7* 33.2*  MCV 89.7 88.5  PLT 216 247   Cardiac Enzymes: No results found for this basename: CKTOTAL:3,CKMB:3,CKMBINDEX:3,TROPONINI:3 in the last 72 hours BNP: No results found for this basename: PROBNP:3 in the last 72 hours D-Dimer: No results found for this basename:  DDIMER:2 in the last 72 hours CBG: No results found for this basename: GLUCAP:6 in the last 72 hours Hemoglobin A1C: No results found for this basename: HGBA1C in the last 72 hours Fasting Lipid Panel: No results found for this basename: CHOL,HDL,LDLCALC,TRIG,CHOLHDL,LDLDIRECT in the last 72 hours Thyroid Function Tests: No results found for this basename: TSH,T4TOTAL,FREET4,T3FREE,THYROIDAB in the last 72 hours Anemia Panel: No results found for this basename: VITAMINB12,FOLATE,FERRITIN,TIBC,IRON,RETICCTPCT in the last 72 hours Coagulation: No results found for this basename: LABPROT:2,INR:2 in the last 72 hours Urine Drug Screen: Drugs of Abuse  No results found for this basename: labopia,  cocainscrnur,  labbenz,  amphetmu,  thcu,  labbarb    Alcohol Level: No results found for this basename: ETH:2 in the last 72 hours Urinalysis: No results found for this basename: COLORURINE:2,APPERANCEUR:2,LABSPEC:2,PHURINE:2,GLUCOSEU:2,HGBUR:2,BILIRUBINUR:2,KETONESUR:2,PROTEINUR:2,UROBILINOGEN:2,NITRITE:2,LEUKOCYTESUR:2 in the last 72 hours Misc. Labs:  No results found for this or any previous visit (from the past 240 hour(s)).  Studies/Results: Dg Abd 2 Views  13-Dec-2011  *RADIOLOGY REPORT*  Clinical Data: Evaluate small bowel obstruction  ABDOMEN - 2 VIEW  Comparison: 12/11/2011  Findings: Interval removal of the NG tube.  Gas filled loops of small bowel which are not pathologically dilated and reduced in size from prior.  There is oral contrast within the colon and rectum.  There is gas in the rectum.  IMPRESSION: Improvement in  small bowel obstruction pattern.  Removal of NG tube  Original Report Authenticated By: Genevive Bi, M.D.  Dg Abd 2 Views  12/11/2011  *RADIOLOGY REPORT*  Clinical Data: Small bowel obstruction  ABDOMEN - 2 VIEW  Comparison: 12/10/2011  Findings: Contrast material from recent CT scan noted throughout the colon.  There is a NG tube with tip coiled within stomach.  There is nonspecific nonobstructive bowel gas pattern.  No free air is noted on decubitus view.  IMPRESSION: Nonspecific nonobstructive bowel gas pattern.  Contrast material from recent CT scan noted throughout the colon.  NG tube with tip coiled within stomach.  No free abdominal air.  Original Report Authenticated By: Natasha Mead, M.D.    Medications: Scheduled Meds:    . metoprolol  5 mg Intravenous Q6H  . pantoprazole (PROTONIX) IV  40 mg Intravenous Q24H  . DISCONTD: hydrALAZINE  10 mg Intravenous Q8H   Continuous Infusions:    . sodium chloride 125 mL/hr at 12/12/11 0559  . DISCONTD: dextrose 5 % and 0.45 % NaCl with KCl 40 mEq/L 20 mL/hr (12/11/11 0710)   PRN Meds:.chlorproMAZINE, morphine injection, ondansetron (ZOFRAN) IV  Assessment/Plan:  Principal Problem:  *Volvulus of small intestine Active Problems:  HYPERLIPIDEMIA  HYPERTENSION  S/P radical cystoprostatectomy  Attention to urostomy  H/O: CVA (cardiovascular accident)  Acute renal failure (ARF)  Dehydration 1. SBO/Volvulus of small intestine - seems partially obstructed and not high grade SBO . Repeat xray shows  Improvement in gas pattern. Surgery following. Advance to full liquid diet.  2. ARI/Dehydration - due to 3rd space losses in the SB in setting of CKD III - improving slowly, cut down IVF 3. Mild Hypernatremia: likely related to #2.   Monitor 3. Tacycardia: likely related to #2. Resolved. HR range 79-97. Temp 99.2 Monitor closely  4. Hypertension: better controlled, DC IV hydralazine, continue lopressor,change to PO.   5. Further plans depending on how situation evolves  6. Dispo: home possibly tomorrow PT eval/ambulate    LOS: 3 days   Persais Ethridge 12/12/2011, 9:32 AM

## 2011-12-12 NOTE — Evaluation (Signed)
Physical Therapy Evaluation Patient Details Name: William Collier MRN: [id number] DOB: [birth year] Today's Date: 12/12/2011  Problem List:  Patient Active Problem List  Diagnoses  . HYPERLIPIDEMIA  . HYPERTENSION  . INTERNAL HEMORRHOIDS  . DIVERTICULOSIS, COLON  . ONYCHIA AND PARONYCHIA OF TOE  . DEGENERATIVE JOINT DISEASE  . SHOULDER PAIN, LEFT  . LEG PAIN, LEFT  . S/P radical cystoprostatectomy  . Volvulus of small intestine  . Attention to urostomy  . H/O: CVA (cardiovascular accident)  . Acute renal failure (ARF)  . Dehydration    Past Medical History:  Past Medical History  Diagnosis Date  . Hypertension 08-20-11  . Dysrhythmia 08-20-11    skip beat, hx. RBBB  . GERD (gastroesophageal reflux disease) 08-20-11    controls with diet, hx. colon polyps(benign)  . Cancer 08-20-11    bladder cancer  . Arthritis   . Stroke 08-20-11    '08-Lt. sided weakness with residual > lt. arm.  . Chronic kidney disease     CKD stage III   Past Surgical History:  Past Surgical History  Procedure Date  . Bladder surgery 08-20-11    07-01-11 bladder surgery, past hx. lithotripsy  . Shoulder arthrotomy 08-20-11    left shoulder with retained pins  . Knee arthroscopy 08-20-11    bil. knee scopes-torn ligament  . Cystoscopy w/ ureteral stent placement 08/21/2011    Procedure: CYSTOSCOPY WITH RETROGRADE PYELOGRAM/URETERAL STENT PLACEMENT;  Surgeon: Milford Cage, MD;  Location: [facility];  Service: Urology;  Laterality: Left;  . Cystoscopy with biopsy 08/21/2011    Procedure: CYSTOSCOPY WITH BIOPSY;  Surgeon: Milford Cage, MD;  Location: [facility];  Service: Urology;  Laterality: N/A;  . Transurethral resection of bladder tumor 08/21/2011    Procedure: TRANSURETHRAL RESECTION OF BLADDER TUMOR (TURBT);  Surgeon: Milford Cage, MD;  Location: [facility];  Service: Urology;  Laterality: N/A;  need PK gyrus  . Cystectomy 10/07/2011    Procedure: CYSTECTOMY COMPLETE;  Surgeon:  Milford Cage, MD;  Location: [facility];  Service: Urology;  Laterality: Bilateral;  Radical Cystoprostatectomy Bilateral Pelvic Lymph Node Dissection Formation Of Ileal Conduit   . Prostatectomy 10/07/2011    Procedure: PROSTATECTOMY;  Surgeon: Milford Cage, MD;  Location: [facility];  Service: Urology;  Laterality: N/A;  . Lymph node dissection 10/07/2011    Procedure: LYMPH NODE DISSECTION;  Surgeon: Milford Cage, MD;  Location: [facility];  Service: Urology;  Laterality: Bilateral;    PT Assessment/Plan/Recommendation PT Assessment Clinical Impression Statement: Patient admitted with parital bowel obstruction and presents with generalized weakness due to immobility and limited nutrition.  He will benefit from skilled PT in the acute setting to maximize independence and allow d/c back home to his apartment at mod independent level. PT Recommendation/Assessment: Patient will need skilled PT in the acute care venue PT Problem List: Decreased mobility;Decreased activity tolerance;Decreased strength PT Therapy Diagnosis : Abnormality of gait;Generalized weakness PT Plan PT Frequency: Min 3X/week PT Treatment/Interventions: DME instruction;Gait training;Functional mobility training;Therapeutic activities;Therapeutic exercise;Patient/family education PT Recommendation Follow Up Recommendations: Home health PT;No PT follow up (depending on progress) Equipment Recommended: None recommended by PT PT Goals  Acute Rehab PT Goals PT Goal Formulation: With patient Time For Goal Achievement: 7 days Pt will go Sit to Stand: with modified independence PT Goal: Sit to Stand - Progress: Goal set today Pt will go Stand to Sit: with modified independence PT Goal: Stand to Sit - Progress: Goal set today Pt will Ambulate: >150  feet;with modified independence;with least restrictive assistive device PT Goal: Ambulate - Progress: Goal set today Pt will Perform Home Exercise Program:  Independently PT Goal: Perform Home Exercise Program - Progress: Goal set today  PT Evaluation Precautions/Restrictions  Precautions Precautions: Fall Required Braces or Orthoses: Other Brace/Splint Other Brace/Splint: Left LE AFO Prior Functioning  Home Living Lives With: Alone Available Help at Discharge: Family Type of Home: Apartment Home Access: Level entry Home Layout: One level Bathroom Shower/Tub: Engineer, manufacturing systems: Handicapped height Home Adaptive Equipment: Straight cane Additional Comments: family assists with meals at times, transportation and occasionally laundry Prior Function Level of Independence: Independent with assistive device(s) Driving: No Cognition Cognition Arousal/Alertness: Awake/alert Overall Cognitive Status: Appears within functional limits for tasks assessed Sensation/Coordination Sensation Light Touch: Appears Intact Extremity Assessment LUE Assessment LUE Assessment: Exceptions to WFL LUE PROM (degrees) LUE Overall PROM Comments: Grossly WFL with loose fingers, tight wrist flexors and tone worst at elbow LUE Strength LUE Overall Strength Comments: minimal active movement; strength non functional LUE Tone LUE Tone: Modified Ashworth Modified Ashworth Scale for Grading Hypertonia LUE: More marked increase in muscle tone through most of the ROM, but affected part(s) easily moved RLE Assessment RLE Assessment: Within Functional Limits LLE Assessment LLE Assessment: Exceptions to WFL LLE AROM (degrees) LLE Overall AROM Comments: grossly WFL, except limited AROM at ankle tight plantarflexors LLE Strength LLE Overall Strength Comments: grossly 4/5 Mobility (including Balance) Bed Mobility Bed Mobility: Yes Supine to Sit: 6: Modified independent (Device/Increase time) Sitting - Scoot to Edge of Bed: 6: Modified independent (Device/Increase time) (unable to reciprocally scoot due to left hemiparesis) Transfers Transfers:  Yes Sit to Stand: 5: Supervision Sit to Stand Details (indicate cue type and reason): for safety Stand to Sit: 5: Supervision Stand to Sit Details: for safety Ambulation/Gait Ambulation/Gait: Yes Ambulation/Gait Assistance: 5: Supervision Ambulation/Gait Assistance Details (indicate cue type and reason): cues for safety due to right foot falls behind left at times with risk for falling; patient reports he is aware and compensates with no recent falls at home Ambulation Distance (Feet): 150 Feet Assistive device: Straight cane Gait Pattern: Step-to pattern;Decreased trunk rotation;Trunk rotated posteriorly on left  Posture/Postural Control Posture/Postural Control: Postural limitations Postural Limitations: trunk rotated right with pelvis rotated left and hip internal rotation with adduction Exercise  General Exercises - Upper Extremity Shoulder Flexion: PROM;5 reps;Seated Shoulder ABduction: PROM;5 reps;Seated Elbow Flexion: PROM;5 reps;Seated Elbow Extension: PROM;5 reps;Seated Wrist Extension: PROM;5 reps;Seated End of Session PT - End of Session Equipment Utilized During Treatment: Gait belt Activity Tolerance: Patient tolerated treatment well Patient left: in chair;with call bell in reach General Behavior During Session: [facility] for tasks performed Cognition: [facility] for tasks performed Patient Class: Inpatient [facility] 12/12/2011, 12:21 PM

## 2011-12-12 NOTE — Progress Notes (Signed)
Per note of WJ,PA. He appears much improved

## 2011-12-12 NOTE — Progress Notes (Signed)
  Subjective: Feeling much better, diet has been advanced.    Objective: Vital signs in last 24 hours: Temp:  [98.4 F (36.9 C)-99.1 F (37.3 C)] 99.1 F (37.3 C) 01-Jan-2023 0606) Pulse Rate:  [69-83] 69  01-01-23 0606) Resp:  [14-18] 16  2023/01/01 0606) BP: (135-144)/(85-95) 144/85 mmHg 01-01-23 0606) SpO2:  [97 %-100 %] 97 % 01-01-2023 0606) Last BM Date: 12/07/11 No BM, NG is out,  Intake/Output from previous day: 04/10 0701 - Jan 01, 2023 0700 In: 2100 [P.O.:600; I.V.:1500] Out: 1925 [Urine:1775; Emesis/NG output:150] Intake/Output this shift: Total I/O In: 1710.4 [P.O.:1100; I.V.:610.4] Out: 575 [Urine:575]  General appearance: alert, cooperative and no distress GI: soft, non-tender; bowel sounds normal; no masses,  no organomegaly and ileostomy working well  Lab Results:   Basename Jan 01, 2012 0444 12/11/11 0450  WBC 6.5 5.5  HGB 9.5* 10.8*  HCT 29.7* 33.2*  PLT 216 247    BMET  Basename 2012/01/01 0444 12/11/11 0450  NA 144 147*  K 3.7 4.1  CL 112 112  CO2 26 28  GLUCOSE 89 113*  BUN 33* 43*  CREATININE 1.81* 2.27*  CALCIUM 8.1* 8.9   PT/INR No results found for this basename: LABPROT:2,INR:2 in the last 72 hours   Lab 12/09/11 0042  AST 14  ALT 14  ALKPHOS 76  BILITOT 0.6  PROT 8.0  ALBUMIN 4.1     Lipase  No results found for this basename: lipase     Studies/Results: Dg Abd 2 Views  Jan 01, 2012  *RADIOLOGY REPORT*  Clinical Data: Evaluate small bowel obstruction  ABDOMEN - 2 VIEW  Comparison: 12/11/2011  Findings: Interval removal of the NG tube.  Gas filled loops of small bowel which are not pathologically dilated and reduced in size from prior.  There is oral contrast within the colon and rectum.  There is gas in the rectum.  IMPRESSION: Improvement in  small bowel obstruction pattern.  Removal of NG tube  Original Report Authenticated By: Genevive Bi, M.D.   Dg Abd 2 Views  12/11/2011  *RADIOLOGY REPORT*  Clinical Data: Small bowel obstruction  ABDOMEN - 2  VIEW  Comparison: 12/10/2011  Findings: Contrast material from recent CT scan noted throughout the colon.  There is a NG tube with tip coiled within stomach. There is nonspecific nonobstructive bowel gas pattern.  No free air is noted on decubitus view.  IMPRESSION: Nonspecific nonobstructive bowel gas pattern.  Contrast material from recent CT scan noted throughout the colon.  NG tube with tip coiled within stomach.  No free abdominal air.  Original Report Authenticated By: Natasha Mead, M.D.    Medications:    . metoprolol tartrate  25 mg Oral BID  . pantoprazole (PROTONIX) IV  40 mg Intravenous Q24H  . DISCONTD: metoprolol  5 mg Intravenous Q6H    Assessment/Plan Possible SBO, Volvulus of small bowel  hx of bladder cancer s/p ileal conduit procedure in 10/2011  Hypertension  Dysrhythmia  Hx stroke  Renal Insuffiency/ARF improving    Plan:  Agree with advancing diet, increase ambulation.    LOS: 3 days    Walaa Carel 01-Jan-2012

## 2011-12-12 NOTE — Progress Notes (Signed)
   CARE MANAGEMENT NOTE 12/12/2011  Patient:  [facility]   Account Number:  [id number]  Date Initiated:  12/10/2011  Documentation initiated by:  Lanier Clam  Subjective/Objective Assessment:   ADMITTED W/VOLVULUS OF SMALL INTESTINE.AV:WUJWJXB/JYNWGNFA RESECTION,ILEAL CONDUIT-2/ 2013.     Action/Plan:   FROM HOME ALONE. ACTIVE W/HHRN/PT.   Anticipated DC Date:  12/13/2011   Anticipated DC Plan:  HOME W HOME HEALTH SERVICES         [facility] Choice  Resumption Of Svcs/PTA Provider   Choice offered to / List presented to:  C-1 Patient        HH arranged  HH-1 RN  HH-2 PT      [facility] agency  [facility]   Status of service:  In process, will continue to follow Medicare Important Message given?  YES (If response is "NO", the following Medicare IM given date fields will be blank) Date Medicare IM given:  12/09/2011 Date Additional Medicare IM given:    Discharge Disposition:    Per UR Regulation:  Reviewed for med. necessity/level of care/duration of stay  If discussed at Long Length of Stay Meetings, dates discussed:    Comments:  12/14/11 Trishelle Devora RN,BSN NCM [phone] PT-HH.[facility] FOLLOWING FOR RESUMPTION OF HHRN/PT.?D/C IN AM IF MED STABLE.  12/10/11 Jw Covin RN,BSN NCM [phone] CONTACTED [facility]  MARY(LIASON)ABOUT RESUMPTION HHRN/PT,?D/C IN AM.

## 2011-12-13 LAB — BASIC METABOLIC PANEL
CO2: 23 mEq/L (ref 19–32)
Calcium: 8.5 mg/dL (ref 8.4–10.5)
Creatinine, Ser: 1.59 mg/dL — ABNORMAL HIGH (ref 0.50–1.35)
Glucose, Bld: 90 mg/dL (ref 70–99)

## 2011-12-13 MED ORDER — OXYCODONE-ACETAMINOPHEN 5-325 MG PO TABS: 1.0000 | ORAL_TABLET | Freq: Four times a day (QID) | ORAL | Status: AC | PRN

## 2011-12-13 MED ORDER — PANTOPRAZOLE SODIUM 40 MG PO TBEC
40.0000 mg | DELAYED_RELEASE_TABLET | Freq: Every day | ORAL | Status: DC
  Administered 2011-12-13 – 2011-12-14 (×2): 40 mg via ORAL
  Filled 2011-12-13 (×2): qty 1

## 2011-12-13 MED ORDER — OMEPRAZOLE 20 MG PO CPDR: 40.0000 mg | DELAYED_RELEASE_CAPSULE | Freq: Every day | ORAL | Status: DC

## 2011-12-13 MED ORDER — METOPROLOL TARTRATE 25 MG PO TABS: 25.0000 mg | ORAL_TABLET | Freq: Two times a day (BID) | ORAL | Status: DC

## 2011-12-13 MED ORDER — OXYCODONE-ACETAMINOPHEN 5-325 MG PO TABS
1.0000 | ORAL_TABLET | ORAL | Status: DC | PRN
  Administered 2011-12-13: 1 via ORAL
  Filled 2011-12-13: qty 1

## 2011-12-13 NOTE — Progress Notes (Signed)
   CARE MANAGEMENT NOTE 12/13/2011  Patient:  [facility]   Account Number:  [id number]  Date Initiated:  12/10/2011  Documentation initiated by:  Lanier Clam  Subjective/Objective Assessment:   ADMITTED W/VOLVULUS OF SMALL INTESTINE.ZO:XWRUEAV/WUJWJXBJ RESECTION,ILEAL CONDUIT-2/ 2013.     Action/Plan:   FROM HOME ALONE. ACTIVE W/HHRN/PT.   Anticipated DC Date:  12/13/2011   Anticipated DC Plan:  HOME W HOME HEALTH SERVICES         Choice offered to / List presented to:  C-1 Patient        HH arranged  HH-1 RN  HH-2 PT      [facility] agency  [facility]   Status of service:  In process, will continue to follow Medicare Important Message given?  YES (If response is "NO", the following Medicare IM given date fields will be blank) Date Medicare IM given:  12/09/2011 Date Additional Medicare IM given:    Discharge Disposition:    Per UR Regulation:  Reviewed for med. necessity/level of care/duration of stay  If discussed at Long Length of Stay Meetings, dates discussed:    Comments:  12/13/11 Marshawn Normoyle RN,BSN NCM [phone] PATIENT HAS DECLINED [facility] ANY LONGER-I HAVE CONTACTED MARY(LIASON) OF PATIENT'S REQUEST.[facility] CHOSEN FOR HHRN/PT, SUSAN DALE(LIASON) INFORMED.?D/C IN AM.  12/14/11 Elisabeth Strom RN,BSN NCM [phone] PT-HH.[facility] FOLLOWING FOR RESUMPTION OF HHRN/PT.?D/C IN AM IF MED STABLE.  12/10/11 Korbin Notaro RN,BSN NCM [phone] CONTACTED [facility]  MARY(LIASON)ABOUT RESUMPTION HHRN/PT,?D/C IN AM.

## 2011-12-13 NOTE — Discharge Summary (Signed)
Physician Discharge Summary  Patient ID: BURLE KWAN MRN: [id number] DOB/AGE: 01-27-1951 61 y.o.  Admit date: 12/09/2011 Discharge date: 12/13/2011  Primary Care Physician:  Oneal Grout, MD, MD Urologist : Dr. Natalia Leatherwood  Discharge Diagnoses:   1. partial SBO/volvulus resolved 2. bladder carcinoma status post cysto-prostatectomy with ileal conduit for urinary diversion in Feb 2013 3. acute renal failure on chronic kidney disease stage III improved 4. HYPERLIPIDEMIA 5. HYPERTENSION 6.  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 history of Dysrrhythmia, RBBB 8. GERD   Medication List  As of 12/13/2011 11:17 AM   TAKE these medications         amLODipine 10 MG tablet   Commonly known as: NORVASC   Take 10 mg by mouth every morning.      metoprolol tartrate 25 MG tablet   Commonly known as: LOPRESSOR   Take 1 tablet (25 mg total) by mouth 2 (two) times daily.      omeprazole 20 MG capsule   Commonly known as: PRILOSEC   Take 2 capsules (40 mg total) by mouth daily.      oxyCODONE-acetaminophen 5-325 MG per tablet   Commonly known as: PERCOCET   Take 1 tablet by mouth every 6 (six) hours as needed for pain.      pravastatin 40 MG tablet   Commonly known as: PRAVACHOL   Take 40 mg by mouth at bedtime.             Disposition and Follow-up:  1. PCP Dr. Glade Lloyd in one week 2. Dr. Margarita Grizzle in July  Consults:   1. Dr. Natalia Leatherwood urology 2. Dr. Cyndia Bent,  [facility]  Significant Diagnostic Studies: 1. CT Abd/pelvis: 1. Apparent partial small bowel volvulus at the right mid abdomen, with a whirl sign in the mesentery and intermittent decompression of small bowel loops in the periphery of the volvulus. Distension of small bowel loops to 3.4 cm in maximal diameter. This raises concern for partial obstruction due to the volvulus. No high-grade small bowel obstruction seen. 2. Slightly more free fluid  now noted at the right lower quadrant, though the degree of soft tissue inflammation at the right lower quadrant appears relatively stable. This is noted adjacent to the appendix, though it does not appear centered at the appendix. 3. No evidence of hydronephrosis; mild left-sided renal scarring and 5 mm left renal stone again noted. 4. Scattered renal and hepatic cysts seen. 5. Anastomosis at the right lower quadrant is grossly unremarkable in appearance. 6. Ileal conduit not well assessed   2. Dg Abd 2 Views 12/12/2011   IMPRESSION: Improvement in  small bowel obstruction pattern.  Removal of NG tube  Original Report Authenticated By: Genevive Bi, M.D.    Brief H and P: 61 year old male with a history of bladder cancer who underwent bladder and prostate resection with creation of an ileal conduit for urinary diversion in February of 2013. He presented to the ED on the night of 4/8 after he started experiencing sharp abdominal pains on the afternoon of April 7. He says he may be passing ocasional gas.  CT scan while in the ER shows a small bowel volvulus. There is some dilation of the proximal small bowel, but there is air and contrast beyond this area  Hospital Course:  1. SBO/Volvulus of small intestine - treated conservatively with IV fluids, NG tube for decompression, bowel rest. He was also followed by a [facility]  through his hospital course. He had serial x-rays which showed improvement in bowel gas pattern and resolution of the obstruction. Subsequently his NG tube was clamped and then removed and his diet was slowly advanced. Currently tolerating a bland regular diet.  2. bladder cancer status post cystoprostatectomy and ileal conduit in February 2013, followup with Dr. Margarita Grizzle in July 3. ARI/Dehydration - due to 3rd space losses in the SB in setting of CKD III - improved, peak creatinine was 3.2 improved to 1.5 with volume resuscitation .    Time spent on  Discharge:  Signed: Dejour Vos [facility] Pager: [phone] 12/13/2011, 11:17 AM

## 2011-12-13 NOTE — Progress Notes (Signed)
Agree with note of WJ,PA. His xray is improved as is he.

## 2011-12-13 NOTE — Progress Notes (Signed)
Urology Progress Note  Subjective:     No acute urologic events overnight. No abdominal pain. Positive flatus. Positive BM. Negative nausea.  ROS: Negative: Chest pain  Objective:  Patient Vitals for the past 24 hrs:  BP Temp Temp src Pulse Resp SpO2  12/13/11 0548 158/93 mmHg 98.1 F (36.7 C) Oral 59  20  98 %  12/12/11 2232 147/84 mmHg 98.4 F (36.9 C) Oral 77  16  98 %    Physical Exam: General:  No acute distress, awake Cardiovascular:    [x]   S1/S2 present, RRR  []   Irregularly irregular Chest:  CTA-B Abdomen:  Soft. Non-TTP. No rebound TTP. Positive bowel sounds.             Genitourinary: Urostomy stoma pink with clear urine.    I/O last 3 completed shifts: In: 4094.2 [P.O.:1100; I.V.:2944.2; IV Piggyback:50] Out: 3650 [Urine:3650]  Recent Labs  [facility] 12/12/11 0444 12/11/11 0450   HGB 9.5* 10.8*   WBC 6.5 5.5   PLT 216 247    Recent Labs  Basename 12/13/11 0435 12/12/11 0444   NA 141 144   K 3.3* 3.7   CL 110 112   CO2 23 26   BUN 19 33*   CREATININE 1.59* 1.81*   CALCIUM 8.5 8.1*   GFRNONAA 45* 39*   GFRAA 52* 45*     No results found for this basename: PT:2,INR:2,APTT:2 in the last 72 hours   No components found with this basename: ABG:2   Length of stay: 4 days.  Assessment: Partial small bowel obstruction with possible volvulus  Plan: -Likely start regular diet today. -He can follow up with me as planned in July 2013; my office will contact him with the appointment. -My partner will round on him over the weekend.   Natalia Leatherwood, MD [phone]

## 2011-12-13 NOTE — Progress Notes (Signed)
  Pharmacy Note: Protonix IV--> PO  The patient is receiving Protonix by the intravenous route.  Based on criteria approved by the Pharmacy and Therapeutics Committee and the Medical Executive Committee, the medication is being converted to the equivalent oral dose form.  These criteria include: -No Active GI bleeding -Able to tolerate diet of full liquids (or better) or tube feeding -Able to tolerate other medications by the oral or enteral route  If you have any questions about this conversion, please contact the Pharmacy Department (ext [phone]).  Thank you.  Darrol Angel, PharmD Pager: [phone] 12/13/2011 9:59 AM

## 2011-12-13 NOTE — Progress Notes (Signed)
Pt seen and examined Was doing better, tolerated breakfast well, but nausea with lunch and could only tolerate part of it. Will watch overnight, go back to full liquid and advance slowly  Zannie Cove, MD [phone]

## 2011-12-13 NOTE — Progress Notes (Signed)
  Subjective: Eating an eggs, and Ham.  + Bm,  Feels much better. Objective: Vital signs in last 24 hours: Temp:  [98.1 F (36.7 C)-98.4 F (36.9 C)] 98.1 F (36.7 C) (04/12 0548) Pulse Rate:  [59-77] 59  (04/12 0548) Resp:  [16-20] 20  (04/12 0548) BP: (147-158)/(84-93) 158/93 mmHg (04/12 0548) SpO2:  [98 %] 98 % (04/12 0548) Last BM Date: 2011-12-13 TM99.1, BP up some, creatinine improving,  Intake/Output from previous day: December 13, 2022 0701 - 04/12 0700 In: 3194.2 [P.O.:1100; I.V.:2044.2; IV Piggyback:50] Out: 2650 [Urine:2650] Intake/Output this shift:    General appearance: alert, cooperative and no distress GI: soft, non-tender; bowel sounds normal; no masses,  no organomegaly  Lab Results:   Basename 2011/12/13 0444 12/11/11 0450  WBC 6.5 5.5  HGB 9.5* 10.8*  HCT 29.7* 33.2*  PLT 216 247    BMET  Basename 12/13/11 0435 December 13, 2011 0444  NA 141 144  K 3.3* 3.7  CL 110 112  CO2 23 26  GLUCOSE 90 89  BUN 19 33*  CREATININE 1.59* 1.81*  CALCIUM 8.5 8.1*   PT/INR No results found for this basename: LABPROT:2,INR:2 in the last 72 hours   Lab 12/09/11 0042  AST 14  ALT 14  ALKPHOS 76  BILITOT 0.6  PROT 8.0  ALBUMIN 4.1     Lipase  No results found for this basename: lipase     Studies/Results: Dg Abd 2 Views  Dec 13, 2011  *RADIOLOGY REPORT*  Clinical Data: Evaluate small bowel obstruction  ABDOMEN - 2 VIEW  Comparison: 12/11/2011  Findings: Interval removal of the NG tube.  Gas filled loops of small bowel which are not pathologically dilated and reduced in size from prior.  There is oral contrast within the colon and rectum.  There is gas in the rectum.  IMPRESSION: Improvement in  small bowel obstruction pattern.  Removal of NG tube  Original Report Authenticated By: Genevive Bi, M.D.   Dg Abd 2 Views  12/11/2011  *RADIOLOGY REPORT*  Clinical Data: Small bowel obstruction  ABDOMEN - 2 VIEW  Comparison: 12/10/2011  Findings: Contrast material from recent CT  scan noted throughout the colon.  There is a NG tube with tip coiled within stomach. There is nonspecific nonobstructive bowel gas pattern.  No free air is noted on decubitus view.  IMPRESSION: Nonspecific nonobstructive bowel gas pattern.  Contrast material from recent CT scan noted throughout the colon.  NG tube with tip coiled within stomach.  No free abdominal air.  Original Report Authenticated By: Natasha Mead, M.D.    Medications:    . magnesium sulfate 1 - 4 g bolus IVPB  2 g Intravenous Once  . metoprolol tartrate  25 mg Oral BID  . pantoprazole (PROTONIX) IV  40 mg Intravenous Q24H  . DISCONTD: metoprolol  5 mg Intravenous Q6H    Assessment/Plan Possible SBO, Volvulus of small bowel  hx of bladder cancer s/p ileal conduit procedure in 10/2011  Hypertension  Dysrhythmia  Hx stroke  Renal Insuffiency/ARF improving    SBO resolving, will see again as needed.  LOS: 4 days    Clayson Riling 12/13/2011

## 2011-12-14 NOTE — Progress Notes (Signed)
Patient ID: William Collier, male   DOB: 1950/09/07, 61 y.o.   MRN: [id number]   Subjective: Patient reports that he had a very good night and wants to go home this morning. He tolerated his evening meal well. Positive flatus this morning and is without nausea or abdominal pain.  Objective: Vital signs in last 24 hours: Temp:  [98.1 F (36.7 C)-98.7 F (37.1 C)] 98.5 F (36.9 C) (04/13 0700) Pulse Rate:  [61-74] 66  (04/13 0700) Resp:  [20] 20  (04/13 0700) BP: (145-155)/(92-97) 151/96 mmHg (04/13 0700) SpO2:  [98 %-100 %] 100 % (04/13 0700)  Intake/Output from previous day: 04/12 0701 - 04/13 0700 In: 1920 [P.O.:120; I.V.:1800] Out: 1725 [Urine:1725] Intake/Output this shift:    Physical Exam:  Constitutional: Vital signs reviewed. WD WN in NAD   Pulmonary/Chest: Normal effort Abdominal: Soft Genitourinary: Not examined Extremities: No cyanosis or edema   Lab Results:  [facility] 12/12/11 0444  HGB 9.5*  HCT 29.7*   BMET  Basename 12/13/11 0435 12/12/11 0444  NA 141 144  K 3.3* 3.7  CL 110 112  CO2 23 26  GLUCOSE 90 89  BUN 19 33*  CREATININE 1.59* 1.81*  CALCIUM 8.5 8.1*   No results found for this basename: LABPT:3,INR:3 in the last 72 hours No results found for this basename: LABURIN:1 in the last 72 hours Results for orders placed during the hospital encounter of 11/18/11  URINE CULTURE     Status: Normal   Collection Time   11/19/11  1:04 AM      Component Value Range Status Comment   Specimen Description URINE, SUPRAPUBIC   Final    Special Requests NONE   Final    Culture  Setup Time [id number]   Final    Colony Count >=100,000 COLONIES/ML   Final    Culture ENTEROBACTER AEROGENES   Final    Report Status 11/20/2011 FINAL   Final    Organism ID, Bacteria ENTEROBACTER AEROGENES   Final     Studies/Results: No results found.  Assessment/Plan:   Resolving partial small bowel obstruction. Possible discharge today. Patient will keep followup with  Dr. Margarita Grizzle as planned.   LOS: 5 days   Kedron Uno S 12/14/2011, 8:30 AM

## 2011-12-14 NOTE — Progress Notes (Signed)
Volvulus of small intestine  Subjective: Feels much better today. Denies any abdominal pain or nausea. Has eaten almost all of breakfast (still eating) and feels he can go home today. Bowels working  Objective: Vital signs in last 24 hours: Temp:  [98.1 F (36.7 C)-98.7 F (37.1 C)] 98.5 F (36.9 C) (04/13 0700) Pulse Rate:  [61-74] 66  (04/13 0700) Resp:  [20] 20  (04/13 0700) BP: (145-155)/(92-97) 151/96 mmHg (04/13 0700) SpO2:  [98 %-100 %] 100 % (04/13 0700) Last BM Date: 12/12/11  Intake/Output from previous day: 04/12 0701 - 04/13 0700 In: 1920 [P.O.:120; I.V.:1800] Out: 1725 [Urine:1725] Intake/Output this shift:    General appearance: alert, cooperative and no distress GI: soft, non-tender; bowel sounds normal; no masses,  no organomegaly  Lab Results:  No results found for this or any previous visit (from the past 24 hour(s)).   Studies/Results Radiology     MEDS, Scheduled    . metoprolol tartrate  25 mg Oral BID  . pantoprazole  40 mg Oral Q1200  . DISCONTD: pantoprazole (PROTONIX) IV  40 mg Intravenous Q24H     Assessment: Volvulus of small intestine Apparently resolved SBO,  Plan: We will see again PRN   LOS: 5 days    Currie Paris, MD, [facility], [place] [phone]   12/14/2011 8:39 AM

## 2012-01-28 ENCOUNTER — Emergency Department (HOSPITAL_COMMUNITY)
Admission: EM | Admit: 2012-01-28 | Discharge: 2012-01-28 | Disposition: A | Discharge status: Home / Self Care | Payer: Medicare Other | Attending: Emergency Medicine | Admitting: Emergency Medicine

## 2012-01-28 ENCOUNTER — Encounter (HOSPITAL_COMMUNITY): Payer: Self-pay | Admitting: *Deleted

## 2012-01-28 DIAGNOSIS — Z8551 Personal history of malignant neoplasm of bladder: Secondary | ICD-10-CM | POA: Insufficient documentation

## 2012-01-28 DIAGNOSIS — Y849 Medical procedure, unspecified as the cause of abnormal reaction of the patient, or of later complication, without mention of misadventure at the time of the procedure: Secondary | ICD-10-CM | POA: Insufficient documentation

## 2012-01-28 DIAGNOSIS — Z87891 Personal history of nicotine dependence: Secondary | ICD-10-CM | POA: Insufficient documentation

## 2012-01-28 DIAGNOSIS — K219 Gastro-esophageal reflux disease without esophagitis: Secondary | ICD-10-CM | POA: Insufficient documentation

## 2012-01-28 DIAGNOSIS — N9989 Other postprocedural complications and disorders of genitourinary system: Secondary | ICD-10-CM | POA: Insufficient documentation

## 2012-01-28 DIAGNOSIS — N183 Chronic kidney disease, stage 3 unspecified: Secondary | ICD-10-CM | POA: Insufficient documentation

## 2012-01-28 DIAGNOSIS — Z8673 Personal history of transient ischemic attack (TIA), and cerebral infarction without residual deficits: Secondary | ICD-10-CM | POA: Insufficient documentation

## 2012-01-28 DIAGNOSIS — N99528 Other complication of other external stoma of urinary tract: Secondary | ICD-10-CM

## 2012-01-28 DIAGNOSIS — IMO0002 Reserved for concepts with insufficient information to code with codable children: Secondary | ICD-10-CM | POA: Insufficient documentation

## 2012-01-28 DIAGNOSIS — I129 Hypertensive chronic kidney disease with stage 1 through stage 4 chronic kidney disease, or unspecified chronic kidney disease: Secondary | ICD-10-CM | POA: Insufficient documentation

## 2012-01-28 NOTE — ED Notes (Signed)
Urostomy care done. Bag in place. Paper scrubs given to pt for d/c home.

## 2012-01-28 NOTE — Discharge Instructions (Signed)
Call your urologist for repeat evaluation as needed. Please be careful to not allow your new urostomy bag to come off. Return to the hospital for severe or worsening symptoms including abdominal pain, fever, vomiting

## 2012-01-28 NOTE — ED Provider Notes (Signed)
History     CSN: [id number]  Arrival date & time 01/28/12  0618   First MD Initiated Contact with Patient 01/28/12 [phone]      Chief Complaint  Patient presents with  . needs urostomy bag replacement     (Consider location/radiation/quality/duration/timing/severity/associated sxs/prior treatment) HPI Comments: Patient has a history of bladder cancer, he has had a diverting urostomy of which she states the bag came off accidentally last night while he was sleeping.  He denies any other complaints and has no fevers chills nausea vomiting or abdominal pain.  States that this is never happened to him before, he has no pain at the bag site, there is no bleeding, no rashes, no difficulty eating.  The history is provided by the patient and medical records.    Past Medical History  Diagnosis Date  . Hypertension 08-20-11  . Dysrhythmia 08-20-11    skip beat, hx. RBBB  . GERD (gastroesophageal reflux disease) 08-20-11    controls with diet, hx. colon polyps(benign)  . Cancer 08-20-11    bladder cancer  . Arthritis   . Stroke 08-20-11    '08-Lt. sided weakness with residual > lt. arm.  . Chronic kidney disease     CKD stage III    Past Surgical History  Procedure Date  . Bladder surgery 08-20-11    07-01-11 bladder surgery, past hx. lithotripsy  . Shoulder arthrotomy 08-20-11    left shoulder with retained pins  . Knee arthroscopy 08-20-11    bil. knee scopes-torn ligament  . Cystoscopy w/ ureteral stent placement 08/21/2011    Procedure: CYSTOSCOPY WITH RETROGRADE PYELOGRAM/URETERAL STENT PLACEMENT;  Surgeon: Milford Cage, MD;  Location: [facility];  Service: Urology;  Laterality: Left;  . Cystoscopy with biopsy 08/21/2011    Procedure: CYSTOSCOPY WITH BIOPSY;  Surgeon: Milford Cage, MD;  Location: [facility];  Service: Urology;  Laterality: N/A;  . Transurethral resection of bladder tumor 08/21/2011    Procedure: TRANSURETHRAL RESECTION OF BLADDER TUMOR (TURBT);   Surgeon: Milford Cage, MD;  Location: [facility];  Service: Urology;  Laterality: N/A;  need PK gyrus  . Cystectomy 10/07/2011    Procedure: CYSTECTOMY COMPLETE;  Surgeon: Milford Cage, MD;  Location: [facility];  Service: Urology;  Laterality: Bilateral;  Radical Cystoprostatectomy Bilateral Pelvic Lymph Node Dissection Formation Of Ileal Conduit   . Prostatectomy 10/07/2011    Procedure: PROSTATECTOMY;  Surgeon: Milford Cage, MD;  Location: [facility];  Service: Urology;  Laterality: N/A;  . Lymph node dissection 10/07/2011    Procedure: LYMPH NODE DISSECTION;  Surgeon: Milford Cage, MD;  Location: [facility];  Service: Urology;  Laterality: Bilateral;    No family history on file.  History  Substance Use Topics  . Smoking status: Former Smoker -- 1.0 packs/day for 40 years    Types: Cigarettes    Quit date: 10/28/2011  . Smokeless tobacco: Not on file  . Alcohol Use: No      Review of Systems  All other systems reviewed and are negative.    Allergies  Penicillins and Pregabalin  Home Medications   Current Outpatient Rx  Name Route Sig Dispense Refill  . AMLODIPINE BESYLATE 10 MG PO TABS Oral Take 10 mg by mouth every morning.     Marland Kitchen METOPROLOL TARTRATE 25 MG PO TABS Oral Take 1 tablet (25 mg total) by mouth 2 (two) times daily. 30 tablet 0  . OMEPRAZOLE 20 MG PO CPDR Oral Take 2 capsules (40 mg  total) by mouth daily.    Marland Kitchen PRAVASTATIN SODIUM 40 MG PO TABS Oral Take 40 mg by mouth at bedtime.       BP 146/112  Pulse 109  Temp(Src) 98.4 F (36.9 C) (Oral)  Resp 19  SpO2 99%  Physical Exam  Nursing note and vitals reviewed. Constitutional: He appears well-developed and well-nourished. No distress.  HENT:  Head: Normocephalic and atraumatic.  Mouth/Throat: Oropharynx is clear and moist. No oropharyngeal exudate.  Eyes: Conjunctivae and EOM are normal. Pupils are equal, round, and reactive to light. Right eye exhibits no discharge. Left eye exhibits no  discharge. No scleral icterus.  Neck: Normal range of motion. Neck supple. No JVD present. No thyromegaly present.  Cardiovascular: Normal rate, regular rhythm, normal heart sounds and intact distal pulses.  Exam reveals no gallop and no friction rub.   No murmur heard. Pulmonary/Chest: Effort normal and breath sounds normal. No respiratory distress. He has no wheezes. He has no rales.  Abdominal: Soft. Bowel sounds are normal. He exhibits no distension and no mass. There is no tenderness.       Urostomy site appears clean without any signs of skin breakdown  Lymphadenopathy:    He has no cervical adenopathy.  Psychiatric: He has a normal mood and affect. His behavior is normal.    ED Course  Procedures (including critical care time)  Labs Reviewed - No data to display No results found.   1. Complication of urostomy       MDM  Urostomy bag ordered, will replace prior to discharge from the emergency department, otherwise the exam is benign and reassuring that this is not related to urinary infection or other disease process. He states that it was not adhering well to the abdominal wall and thus during sleep last night and accidentally was removed.        Vida Roller, MD 01/28/12 [phone]

## 2012-01-28 NOTE — ED Notes (Signed)
Pt just needs a urostomy bag and has no other complaints

## 2012-01-28 NOTE — ED Notes (Signed)
[employer] called for urostomy supplies.

## 2012-01-28 NOTE — ED Notes (Signed)
NAD noted at time of d/c home 

## 2012-09-07 ENCOUNTER — Ambulatory Visit: Payer: Medicare Other | Attending: Internal Medicine

## 2012-09-15 ENCOUNTER — Other Ambulatory Visit (HOSPITAL_COMMUNITY): Payer: Self-pay | Admitting: Urology

## 2012-09-15 DIAGNOSIS — C679 Malignant neoplasm of bladder, unspecified: Secondary | ICD-10-CM

## 2012-09-24 ENCOUNTER — Ambulatory Visit (HOSPITAL_COMMUNITY)
Admission: RE | Admit: 2012-09-24 | Discharge: 2012-09-24 | Disposition: A | Discharge status: Home / Self Care | Payer: Medicare Other | Source: Ambulatory Visit | Attending: Urology | Admitting: Urology

## 2012-09-24 DIAGNOSIS — R109 Unspecified abdominal pain: Secondary | ICD-10-CM | POA: Insufficient documentation

## 2012-09-24 DIAGNOSIS — C679 Malignant neoplasm of bladder, unspecified: Secondary | ICD-10-CM | POA: Insufficient documentation

## 2012-09-24 DIAGNOSIS — Z432 Encounter for attention to ileostomy: Secondary | ICD-10-CM | POA: Insufficient documentation

## 2012-09-24 MED ORDER — DIATRIZOATE MEGLUMINE 30 % UR SOLN
Freq: Once | URETHRAL | Status: AC | PRN
  Administered 2012-09-24: 50 mL

## 2012-10-25 IMAGING — CT CT ABD-PELV W/O CM
2 of 4 series · 16 of 46 positions shown, 18 images · non-contrast
Comparison: None.

CLINICAL DATA: Hematuria, flank pain.

CT ABDOMEN AND PELVIS WITHOUT CONTRAST
TECHNIQUE: Multidetector CT imaging of the abdomen and pelvis was
performed following the standard protocol without intravenous
contrast.

[Series 2: under 200# stone no prev · axial · 0.69mm/px · z∈[-392,-6]mm · 13 of 85 slices shown, 15 images]
[im 4/85  soft-tissue]
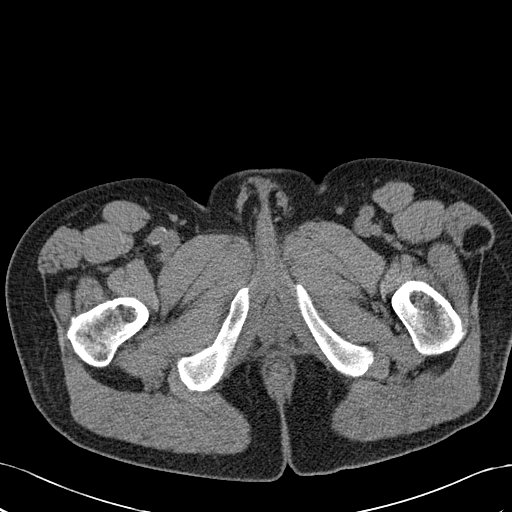
[im 4/85  bone]
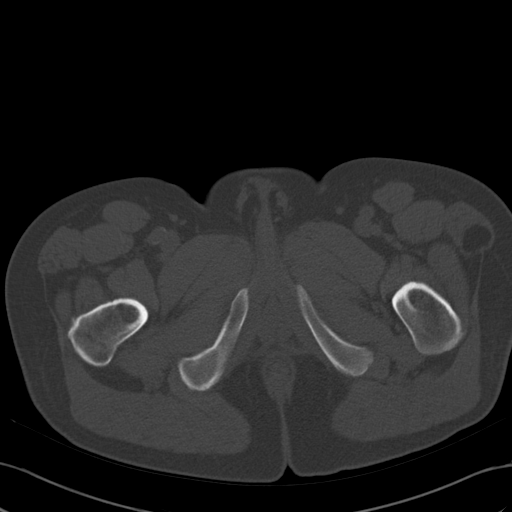
[im 11/85  soft-tissue]
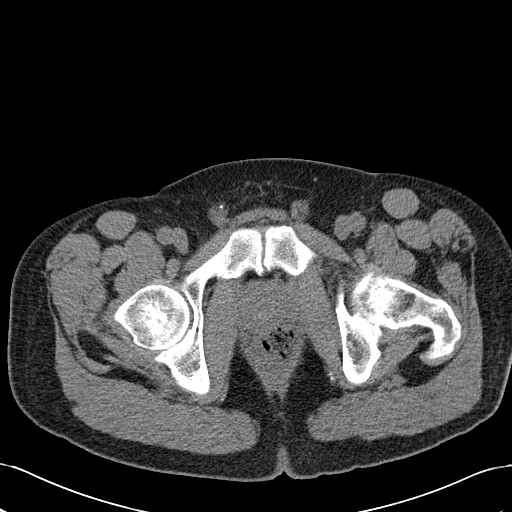
[im 18/85  soft-tissue]
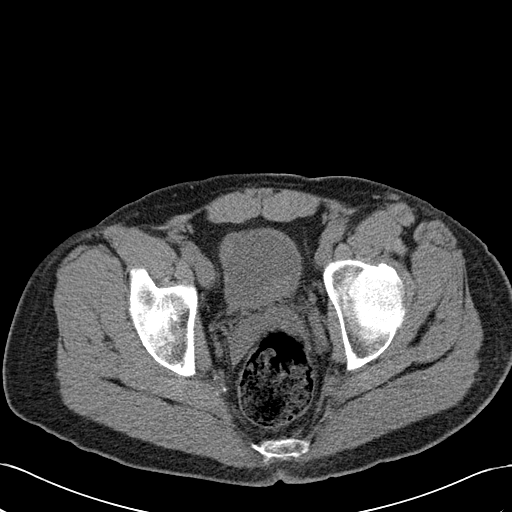
[im 25/85  soft-tissue]
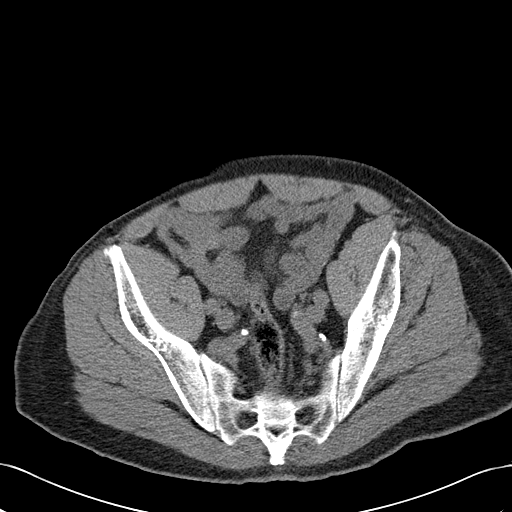
[im 29/85  soft-tissue]
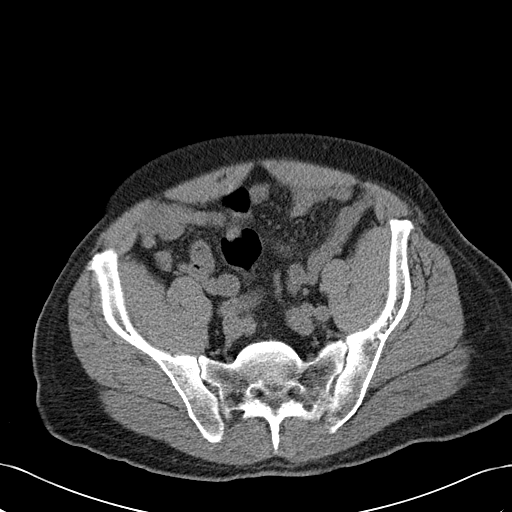
[im 36/85  soft-tissue]
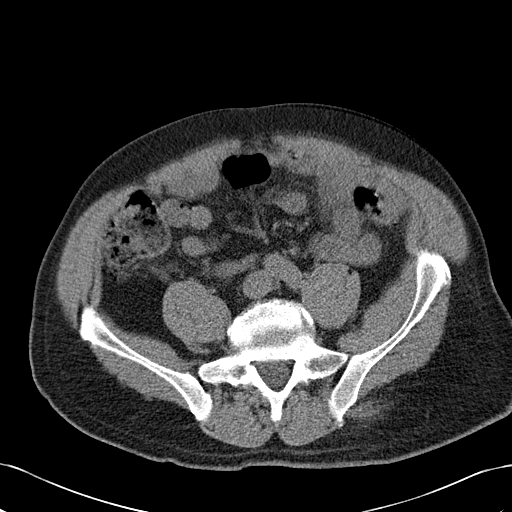
[im 43/85  soft-tissue]
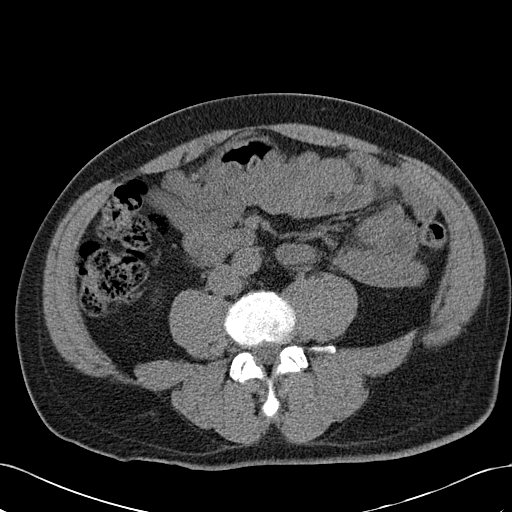
[im 50/85  soft-tissue]
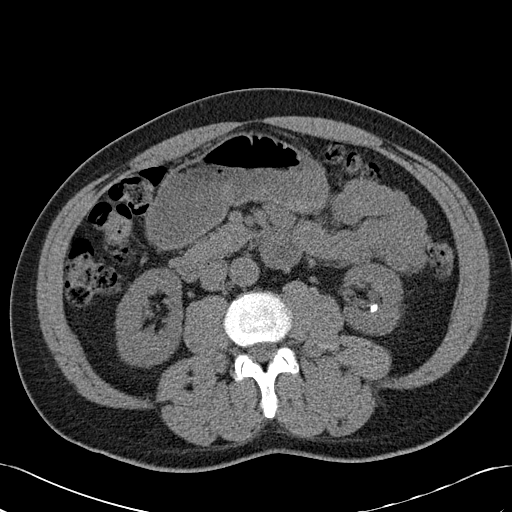
[im 57/85  soft-tissue]
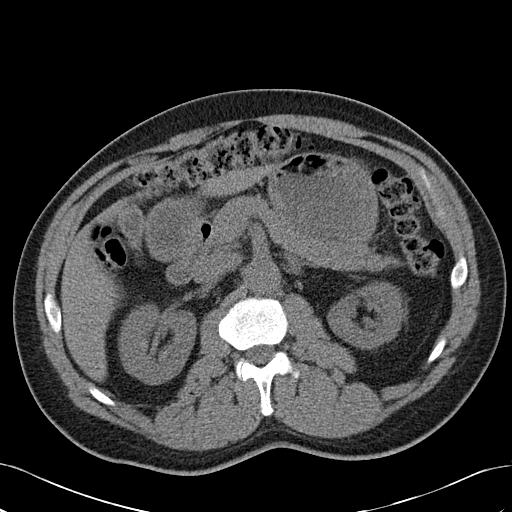
[im 57/85  bone]
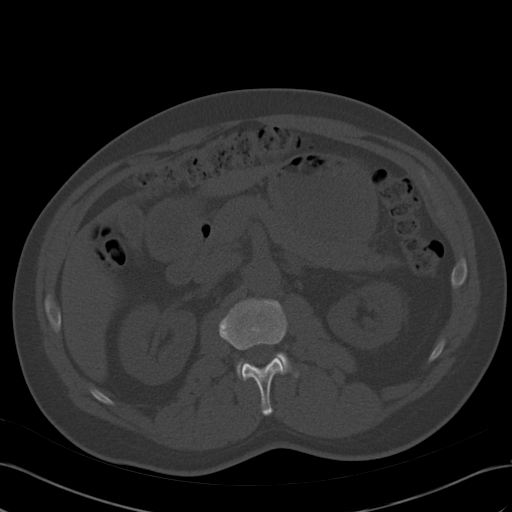
[im 60/85  soft-tissue]
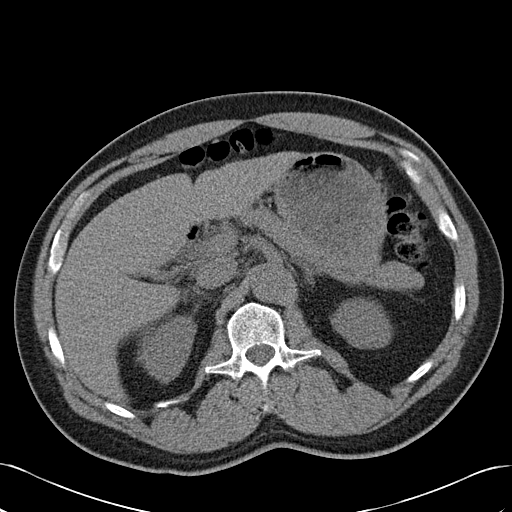
[im 67/85  soft-tissue]
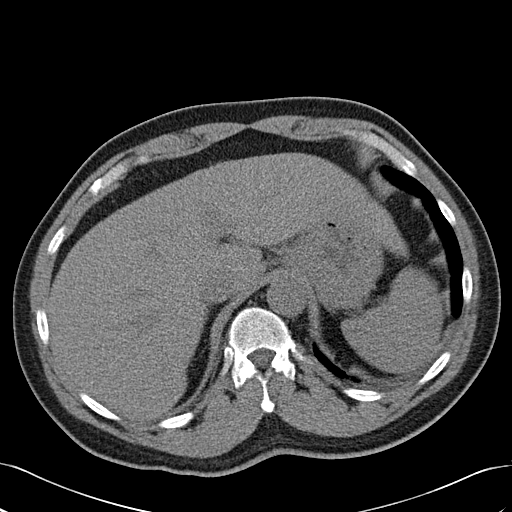
[im 74/85  soft-tissue]
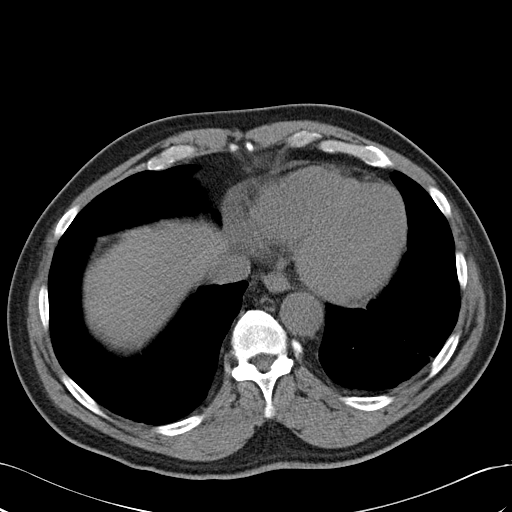
[im 81/85  soft-tissue]
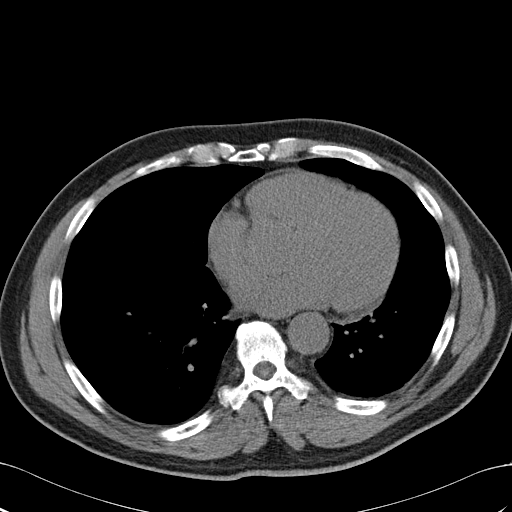

[Series 602: <mpr thick range> · coronal · 0.83mm/px · 3 of 86 slices shown]
[im 29/86  soft-tissue]
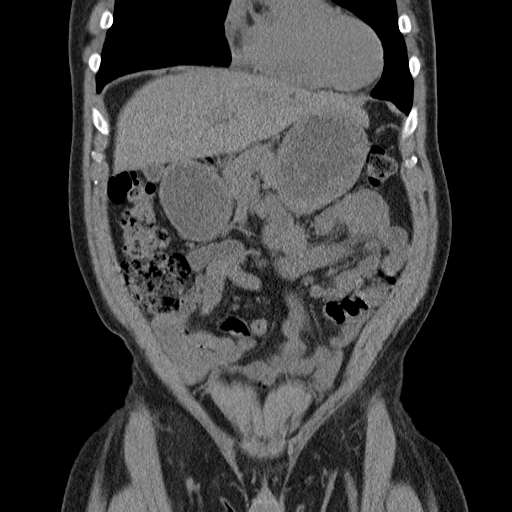
[im 38/86  soft-tissue]
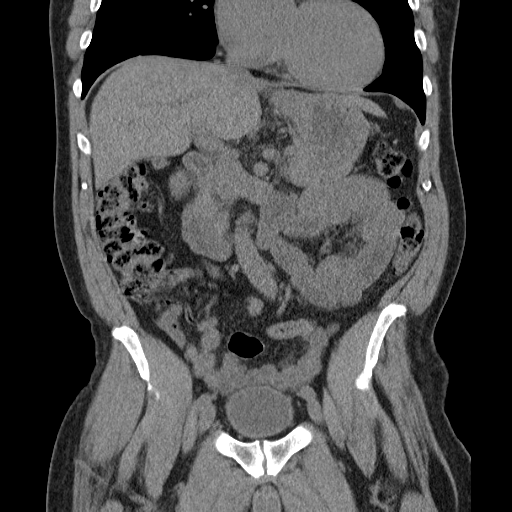
[im 48/86  soft-tissue]
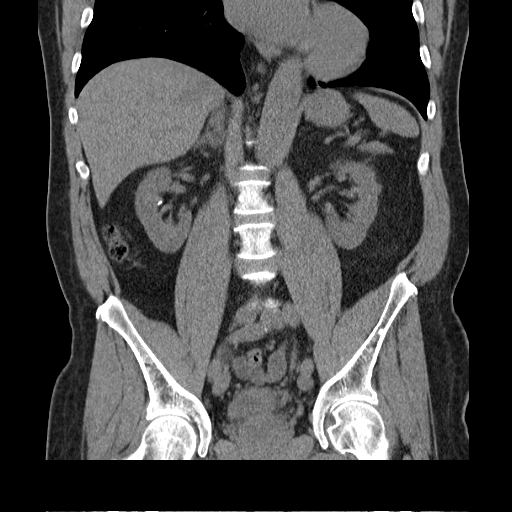

[16 of 46 positions shown; findings below may reference images not displayed]

FINDINGS: Mild bibasilar scarring and/or atelectasis.  The heart
size is upper normal limits.  Trace pericardial fluid and/or
thickening.  No pleural effusion.

There are several hypodensities scattered throughout the liver,
which are nonspecific.  The larger of which measures 8 mm and water
attenuation, most in keeping with a biliary cyst or hamartoma.

Unremarkable spleen, pancreas, and adrenal glands.

There are numerous bilateral renal calculi, measuring up to 1.2 cm
within the right upper pole.  No perinephric fat stranding or
hydronephrosis.  There is an indeterminate hypodense lesion within
the anterior aspect of the right lower pole.  While favored to
represent a cyst, indeterminate on this examination. The left
kidney is asymmetrically smaller than the right, with areas of
scarring, particularly involving the left lower pole.

No hydroureter or ureteral calculi.  The bladder is nearly
decompressed, limiting evaluation.  There are areas of wall
thickening, for example along the anterior right wall on image 68).
No perivesicular fat stranding.

Mild large bowel diverticulosis without CT evidence for
diverticulitis.  Normal appendix.  Small bowel loops are normal in
course and caliber.

No mesenteric or retroperitoneal lymphadenopathy.  No free
intraperitoneal air or fluid.

Mild scattered atherosclerosis of the aorta and its branches.
Otherwise, limited evaluation of the vasculature without
intravenous contrast.

Multilevel degenerative changes of the thoracolumbar spine are
mild.  There is a peripherally well-corticated lesion within the
left iliac bone, adjacent to the SI joint, nonspecific however
without overly aggressive imaging features.
IMPRESSION: Nephrolithiasis.  No hydronephrosis or ureteral calculi identified.

Multiple hepatic hypodensities are nonspecific,  the larger of
which are favored to be biliary cyst or hamartomas.  Most of which
are too small to further characterize.

Limited evaluation of the bladder given incomplete distension.
There is mild eccentric thickening along the anterior right bladder
wall.  Given the history of hematuria, consider a follow-up
ultrasound or hematuria protocol CTU (which includes
delayed/excretory phase images) to exclude an underlying lesion.

Nonspecific hypodensity within the anterior aspect of the right
lower pole. Favored to represent a cyst.  Can be further
characterized with the above recommended CTU or a non emergent
renal ultrasound follow-up.

## 2012-11-24 ENCOUNTER — Other Ambulatory Visit: Payer: Self-pay | Admitting: Geriatric Medicine

## 2012-11-24 MED ORDER — OXYCODONE HCL 5 MG PO TABA: 5.0000 mg | ORAL_TABLET | Freq: Two times a day (BID) | ORAL | Status: DC

## 2012-12-21 ENCOUNTER — Encounter: Payer: Self-pay | Admitting: *Deleted

## 2012-12-22 ENCOUNTER — Encounter: Payer: Self-pay | Admitting: Internal Medicine

## 2012-12-22 ENCOUNTER — Ambulatory Visit (INDEPENDENT_AMBULATORY_CARE_PROVIDER_SITE_OTHER): Payer: Medicare Other | Admitting: Internal Medicine

## 2012-12-22 VITALS — BP 148/92 | HR 54 | Temp 98.1°F | Resp 20 | Ht 68.0 in | Wt 175.0 lb

## 2012-12-22 DIAGNOSIS — I10 Essential (primary) hypertension: Secondary | ICD-10-CM

## 2012-12-22 DIAGNOSIS — I6930 Unspecified sequelae of cerebral infarction: Secondary | ICD-10-CM | POA: Insufficient documentation

## 2012-12-22 DIAGNOSIS — G894 Chronic pain syndrome: Secondary | ICD-10-CM | POA: Insufficient documentation

## 2012-12-22 DIAGNOSIS — I6990 Unspecified sequelae of unspecified cerebrovascular disease: Secondary | ICD-10-CM

## 2012-12-22 MED ORDER — GABAPENTIN 100 MG PO CAPS: 100.0000 mg | ORAL_CAPSULE | Freq: Three times a day (TID) | ORAL | Status: DC

## 2012-12-22 MED ORDER — ASPIRIN 325 MG PO TABS: 325.0000 mg | ORAL_TABLET | Freq: Every day | ORAL | Status: DC

## 2012-12-22 MED ORDER — LISINOPRIL 10 MG PO TABS: 10.0000 mg | ORAL_TABLET | Freq: Every day | ORAL | Status: DC

## 2012-12-22 MED ORDER — OXYCODONE HCL 5 MG PO TABA: 5.0000 mg | ORAL_TABLET | Freq: Two times a day (BID) | ORAL | Status: DC

## 2012-12-22 NOTE — Patient Instructions (Signed)
Take medications as prescribed. Stop taking lopressor completely because of your slow heart rate. New medications have been sent to your pharmacy.

## 2012-12-22 NOTE — Progress Notes (Signed)
  Subjective:    Patient ID: William Collier, male    DOB: 19-Mar-1951, 62 y.o.   MRN: [id number]  HPI  Patient here for follow up on his chronic pain. His weakness has improved and pain is under better control but has intermittent nerve pain and spasm. Continues to wear the brace. No falls reported Has been taking his lopressor. Has low heart rate today in office. No dizziness or lightheadedness No chest pain  Review of Systems  Constitutional: Negative for fever, chills, activity change and appetite change.  HENT: Negative for congestion.   Respiratory: Negative for shortness of breath.   Cardiovascular: Negative for chest pain, palpitations and leg swelling.  Gastrointestinal: Negative for abdominal pain and abdominal distention.  Musculoskeletal: Positive for gait problem.  Neurological: Negative for dizziness, syncope and headaches.  Hematological: Negative for adenopathy.  Psychiatric/Behavioral: Negative for confusion, sleep disturbance, dysphoric mood and agitation.       Objective:   Physical Exam  Vitals reviewed. Constitutional: He is oriented to person, place, and time. He appears well-developed and well-nourished. No distress.  HENT:  Head: Normocephalic and atraumatic.  Mouth/Throat: Oropharynx is clear and moist.  Eyes: Conjunctivae are normal. Pupils are equal, round, and reactive to light.  Cardiovascular: Normal rate and regular rhythm.   No murmur heard. Pulmonary/Chest: Effort normal and breath sounds normal.  Abdominal: Soft. Bowel sounds are normal. He exhibits no distension. There is no tenderness. There is no guarding.  Urostomy bag in place  Musculoskeletal: Normal range of motion.  Left sided weakness in UE and LE  Neurological: He is alert and oriented to person, place, and time. No cranial nerve deficit.  Skin: Skin is warm and dry. He is not diaphoretic.  Psychiatric: He has a normal mood and affect. His behavior is normal.       Assessment &  Plan:   HTN- elevated blood pressure. With bradycardia, will stop metoprolol and have him on lisinopril 10 mg daily with amlodipine 10 mg daily. Reassess bp in 2 months. 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 Explained importance of keeping bp under control, taking aspirin and statin. Script provided. Has left sided weakness  Chronic pain- persist but better than before. Gabapentin had helped but not taking for past 1 week. Refill provided as this will also help with nerve pain. Refill on oxycocodone provided.

## 2013-01-19 ENCOUNTER — Other Ambulatory Visit: Payer: Self-pay | Admitting: *Deleted

## 2013-01-21 ENCOUNTER — Other Ambulatory Visit: Payer: Self-pay | Admitting: *Deleted

## 2013-01-21 MED ORDER — OXYCODONE HCL 5 MG PO TABA: 5.0000 mg | ORAL_TABLET | Freq: Two times a day (BID) | ORAL | Status: DC

## 2013-02-05 ENCOUNTER — Other Ambulatory Visit (HOSPITAL_COMMUNITY): Payer: Self-pay | Admitting: Urology

## 2013-02-05 DIAGNOSIS — C679 Malignant neoplasm of bladder, unspecified: Secondary | ICD-10-CM

## 2013-02-15 ENCOUNTER — Other Ambulatory Visit (HOSPITAL_COMMUNITY): Payer: Self-pay | Admitting: Urology

## 2013-02-15 ENCOUNTER — Ambulatory Visit (HOSPITAL_COMMUNITY)
Admission: RE | Admit: 2013-02-15 | Discharge: 2013-02-15 | Disposition: A | Discharge status: Home / Self Care | Payer: Medicare Other | Source: Ambulatory Visit | Attending: Urology | Admitting: Urology

## 2013-02-15 DIAGNOSIS — C679 Malignant neoplasm of bladder, unspecified: Secondary | ICD-10-CM | POA: Insufficient documentation

## 2013-02-19 ENCOUNTER — Other Ambulatory Visit: Payer: Self-pay | Admitting: Geriatric Medicine

## 2013-02-19 ENCOUNTER — Other Ambulatory Visit: Payer: Medicare Other

## 2013-02-19 DIAGNOSIS — I6990 Unspecified sequelae of unspecified cerebrovascular disease: Secondary | ICD-10-CM

## 2013-02-19 DIAGNOSIS — I10 Essential (primary) hypertension: Secondary | ICD-10-CM

## 2013-02-19 MED ORDER — OXYCODONE HCL 5 MG PO TABA: 5.0000 mg | ORAL_TABLET | Freq: Two times a day (BID) | ORAL | Status: DC

## 2013-02-20 LAB — CBC WITH DIFFERENTIAL/PLATELET
Basophils Absolute: 0 10*3/uL (ref 0.0–0.2)
Basos: 0 % (ref 0–3)
Eos: 1 % (ref 0–5)
Eosinophils Absolute: 0.1 10*3/uL (ref 0.0–0.4)
HCT: 42.7 % (ref 37.5–51.0)
Immature Grans (Abs): 0 10*3/uL (ref 0.0–0.1)
Immature Granulocytes: 0 % (ref 0–2)
Lymphocytes Absolute: 2 10*3/uL (ref 0.7–3.1)
MCH: 29.5 pg (ref 26.6–33.0)
MCV: 88 fL (ref 79–97)
Monocytes Absolute: 0.4 10*3/uL (ref 0.1–0.9)
Neutrophils Absolute: 3.3 10*3/uL (ref 1.4–7.0)
RBC: 4.84 x10E6/uL (ref 4.14–5.80)
WBC: 5.8 10*3/uL (ref 3.4–10.8)

## 2013-02-20 LAB — BASIC METABOLIC PANEL
BUN: 28 mg/dL — ABNORMAL HIGH (ref 8–27)
CO2: 18 mmol/L (ref 18–29)
Chloride: 109 mmol/L — ABNORMAL HIGH (ref 97–108)
Glucose: 84 mg/dL (ref 65–99)

## 2013-02-23 ENCOUNTER — Ambulatory Visit: Payer: Medicare Other | Admitting: Internal Medicine

## 2013-02-23 DIAGNOSIS — Z0289 Encounter for other administrative examinations: Secondary | ICD-10-CM

## 2013-03-01 ENCOUNTER — Encounter: Payer: Self-pay | Admitting: *Deleted

## 2013-03-02 ENCOUNTER — Ambulatory Visit: Payer: Medicare Other | Admitting: Internal Medicine

## 2013-03-03 ENCOUNTER — Ambulatory Visit (INDEPENDENT_AMBULATORY_CARE_PROVIDER_SITE_OTHER): Payer: Medicare Other | Admitting: Internal Medicine

## 2013-03-03 VITALS — BP 114/80 | HR 60 | Temp 99.0°F | Resp 14 | Ht 68.0 in | Wt 173.4 lb

## 2013-03-03 DIAGNOSIS — I10 Essential (primary) hypertension: Secondary | ICD-10-CM

## 2013-03-03 DIAGNOSIS — N289 Disorder of kidney and ureter, unspecified: Secondary | ICD-10-CM

## 2013-03-03 DIAGNOSIS — I6990 Unspecified sequelae of unspecified cerebrovascular disease: Secondary | ICD-10-CM

## 2013-03-03 NOTE — Progress Notes (Signed)
Patient ID: William Collier, male   DOB: Jun 20, 1951, 62 y.o.   MRN: [id number]  Chief Complaint  Patient presents with  . Follow-up    BP  check   Allergies  Allergen Reactions  . Penicillins Other (See Comments)    HAD WHEN WAS A CHILD   . Pregabalin Swelling   HPI- Here for follow up on bp and heart rate after medication changes made 2 months back. His heart rate has imporved and bp is well controlled. No headache or blurry vision Pain under control with current regimen Has been having some itching around urostomy site.  Review of Systems  Constitutional: Negative for fever, chills, activity change and appetite change.  HENT: Negative for congestion.   Respiratory: Negative for shortness of breath.   Cardiovascular: Negative for chest pain, palpitations and leg swelling.  Gastrointestinal: Negative for abdominal pain and abdominal distention.  Musculoskeletal: Positive for gait problem. Using a cane Neurological: Negative for dizziness, syncope and headaches.  Hematological: Negative for adenopathy.  Psychiatric/Behavioral: Negative for confusion, sleep disturbance, dysphoric mood and agitation.   Medications reviewed  BP 114/80  Pulse 60  Temp(Src) 99 F (37.2 C) (Oral)  Resp 14  Ht 5\' 8"  (1.727 m)  Wt 173 lb 6.4 oz (78.654 kg)  BMI 26.37 kg/m2  SpO2 99%  Physical exam- Constitutional: He is oriented to person, place, and time. He appears well-developed and well-nourished. No distress.  HENT:   Head: Normocephalic and atraumatic.   Mouth/Throat: Oropharynx is clear and moist.  Eyes: Conjunctivae are normal. Pupils are equal, round, and reactive to light.  Cardiovascular: Normal rate and regular rhythm.    No murmur heard. Pulmonary/Chest: Effort normal and breath sounds normal.  Abdominal: Soft. Bowel sounds are normal. He exhibits no distension. There is no tenderness. There is no guarding.  Urostomy bag in place  Musculoskeletal: Normal range of motion.  Left  sided weakness in UE and LE  Neurological: He is alert and oriented to person, place, and time. No cranial nerve deficit.  Skin: Skin is warm and dry. He is not diaphoretic.  Psychiatric: He has a normal mood and affect. His behavior is normal.   Labs- CBC    Component Value Date/Time   WBC 5.8 02/19/2013 0847   WBC 6.5 12/12/2011 0444   RBC 4.84 02/19/2013 0847   RBC 3.31* 12/12/2011 0444   HGB 14.3 02/19/2013 0847   HCT 42.7 02/19/2013 0847   PLT 216 12/12/2011 0444   MCV 88 02/19/2013 0847   MCH 29.5 02/19/2013 0847   MCH 28.7 12/12/2011 0444   MCHC 33.5 02/19/2013 0847   MCHC 32.0 12/12/2011 0444   RDW 15.0 02/19/2013 0847   RDW 15.3 12/12/2011 0444   LYMPHSABS 2.0 02/19/2013 0847   LYMPHSABS 0.9 12/09/2011 0042   MONOABS 0.3 12/09/2011 0042   EOSABS 0.1 02/19/2013 0847   EOSABS 0.0 12/09/2011 0042   BASOSABS 0.0 02/19/2013 0847   BASOSABS 0.0 12/09/2011 0042    CMP     Component Value Date/Time   NA 144 02/19/2013 0847   NA 141 12/13/2011 0435   K 4.3 02/19/2013 0847   CL 109* 02/19/2013 0847   CO2 18 02/19/2013 0847   GLUCOSE 84 02/19/2013 0847   GLUCOSE 90 12/13/2011 0435   BUN 28* 02/19/2013 0847   BUN 19 12/13/2011 0435   CREATININE 1.94* 02/19/2013 0847   CALCIUM 9.7 02/19/2013 0847   PROT 8.0 12/09/2011 0042   ALBUMIN 4.1 12/09/2011 0042  AST 14 12/09/2011 0042   ALT 14 12/09/2011 0042   ALKPHOS 76 12/09/2011 0042   BILITOT 0.6 12/09/2011 0042   GFRNONAA 36* 02/19/2013 0847   GFRAA 42* 02/19/2013 0847    ASSESSMENT/PLAN-  HTN- blood pressure well controlled. Continue lisinopril and amlodipine. Slightly impaired renal function could be from long standing chronic HTN. Check bmp prior to next visit. Warning signs with elevated bp explained to the patient  cva late effect- continue asa and bp medications with statin.   Chronic pain- persist but better than before. Gabapentin had helped with prn oxycocodone

## 2013-03-04 ENCOUNTER — Other Ambulatory Visit: Payer: Self-pay | Admitting: Internal Medicine

## 2013-03-04 ENCOUNTER — Other Ambulatory Visit: Payer: Self-pay | Admitting: Nurse Practitioner

## 2013-03-17 ENCOUNTER — Ambulatory Visit (HOSPITAL_COMMUNITY)
Admission: RE | Admit: 2013-03-17 | Discharge: 2013-03-17 | Disposition: A | Discharge status: Home / Self Care | Payer: Medicare Other | Source: Ambulatory Visit | Attending: Urology | Admitting: Urology

## 2013-03-17 DIAGNOSIS — C679 Malignant neoplasm of bladder, unspecified: Secondary | ICD-10-CM | POA: Insufficient documentation

## 2013-03-17 DIAGNOSIS — K7689 Other specified diseases of liver: Secondary | ICD-10-CM | POA: Insufficient documentation

## 2013-03-17 DIAGNOSIS — N281 Cyst of kidney, acquired: Secondary | ICD-10-CM | POA: Insufficient documentation

## 2013-03-24 ENCOUNTER — Other Ambulatory Visit: Payer: Self-pay | Admitting: Geriatric Medicine

## 2013-03-24 MED ORDER — OXYCODONE HCL 5 MG PO TABA: 5.0000 mg | ORAL_TABLET | Freq: Two times a day (BID) | ORAL | Status: DC

## 2013-04-23 ENCOUNTER — Other Ambulatory Visit: Payer: Self-pay | Admitting: Geriatric Medicine

## 2013-04-23 MED ORDER — OXYCODONE HCL 5 MG PO TABA: 5.0000 mg | ORAL_TABLET | Freq: Two times a day (BID) | ORAL | Status: DC

## 2013-05-24 ENCOUNTER — Other Ambulatory Visit: Payer: Self-pay | Admitting: *Deleted

## 2013-05-24 MED ORDER — OXYCODONE HCL 5 MG PO TABA: 5.0000 mg | ORAL_TABLET | Freq: Two times a day (BID) | ORAL | Status: DC

## 2013-06-10 ENCOUNTER — Other Ambulatory Visit: Payer: Self-pay | Admitting: Internal Medicine

## 2013-06-16 ENCOUNTER — Encounter: Payer: Self-pay | Admitting: Internal Medicine

## 2013-06-16 ENCOUNTER — Ambulatory Visit (INDEPENDENT_AMBULATORY_CARE_PROVIDER_SITE_OTHER): Payer: Medicare Other | Admitting: Internal Medicine

## 2013-06-16 VITALS — BP 122/84 | HR 71 | Temp 98.2°F | Wt 167.6 lb

## 2013-06-16 DIAGNOSIS — R29898 Other symptoms and signs involving the musculoskeletal system: Secondary | ICD-10-CM

## 2013-06-16 DIAGNOSIS — I6990 Unspecified sequelae of unspecified cerebrovascular disease: Secondary | ICD-10-CM

## 2013-06-16 DIAGNOSIS — I6930 Unspecified sequelae of cerebral infarction: Secondary | ICD-10-CM

## 2013-06-16 DIAGNOSIS — E785 Hyperlipidemia, unspecified: Secondary | ICD-10-CM

## 2013-06-16 DIAGNOSIS — Z23 Encounter for immunization: Secondary | ICD-10-CM

## 2013-06-16 DIAGNOSIS — I10 Essential (primary) hypertension: Secondary | ICD-10-CM

## 2013-06-16 MED ORDER — TETANUS-DIPHTHERIA TOXOIDS TD 2-2 LF/0.5ML IM SUSP: 0.5000 mL | Freq: Once | INTRAMUSCULAR | Status: DC

## 2013-06-16 NOTE — Progress Notes (Signed)
Patient ID: William Collier, male   DOB: 23-Jan-1951, 62 y.o.   MRN: [id number]  Chief Complaint  Patient presents with  . Medical Managment of Chronic Issues    3 month f/u  . Immunizations    Tdap, influenza   Allergies  Allergen Reactions  . Penicillins Other (See Comments)    HAD WHEN WAS A CHILD   . Pregabalin Swelling    HPI 62 y/o male patient is here for routine follow up. His left knee has been buckling up for about a month or two as per the patient, especially when he is walking. 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 He uses a splint for his thigh and ankle. No recent falls or trauma. Uses a cane to walk The weakness in his left thigh is progressing. Has occassional muscle pain and cramps. Blood pressure is controlled. Compliant with his medication Pain under control with current regimen Follows with Dr Margarita Grizzle for his bladder cancer. Has follow up in Jan 2015  Review of Systems   Constitutional: Negative for fever, chills, activity change and appetite change.   HENT: Negative for congestion.    Respiratory: Negative for shortness of breath.    Cardiovascular: Negative for chest pain, palpitations and leg swelling.   Gastrointestinal: Negative for abdominal pain and abdominal distention.   Musculoskeletal: Positive for gait problem. Using a cane. See hpi Neurological: Negative for dizziness, syncope and headaches.   Hematological: Negative for adenopathy.   Psychiatric/Behavioral: Negative for confusion, sleep disturbance, dysphoric mood and agitation.  Past Medical History  Diagnosis Date  . Hypertension 08-20-11  . Dysrhythmia 08-20-11    skip beat, hx. RBBB  . GERD (gastroesophageal reflux disease) 08-20-11    controls with diet, hx. colon polyps(benign)  . Cancer 08-20-11    bladder cancer  . Arthritis   . Stroke 08-20-11    '08-Lt. sided weakness with residual > lt. arm.  . Chronic kidney disease     CKD stage III  .  Unspecified hereditary and idiopathic peripheral neuropathy   . Other specified cardiac dysrhythmias(427.89)   . Unspecified late effects of cerebrovascular disease   . Spasm of muscle   . Other abnormal blood chemistry   . Muscle weakness (generalized)   . Unspecified vitamin D deficiency   . Other chronic pain   . Chronic kidney disease, stage III (moderate)   . Complications affecting other specified body systems, hypertension   . Routine general medical examination at a health care facility   . Screening for diabetes mellitus   . Malignant neoplasm of bladder, part unspecified   . Reflux esophagitis   . Unspecified constipation    Current Outpatient Prescriptions on File Prior to Visit  Medication Sig Dispense Refill  . amLODipine (NORVASC) 10 MG tablet TAKE 1 TABLET BY MOUTH ONCE DAILY FOR BLOOD PRESSURE  30 tablet  6  . aspirin 325 MG tablet Take 1 tablet (325 mg total) by mouth daily.  90 tablet  3  . Cholecalciferol 50000 UNITS capsule Take one tablet once a week for vitamin d supplement      . gabapentin (NEURONTIN) 100 MG capsule Take 1 capsule (100 mg total) by mouth 3 (three) times daily. Take one tablet three times a day  90 capsule  3  . lisinopril (PRINIVIL,ZESTRIL) 10 MG tablet Take 1 tablet (10 mg total) by mouth daily.  90 tablet  3  . metoprolol tartrate (LOPRESSOR) 25 MG tablet TAKE  2 TABLETS BY MOUTH IN THE MORNING AND 1 TABLET BY MOUTH AT NIGHT  90 tablet  3  . Multiple Vitamins-Minerals (DAILY MULTI) TABS Take by mouth. Take one tablet once a day      . omeprazole (PRILOSEC) 20 MG capsule TAKE 1 CAPSULE BY MOUTH EVERY DAY  30 capsule  6  . OxyCODONE HCl, Abuse Deter, 5 MG TABA Take 5 mg by mouth 2 (two) times daily. As needed for pain  60 tablet  0  . pravastatin (PRAVACHOL) 40 MG tablet TAKE 1 TABLET BY MOUTH EVERY NIGHT AT BEDTIME FOR CHOLESTEROL  30 tablet  6  . [DISCONTINUED] cloNIDine (CATAPRES) 0.1 MG tablet Take 0.1 mg by mouth 2 (two) times daily.       .  [DISCONTINUED] doxazosin (CARDURA) 4 MG tablet Take 4 mg by mouth at bedtime.       . [DISCONTINUED] enoxaparin (LOVENOX) 40 MG/0.4ML SOLN Inject 0.4 mLs (40 mg total) into the skin daily.  11.2 mL  3   No current facility-administered medications on file prior to visit.     BP 122/84  Pulse 71  Temp(Src) 98.2 F (36.8 C) (Oral)  Wt 167 lb 9.6 oz (76.023 kg)  BMI 25.49 kg/m2  SpO2 99%  Physical exam- Constitutional: He is oriented to person, place, and time. He appears well-developed and well-nourished. No distress.   HENT:   Head: Normocephalic and atraumatic.   Mouth/Throat: Oropharynx is clear and moist.   Eyes: Conjunctivae are normal. Pupils are equal, round, and reactive to light.   Cardiovascular: Normal rate and regular rhythm.    No murmur heard. Pulmonary/Chest: Effort normal and breath sounds normal.   Abdominal: Soft. Bowel sounds are normal. He exhibits no distension. There is no tenderness. There is no guarding.  Urostomy bag in place. Skin around is clean and dry Musculoskeletal: Normal range of motion on right side Left sided weakness in UE and LE strength of 3/5 in UE ad 4/5 in LE. Normal sensation to fine touch, pin prick and vibration. Left leg muscle group atrophy. Left foot inversion present. Good dorsalis pedis pulses Neurological: He is alert and oriented to person, place, and time. No cranial nerve deficit.   Skin: Skin is warm and dry. He is not diaphoretic.  Psychiatric: He has a normal mood and affect. His behavior is normal.   labs  CBC    Component Value Date/Time   WBC 5.8 02/19/2013 0847   WBC 6.5 12/12/2011 0444   RBC 4.84 02/19/2013 0847   RBC 3.31* 12/12/2011 0444   HGB 14.3 02/19/2013 0847   HCT 42.7 02/19/2013 0847   PLT 216 12/12/2011 0444   MCV 88 02/19/2013 0847   MCH 29.5 02/19/2013 0847   MCH 28.7 12/12/2011 0444   MCHC 33.5 02/19/2013 0847   MCHC 32.0 12/12/2011 0444   RDW 15.0 02/19/2013 0847   RDW 15.3 12/12/2011 0444   LYMPHSABS 2.0  02/19/2013 0847   LYMPHSABS 0.9 12/09/2011 0042   MONOABS 0.3 12/09/2011 0042   EOSABS 0.1 02/19/2013 0847   EOSABS 0.0 12/09/2011 0042   BASOSABS 0.0 02/19/2013 0847   BASOSABS 0.0 12/09/2011 0042    CMP     Component Value Date/Time   NA 144 02/19/2013 0847   NA 141 12/13/2011 0435   K 4.3 02/19/2013 0847   CL 109* 02/19/2013 0847   CO2 18 02/19/2013 0847   GLUCOSE 84 02/19/2013 0847   GLUCOSE 90 12/13/2011 0435   BUN 28* 02/19/2013  0847   BUN 19 12/13/2011 0435   CREATININE 1.94* 02/19/2013 0847   CALCIUM 9.7 02/19/2013 0847   PROT 8.0 12/09/2011 0042   ALBUMIN 4.1 12/09/2011 0042   AST 14 12/09/2011 0042   ALT 14 12/09/2011 0042   ALKPHOS 76 12/09/2011 0042   BILITOT 0.6 12/09/2011 0042   GFRNONAA 36* 02/19/2013 0847   GFRAA 42* 02/19/2013 0847    Assessment/plan  Left leg weakness- worsening, likely from disuse atrophy and residual paresis from stroke in past. will need rehabilitation but cost factor is an issue here. Will have him seen by orthopedic for assessment for a leg brace (upto his thigh compared to the splint he has at present) with locking effect to help with the buckling and to provide more stability with his gait. Use cane at present.   HTN- blood pressure is well controlled. Continue amlodipine, lopressor and lisinopril. Check bmp today  cva late effect- continue aspirin and bp medication. continue statin. Check flp today  Hyperlipidemia- check flp, continue statin  Chronic pain- improved. Continue Gabapentin and oxycocodone   Influenza vaccine to be provided today

## 2013-06-17 LAB — BASIC METABOLIC PANEL
BUN: 26 mg/dL (ref 8–27)
CO2: 19 mmol/L (ref 18–29)
Calcium: 9.5 mg/dL (ref 8.6–10.2)
Creatinine, Ser: 1.89 mg/dL — ABNORMAL HIGH (ref 0.76–1.27)
Glucose: 86 mg/dL (ref 65–99)
Sodium: 143 mmol/L (ref 134–144)

## 2013-06-17 LAB — LIPID PANEL
Cholesterol, Total: 177 mg/dL (ref 100–199)
HDL: 56 mg/dL (ref >39)
Triglycerides: 81 mg/dL (ref 0–149)

## 2013-06-23 ENCOUNTER — Other Ambulatory Visit: Payer: Self-pay | Admitting: *Deleted

## 2013-06-23 MED ORDER — OXYCODONE HCL 5 MG PO TABA: 5.0000 mg | ORAL_TABLET | Freq: Two times a day (BID) | ORAL | Status: DC

## 2013-07-01 ENCOUNTER — Ambulatory Visit: Payer: Medicare Other

## 2013-07-23 ENCOUNTER — Other Ambulatory Visit: Payer: Self-pay

## 2013-07-23 MED ORDER — OXYCODONE HCL 5 MG PO TABA: 5.0000 mg | ORAL_TABLET | Freq: Two times a day (BID) | ORAL | Status: DC

## 2013-07-23 NOTE — Telephone Encounter (Signed)
Last filled 06/23/13 

## 2013-08-20 ENCOUNTER — Other Ambulatory Visit: Payer: Self-pay | Admitting: *Deleted

## 2013-08-20 DIAGNOSIS — I6990 Unspecified sequelae of unspecified cerebrovascular disease: Secondary | ICD-10-CM

## 2013-08-20 DIAGNOSIS — I10 Essential (primary) hypertension: Secondary | ICD-10-CM

## 2013-08-20 MED ORDER — OXYCODONE HCL 5 MG PO TABA: 5.0000 mg | ORAL_TABLET | Freq: Two times a day (BID) | ORAL | Status: DC

## 2013-08-20 MED ORDER — ASPIRIN 325 MG PO TABS: 325.0000 mg | ORAL_TABLET | Freq: Every day | ORAL | Status: DC

## 2013-09-01 ENCOUNTER — Other Ambulatory Visit: Payer: Self-pay | Admitting: Internal Medicine

## 2013-09-21 ENCOUNTER — Other Ambulatory Visit (HOSPITAL_COMMUNITY): Payer: Self-pay | Admitting: Urology

## 2013-09-21 DIAGNOSIS — C679 Malignant neoplasm of bladder, unspecified: Secondary | ICD-10-CM

## 2013-09-22 ENCOUNTER — Ambulatory Visit (INDEPENDENT_AMBULATORY_CARE_PROVIDER_SITE_OTHER): Payer: Medicare HMO | Admitting: Internal Medicine

## 2013-09-22 ENCOUNTER — Encounter: Payer: Self-pay | Admitting: Internal Medicine

## 2013-09-22 VITALS — BP 138/84 | HR 60 | Resp 10 | Ht 66.5 in | Wt 174.0 lb

## 2013-09-22 DIAGNOSIS — N183 Chronic kidney disease, stage 3 unspecified: Secondary | ICD-10-CM

## 2013-09-22 DIAGNOSIS — G894 Chronic pain syndrome: Secondary | ICD-10-CM

## 2013-09-22 DIAGNOSIS — E559 Vitamin D deficiency, unspecified: Secondary | ICD-10-CM | POA: Insufficient documentation

## 2013-09-22 DIAGNOSIS — E785 Hyperlipidemia, unspecified: Secondary | ICD-10-CM

## 2013-09-22 DIAGNOSIS — Z0000 Encounter for general adult medical examination without abnormal findings: Secondary | ICD-10-CM | POA: Insufficient documentation

## 2013-09-22 DIAGNOSIS — I10 Essential (primary) hypertension: Secondary | ICD-10-CM

## 2013-09-22 DIAGNOSIS — I6990 Unspecified sequelae of unspecified cerebrovascular disease: Secondary | ICD-10-CM

## 2013-09-22 DIAGNOSIS — K219 Gastro-esophageal reflux disease without esophagitis: Secondary | ICD-10-CM | POA: Insufficient documentation

## 2013-09-22 MED ORDER — OXYCODONE HCL 5 MG PO TABA: 5.0000 mg | ORAL_TABLET | Freq: Two times a day (BID) | ORAL | Status: DC

## 2013-09-22 NOTE — Progress Notes (Signed)
Patient ID: William Collier, male   DOB: [birth year], 63 y.o.   MRN: [id number]     Chief Complaint  Patient presents with  . Annual Exam    Yearly Check-up, not fasting today   . Medication Refill    Renew Oxycodone RX     Allergies  Allergen Reactions  . Penicillins Other (See Comments)    HAD WHEN WAS A CHILD   . Pregabalin Swelling   HPI 63 y/o male pt is here for his yearly exam. He denies any complaints. He is uptodate with his immunization, colonoscopy. He continues to smoke cigarettes and occassionally weed. 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 He uses a splint for his thigh and ankle. No recent falls or trauma. Uses a cane to walk Blood pressure is controlled. Compliant with his medication Pain under control with current regimen Follows with Dr Jasmine December for his bladder cancer. Has follow up   Review of Systems   Constitutional: Negative for fever, chills, activity change and appetite change.   HENT: Negative for congestion.    Respiratory: Negative for shortness of breath.    Cardiovascular: Negative for chest pain, palpitations and leg swelling.   Gastrointestinal: Negative for abdominal pain and abdominal distention.   Musculoskeletal: Positive for gait problem. Using a cane. See hpi Neurological: Negative for dizziness, syncope and headaches.   Hematological: Negative for adenopathy.   Psychiatric/Behavioral: Negative for confusion, sleep disturbance, dysphoric mood and agitation.  Past Medical History  Diagnosis Date  . Hypertension 08-20-11  . Dysrhythmia 08-20-11    skip beat, hx. RBBB  . GERD (gastroesophageal reflux disease) 08-20-11    controls with diet, hx. colon polyps(benign)  . Cancer 08-20-11    bladder cancer  . Arthritis   . Stroke 08-20-11    '08-Lt. sided weakness with residual > lt. arm.  . Chronic kidney disease     CKD stage III  . Unspecified hereditary and idiopathic peripheral neuropathy   . Other  specified cardiac dysrhythmias(427.89)   . Unspecified late effects of cerebrovascular disease   . Spasm of muscle   . Other abnormal blood chemistry   . Muscle weakness (generalized)   . Unspecified vitamin D deficiency   . Other chronic pain   . Chronic kidney disease, stage III (moderate)   . Complications affecting other specified body systems, hypertension   . Routine general medical examination at a health care facility   . Screening for diabetes mellitus   . Malignant neoplasm of bladder, part unspecified   . Reflux esophagitis   . Unspecified constipation    Past Surgical History  Procedure Laterality Date  . Bladder surgery  08-20-11    07-01-11 bladder surgery, past hx. lithotripsy  . Shoulder arthrotomy  08-20-11    left shoulder with retained pins  . Knee arthroscopy  08-20-11    bil. knee scopes-torn ligament  . Cystoscopy w/ ureteral stent placement  08/21/2011    Procedure: CYSTOSCOPY WITH RETROGRADE PYELOGRAM/URETERAL STENT PLACEMENT;  Surgeon: Molli Hazard, MD;  Location: [facility];  Service: Urology;  Laterality: Left;  . Cystoscopy with biopsy  08/21/2011    Procedure: CYSTOSCOPY WITH BIOPSY;  Surgeon: Molli Hazard, MD;  Location: [facility];  Service: Urology;  Laterality: N/A;  . Transurethral resection of bladder tumor  08/21/2011    Procedure: TRANSURETHRAL RESECTION OF BLADDER TUMOR (TURBT);  Surgeon: Molli Hazard, MD;  Location: [facility];  Service: Urology;  Laterality: N/A;  need PK gyrus  . Cystectomy  10/07/2011    Procedure: CYSTECTOMY COMPLETE;  Surgeon: Molli Hazard, MD;  Location: [facility];  Service: Urology;  Laterality: Bilateral;  Radical Cystoprostatectomy Bilateral Pelvic Lymph Node Dissection Formation Of Ileal Conduit   . Prostatectomy  10/07/2011    Procedure: PROSTATECTOMY;  Surgeon: Molli Hazard, MD;  Location: [facility];  Service: Urology;  Laterality: N/A;  . Lymph node dissection  10/07/2011    Procedure: LYMPH  NODE DISSECTION;  Surgeon: Molli Hazard, MD;  Location: [facility];  Service: Urology;  Laterality: Bilateral;   Current Outpatient Prescriptions on File Prior to Visit  Medication Sig Dispense Refill  . amLODipine (NORVASC) 10 MG tablet TAKE 1 TABLET BY MOUTH ONCE DAILY FOR BLOOD PRESSURE  30 tablet  6  . aspirin 325 MG tablet Take 1 tablet (325 mg total) by mouth daily.  90 tablet  3  . Cholecalciferol 50000 UNITS capsule Take one tablet once a week for vitamin d supplement      . gabapentin (NEURONTIN) 100 MG capsule Take 1 capsule (100 mg total) by mouth 3 (three) times daily. Take one tablet three times a day  90 capsule  3  . lisinopril (PRINIVIL,ZESTRIL) 10 MG tablet Take 1 tablet (10 mg total) by mouth daily.  90 tablet  3  . metoprolol tartrate (LOPRESSOR) 25 MG tablet TAKE 2 TABLETS BY MOUTH IN THE MORNING AND 1 TABLET BY MOUTH AT NIGHT  90 tablet  0  . Multiple Vitamins-Minerals (DAILY MULTI) TABS Take by mouth. Take one tablet once a day      . omeprazole (PRILOSEC) 20 MG capsule TAKE 1 CAPSULE BY MOUTH EVERY DAY  30 capsule  6  . pravastatin (PRAVACHOL) 40 MG tablet TAKE 1 TABLET BY MOUTH EVERY NIGHT AT BEDTIME FOR CHOLESTEROL  30 tablet  6  . diptheria-tetanus toxoids (DECAVAC) 2-2 LF/0.5ML injection Inject 0.5 mLs into the muscle once.  0.5 mL  0  . [DISCONTINUED] cloNIDine (CATAPRES) 0.1 MG tablet Take 0.1 mg by mouth 2 (two) times daily.       . [DISCONTINUED] doxazosin (CARDURA) 4 MG tablet Take 4 mg by mouth at bedtime.       . [DISCONTINUED] enoxaparin (LOVENOX) 40 MG/0.4ML SOLN Inject 0.4 mLs (40 mg total) into the skin daily.  11.2 mL  3   No current facility-administered medications on file prior to visit.    Physical exam BP 138/84  Pulse 60  Resp 10  Ht 5' 6.5" (1.689 m)  Wt 174 lb (78.926 kg)  BMI 27.67 kg/m2  SpO2 99%  Constitutional: He is oriented to person, place, and time. He appears well-developed and well-nourished. No distress.   HENT:   Head:  Normocephalic and atraumatic.   Mouth/Throat: Oropharynx is clear and moist.   Eyes: Conjunctivae are normal. Pupils are equal, round, and reactive to light.   Cardiovascular: Normal rate and regular rhythm.    No murmur heard. Pulmonary/Chest: Effort normal and breath sounds normal.   Abdominal: Soft. Bowel sounds are normal. He exhibits no distension. There is no tenderness. There is no guarding.  Urostomy bag in place. Skin around is clean and dry Musculoskeletal: Normal range of motion on right side Left sided weakness in UE and LE strength of 3/5 in UE ad 4/5 in LE. Normal sensation to fine touch, pin prick and vibration. Left leg muscle group atrophy. Left foot inversion present. Good dorsalis pedis pulses Neurological: He is alert and oriented  to person, place, and time. No cranial nerve deficit.  normal sensation to painprick and vibration Skin: Skin is warm and dry. He is not diaphoretic.  Psychiatric: He has a normal mood and affect. His behavior is normal.   Labs- CBC    Component Value Date/Time   WBC 5.8 02/19/2013 0847   WBC 6.5 12/12/2011 0444   RBC 4.84 02/19/2013 0847   RBC 3.31* 12/12/2011 0444   HGB 14.3 02/19/2013 0847   HCT 42.7 02/19/2013 0847   PLT 216 12/12/2011 0444   MCV 88 02/19/2013 0847   MCH 29.5 02/19/2013 0847   MCH 28.7 12/12/2011 0444   MCHC 33.5 02/19/2013 0847   MCHC 32.0 12/12/2011 0444   RDW 15.0 02/19/2013 0847   RDW 15.3 12/12/2011 0444   LYMPHSABS 2.0 02/19/2013 0847   LYMPHSABS 0.9 12/09/2011 0042   MONOABS 0.3 12/09/2011 0042   EOSABS 0.1 02/19/2013 0847   EOSABS 0.0 12/09/2011 0042   BASOSABS 0.0 02/19/2013 0847   BASOSABS 0.0 12/09/2011 0042    CMP     Component Value Date/Time   NA 143 06/16/2013 1056   NA 141 12/13/2011 0435   K 5.0 06/16/2013 1056   CL 106 06/16/2013 1056   CO2 19 06/16/2013 1056   GLUCOSE 86 06/16/2013 1056   GLUCOSE 90 12/13/2011 0435   BUN 26 06/16/2013 1056   BUN 19 12/13/2011 0435   CREATININE 1.89* 06/16/2013 1056    CALCIUM 9.5 06/16/2013 1056   PROT 8.0 12/09/2011 0042   ALBUMIN 4.1 12/09/2011 0042   AST 14 12/09/2011 0042   ALT 14 12/09/2011 0042   ALKPHOS 76 12/09/2011 0042   BILITOT 0.6 12/09/2011 0042   GFRNONAA 37* 06/16/2013 1056   GFRAA 43* 06/16/2013 1056   Lipid Panel     Component Value Date/Time   CHOL 122 02/08/2010 2316   TRIG 81 06/16/2013 1056   HDL 56 06/16/2013 1056   HDL 47 02/08/2010 2316   CHOLHDL 3.2 06/16/2013 1056   CHOLHDL 2.6 Ratio 02/08/2010 2316   VLDL 15 02/08/2010 2316   LDLCALC 105* 06/16/2013 1056   LDLCALC 60 02/08/2010 2316   Assessment/plan  1. HYPERTENSION blood pressure is well controlled. Continue amlodipine, lopressor and lisinopril  2. Late effects of CVA (cerebrovascular accident) Stable. Continue aspirin and bp medication. Continue statin  3. HYPERLIPIDEMIA Reviewed lipid panel. Continue pravachol  4. Chronic pain syndrome Stable with oxycodone, refill provided  5. GERD (gastroesophageal reflux disease) Continue prilosec, symptom under control  6. Unspecified vitamin D deficiency Continue vit d once a week  7. Routine general medical examination at a health care facility Reviewed past lab. uptodate with immunization and screening. counselling on diet, daily activiity, safe driving and skin care provided  Spent more than 50 minutes with the patient  8. CKD (chronic kidney disease) stage 3, GFR 30-59 ml/min Monitor renal function. Avoid NSAIDs. Monitor bp readings  Assessment/plan  Left leg weakness- worsening, likely from disuse atrophy and residual paresis from stroke in past. will need rehabilitation but cost factor is an issue here. Will have him seen by orthopedic for assessment for a leg brace (upto his thigh compared to the splint he has at present) with locking effect to help with the buckling and to provide more stability with his gait. Use cane at present.   HTN-  Check bmp today  cva late effect- continue aspirin and bp medication. continue  statin. Check flp today  Hyperlipidemia- check flp, continue statin  Chronic pain-  improved. Continue Gabapentin and oxycocodone   Influenza vaccine to be provided today

## 2013-09-24 ENCOUNTER — Ambulatory Visit (HOSPITAL_COMMUNITY)
Admission: RE | Admit: 2013-09-24 | Discharge: 2013-09-24 | Disposition: A | Discharge status: Home / Self Care | Payer: Medicare PPO | Source: Ambulatory Visit | Attending: Urology | Admitting: Urology

## 2013-09-24 DIAGNOSIS — C679 Malignant neoplasm of bladder, unspecified: Secondary | ICD-10-CM | POA: Insufficient documentation

## 2013-09-24 MED ORDER — DIATRIZOATE MEGLUMINE 30 % UR SOLN
Freq: Once | URETHRAL | Status: AC | PRN
  Administered 2013-09-24: 11:00:00

## 2013-09-30 ENCOUNTER — Other Ambulatory Visit: Payer: Self-pay | Admitting: Internal Medicine

## 2013-10-22 ENCOUNTER — Other Ambulatory Visit: Payer: Self-pay | Admitting: *Deleted

## 2013-10-22 MED ORDER — OXYCODONE HCL 5 MG PO TABA: 5.0000 mg | ORAL_TABLET | Freq: Two times a day (BID) | ORAL | Status: DC

## 2013-11-19 ENCOUNTER — Other Ambulatory Visit: Payer: Self-pay | Admitting: *Deleted

## 2013-11-19 MED ORDER — OXYCODONE HCL 5 MG PO TABA
5.0000 mg | ORAL_TABLET | Freq: Two times a day (BID) | ORAL | Status: DC
Start: 2013-11-19 — End: 2013-12-22

## 2013-12-22 ENCOUNTER — Ambulatory Visit (INDEPENDENT_AMBULATORY_CARE_PROVIDER_SITE_OTHER): Payer: Medicare HMO | Admitting: Internal Medicine

## 2013-12-22 ENCOUNTER — Encounter: Payer: Self-pay | Admitting: Internal Medicine

## 2013-12-22 VITALS — BP 124/82 | HR 60 | Temp 97.1°F | Wt 177.2 lb

## 2013-12-22 DIAGNOSIS — I10 Essential (primary) hypertension: Secondary | ICD-10-CM

## 2013-12-22 DIAGNOSIS — K219 Gastro-esophageal reflux disease without esophagitis: Secondary | ICD-10-CM

## 2013-12-22 DIAGNOSIS — I69959 Hemiplegia and hemiparesis following unspecified cerebrovascular disease affecting unspecified side: Secondary | ICD-10-CM

## 2013-12-22 DIAGNOSIS — I69359 Hemiplegia and hemiparesis following cerebral infarction affecting unspecified side: Secondary | ICD-10-CM

## 2013-12-22 DIAGNOSIS — F17200 Nicotine dependence, unspecified, uncomplicated: Secondary | ICD-10-CM

## 2013-12-22 DIAGNOSIS — Z436 Encounter for attention to other artificial openings of urinary tract: Secondary | ICD-10-CM

## 2013-12-22 DIAGNOSIS — E785 Hyperlipidemia, unspecified: Secondary | ICD-10-CM

## 2013-12-22 DIAGNOSIS — R29898 Other symptoms and signs involving the musculoskeletal system: Secondary | ICD-10-CM

## 2013-12-22 MED ORDER — OXYCODONE HCL 5 MG PO TABA: 5.0000 mg | ORAL_TABLET | Freq: Two times a day (BID) | ORAL | Status: DC

## 2013-12-22 NOTE — Progress Notes (Signed)
Patient ID: William Collier, male   DOB: Dec 31, 1950, 63 y.o.   MRN: [id number]    Chief Complaint  Patient presents with  . Medical Management of Chronic Issues    3 month f/u with no recent labs  . other    ? knee brace for LT leg  . Immunizations    declines Tdap & shingles vaccines   Allergies  Allergen Reactions  . Penicillins Other (See Comments)    HAD WHEN WAS A CHILD   . Pregabalin Swelling   HPI 63 y/o male pt is here for routine follow up visit. His PT referral was pending due to insurance issue. He continues to smoke cigarettes and occassionally weed. 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 He uses a splint for his leg No recent falls or trauma. Uses a cane to walk Blood pressure is controlled. Has stopped taking pravachol for now. He has also stopped taking gabapentin for 2 months. He would like to be on minimal medications as possible Pain under control with current regimen Follows with Dr Jasmine December for his bladder cancer.   Review of Systems   Constitutional: Negative for fever, chills, activity change and appetite change.   HENT: Negative for congestion.    Respiratory: Negative for shortness of breath.    Cardiovascular: Negative for chest pain, palpitations and leg swelling.   Gastrointestinal: Negative for abdominal pain and abdominal distention.   Musculoskeletal: Positive for gait problem. Using a cane. See hpi Neurological: Negative for dizziness, syncope and headaches.   Hematological: Negative for adenopathy.   Psychiatric/Behavioral: Negative for confusion, sleep disturbance, dysphoric mood and agitation.  Past Medical History  Diagnosis Date  . Hypertension 08-20-11  . Dysrhythmia 08-20-11    skip beat, hx. RBBB  . GERD (gastroesophageal reflux disease) 08-20-11    controls with diet, hx. colon polyps(benign)  . Cancer 08-20-11    bladder cancer  . Arthritis   . Stroke 08-20-11    '08-Lt. sided weakness with residual >  lt. arm.  . Chronic kidney disease     CKD stage III  . Unspecified hereditary and idiopathic peripheral neuropathy   . Other specified cardiac dysrhythmias(427.89)   . Unspecified late effects of cerebrovascular disease   . Spasm of muscle   . Other abnormal blood chemistry   . Muscle weakness (generalized)   . Unspecified vitamin D deficiency   . Other chronic pain   . Chronic kidney disease, stage III (moderate)   . Complications affecting other specified body systems, hypertension   . Routine general medical examination at a health care facility   . Screening for diabetes mellitus   . Malignant neoplasm of bladder, part unspecified   . Reflux esophagitis   . Unspecified constipation    Past Surgical History  Procedure Laterality Date  . Bladder surgery  08-20-11    07-01-11 bladder surgery, past hx. lithotripsy  . Shoulder arthrotomy  08-20-11    left shoulder with retained pins  . Knee arthroscopy  08-20-11    bil. knee scopes-torn ligament  . Cystoscopy w/ ureteral stent placement  08/21/2011    Procedure: CYSTOSCOPY WITH RETROGRADE PYELOGRAM/URETERAL STENT PLACEMENT;  Surgeon: Molli Hazard, MD;  Location: [facility];  Service: Urology;  Laterality: Left;  . Cystoscopy with biopsy  08/21/2011    Procedure: CYSTOSCOPY WITH BIOPSY;  Surgeon: Molli Hazard, MD;  Location: [facility];  Service: Urology;  Laterality: N/A;  . Transurethral resection of bladder  tumor  08/21/2011    Procedure: TRANSURETHRAL RESECTION OF BLADDER TUMOR (TURBT);  Surgeon: Molli Hazard, MD;  Location: [facility];  Service: Urology;  Laterality: N/A;  need PK gyrus  . Cystectomy  10/07/2011    Procedure: CYSTECTOMY COMPLETE;  Surgeon: Molli Hazard, MD;  Location: [facility];  Service: Urology;  Laterality: Bilateral;  Radical Cystoprostatectomy Bilateral Pelvic Lymph Node Dissection Formation Of Ileal Conduit   . Prostatectomy  10/07/2011    Procedure: PROSTATECTOMY;  Surgeon: Molli Hazard, MD;  Location: [facility];  Service: Urology;  Laterality: N/A;  . Lymph node dissection  10/07/2011    Procedure: LYMPH NODE DISSECTION;  Surgeon: Molli Hazard, MD;  Location: [facility];  Service: Urology;  Laterality: Bilateral;   Current Outpatient Prescriptions on File Prior to Visit  Medication Sig Dispense Refill  . aspirin 325 MG tablet Take 1 tablet (325 mg total) by mouth daily.  90 tablet  3  . Multiple Vitamins-Minerals (DAILY MULTI) TABS Take by mouth. Take one tablet once a day      . [DISCONTINUED] cloNIDine (CATAPRES) 0.1 MG tablet Take 0.1 mg by mouth 2 (two) times daily.       . [DISCONTINUED] doxazosin (CARDURA) 4 MG tablet Take 4 mg by mouth at bedtime.       . [DISCONTINUED] enoxaparin (LOVENOX) 40 MG/0.4ML SOLN Inject 0.4 mLs (40 mg total) into the skin daily.  11.2 mL  3   No current facility-administered medications on file prior to visit.   Physical exam BP 124/82  Pulse 60  Temp(Src) 97.1 F (36.2 C) (Oral)  Wt 177 lb 3.2 oz (80.377 kg)  SpO2 99%  Constitutional: He is oriented to person, place, and time. He appears well-developed and well-nourished. No distress.   HENT:   Head: Normocephalic and atraumatic.   Mouth/Throat: Oropharynx is clear and moist.   Eyes: Conjunctivae are normal. Pupils are equal, round, and reactive to light.   Cardiovascular: Normal rate and regular rhythm.    No murmur heard. Pulmonary/Chest: Effort normal and breath sounds normal.   Abdominal: Soft. Bowel sounds are normal. He exhibits no distension. There is no tenderness. There is no guarding.  Urostomy bag in place. Skin around is clean and dry Musculoskeletal: Normal range of motion on right side Left sided weakness in UE and LE strength of 3/5 in UE ad 4/5 in LE. Normal sensation to fine touch, pin prick and vibration. Left leg muscle group atrophy. Left foot inversion present. Good dorsalis pedis pulses Neurological: He is alert and oriented to person, place, and  time. No cranial nerve deficit.  normal sensation to painprick and vibration Skin: Skin is warm and dry. He is not diaphoretic.  Psychiatric: He has a normal mood and affect. His behavior is normal.    Assessment/plan  1. Tobacco use disorder Encouraged to stop smoking, he is trying to cut down. Explained the risk associated with CAD and another stroke  2. Other and unspecified hyperlipidemia Has stopped pravachol by him self. Dietary rec provided - Lipid Panel  3. Essential hypertension, benign Stable bp readings. Continue amlodipine 10 mg daily with lopressor 50 mg in am and 25 mg in pm - Basic Metabolic Panel - CBC with Differential  4. CVA, old, hemiparesis Continue aspirin and bp medications. Recommended statin but pt does not want this at present. Continue oxycodone for pain. Not on gabapentin any further  5. GERD (gastroesophageal reflux disease) No further heart burn. Consider stopping ppi  next visit  6. Left leg weakness - Ambulatory referral to Physical Therapy to assess for need for a brace for his left leg support and strengthening exercises  7. Attention to urostomy Follows with urology

## 2013-12-23 ENCOUNTER — Other Ambulatory Visit: Payer: Self-pay | Admitting: Nurse Practitioner

## 2013-12-23 ENCOUNTER — Other Ambulatory Visit: Payer: Self-pay | Admitting: Internal Medicine

## 2013-12-23 ENCOUNTER — Encounter: Payer: Self-pay | Admitting: *Deleted

## 2013-12-23 LAB — CBC WITH DIFFERENTIAL/PLATELET
BASOS: 0 %
Basophils Absolute: 0 10*3/uL (ref 0.0–0.2)
EOS: 1 %
Eosinophils Absolute: 0.1 10*3/uL (ref 0.0–0.4)
HEMATOCRIT: 44.4 % (ref 37.5–51.0)
HEMOGLOBIN: 15 g/dL (ref 12.6–17.7)
IMMATURE GRANULOCYTES: 0 %
Immature Grans (Abs): 0 10*3/uL (ref 0.0–0.1)
LYMPHS ABS: 1.9 10*3/uL (ref 0.7–3.1)
Lymphs: 33 %
MCH: 30 pg (ref 26.6–33.0)
MCHC: 33.8 g/dL (ref 31.5–35.7)
MCV: 89 fL (ref 79–97)
MONOCYTES: 7 %
Monocytes Absolute: 0.4 10*3/uL (ref 0.1–0.9)
NEUTROS ABS: 3.3 10*3/uL (ref 1.4–7.0)
Neutrophils Relative %: 59 %
RBC: 5 x10E6/uL (ref 4.14–5.80)
RDW: 15.7 % — ABNORMAL HIGH (ref 12.3–15.4)
WBC: 5.6 10*3/uL (ref 3.4–10.8)

## 2013-12-23 LAB — LIPID PANEL
Chol/HDL Ratio: 2.9 ratio units (ref 0.0–5.0)
Cholesterol, Total: 171 mg/dL (ref 100–199)
HDL: 58 mg/dL (ref >39)
LDL Calculated: 99 mg/dL (ref 0–99)
Triglycerides: 71 mg/dL (ref 0–149)
VLDL Cholesterol Cal: 14 mg/dL (ref 5–40)

## 2013-12-23 LAB — BASIC METABOLIC PANEL
BUN/Creatinine Ratio: 12 (ref 10–22)
BUN: 21 mg/dL (ref 8–27)
CALCIUM: 9.6 mg/dL (ref 8.6–10.2)
CHLORIDE: 106 mmol/L (ref 97–108)
CO2: 22 mmol/L (ref 18–29)
Creatinine, Ser: 1.71 mg/dL — ABNORMAL HIGH (ref 0.76–1.27)
GFR calc Af Amer: 48 mL/min/{1.73_m2} — ABNORMAL LOW (ref >59)
GFR calc non Af Amer: 42 mL/min/{1.73_m2} — ABNORMAL LOW (ref >59)
Glucose: 87 mg/dL (ref 65–99)
Potassium: 4.9 mmol/L (ref 3.5–5.2)
SODIUM: 144 mmol/L (ref 134–144)

## 2014-01-18 ENCOUNTER — Ambulatory Visit
Payer: Medicare PPO | Attending: Rehabilitative and Restorative Service Providers" | Admitting: Rehabilitative and Restorative Service Providers"

## 2014-01-20 ENCOUNTER — Other Ambulatory Visit: Payer: Self-pay | Admitting: *Deleted

## 2014-01-20 MED ORDER — OXYCODONE HCL 5 MG PO TABA: 5.0000 mg | ORAL_TABLET | Freq: Two times a day (BID) | ORAL | Status: DC

## 2014-02-06 ENCOUNTER — Emergency Department (HOSPITAL_COMMUNITY): Payer: Medicare PPO

## 2014-02-06 ENCOUNTER — Inpatient Hospital Stay (HOSPITAL_COMMUNITY): Payer: Medicare PPO

## 2014-02-06 ENCOUNTER — Encounter (HOSPITAL_COMMUNITY): Payer: Self-pay | Admitting: Emergency Medicine

## 2014-02-06 ENCOUNTER — Inpatient Hospital Stay (HOSPITAL_COMMUNITY)
Admission: EM | Admit: 2014-02-06 | Discharge: 2014-02-14 | DRG: 870 | Disposition: A | Discharge status: Hospice | Payer: Medicare PPO | Attending: Internal Medicine | Admitting: Internal Medicine

## 2014-02-06 DIAGNOSIS — Z23 Encounter for immunization: Secondary | ICD-10-CM

## 2014-02-06 DIAGNOSIS — I472 Ventricular tachycardia, unspecified: Secondary | ICD-10-CM | POA: Diagnosis not present

## 2014-02-06 DIAGNOSIS — M79609 Pain in unspecified limb: Secondary | ICD-10-CM

## 2014-02-06 DIAGNOSIS — I69359 Hemiplegia and hemiparesis following cerebral infarction affecting unspecified side: Secondary | ICD-10-CM

## 2014-02-06 DIAGNOSIS — I69959 Hemiplegia and hemiparesis following unspecified cerebrovascular disease affecting unspecified side: Secondary | ICD-10-CM

## 2014-02-06 DIAGNOSIS — N289 Disorder of kidney and ureter, unspecified: Secondary | ICD-10-CM

## 2014-02-06 DIAGNOSIS — E559 Vitamin D deficiency, unspecified: Secondary | ICD-10-CM | POA: Diagnosis present

## 2014-02-06 DIAGNOSIS — I10 Essential (primary) hypertension: Secondary | ICD-10-CM | POA: Diagnosis present

## 2014-02-06 DIAGNOSIS — H1130 Conjunctival hemorrhage, unspecified eye: Secondary | ICD-10-CM | POA: Diagnosis present

## 2014-02-06 DIAGNOSIS — Z7982 Long term (current) use of aspirin: Secondary | ICD-10-CM

## 2014-02-06 DIAGNOSIS — I4729 Other ventricular tachycardia: Secondary | ICD-10-CM | POA: Diagnosis not present

## 2014-02-06 DIAGNOSIS — K219 Gastro-esophageal reflux disease without esophagitis: Secondary | ICD-10-CM

## 2014-02-06 DIAGNOSIS — F17200 Nicotine dependence, unspecified, uncomplicated: Secondary | ICD-10-CM

## 2014-02-06 DIAGNOSIS — M25519 Pain in unspecified shoulder: Secondary | ICD-10-CM

## 2014-02-06 DIAGNOSIS — M129 Arthropathy, unspecified: Secondary | ICD-10-CM | POA: Diagnosis present

## 2014-02-06 DIAGNOSIS — E870 Hyperosmolality and hypernatremia: Secondary | ICD-10-CM | POA: Diagnosis present

## 2014-02-06 DIAGNOSIS — Z66 Do not resuscitate: Secondary | ICD-10-CM | POA: Diagnosis not present

## 2014-02-06 DIAGNOSIS — L03039 Cellulitis of unspecified toe: Secondary | ICD-10-CM

## 2014-02-06 DIAGNOSIS — G609 Hereditary and idiopathic neuropathy, unspecified: Secondary | ICD-10-CM | POA: Diagnosis present

## 2014-02-06 DIAGNOSIS — N183 Chronic kidney disease, stage 3 unspecified: Secondary | ICD-10-CM | POA: Diagnosis present

## 2014-02-06 DIAGNOSIS — Z833 Family history of diabetes mellitus: Secondary | ICD-10-CM

## 2014-02-06 DIAGNOSIS — Z515 Encounter for palliative care: Secondary | ICD-10-CM

## 2014-02-06 DIAGNOSIS — Z8249 Family history of ischemic heart disease and other diseases of the circulatory system: Secondary | ICD-10-CM

## 2014-02-06 DIAGNOSIS — Z9079 Acquired absence of other genital organ(s): Secondary | ICD-10-CM

## 2014-02-06 DIAGNOSIS — K5730 Diverticulosis of large intestine without perforation or abscess without bleeding: Secondary | ICD-10-CM

## 2014-02-06 DIAGNOSIS — K648 Other hemorrhoids: Secondary | ICD-10-CM

## 2014-02-06 DIAGNOSIS — N179 Acute kidney failure, unspecified: Secondary | ICD-10-CM

## 2014-02-06 DIAGNOSIS — M1990 Unspecified osteoarthritis, unspecified site: Secondary | ICD-10-CM

## 2014-02-06 DIAGNOSIS — G9340 Encephalopathy, unspecified: Secondary | ICD-10-CM | POA: Diagnosis present

## 2014-02-06 DIAGNOSIS — I6340 Cerebral infarction due to embolism of unspecified cerebral artery: Secondary | ICD-10-CM | POA: Diagnosis present

## 2014-02-06 DIAGNOSIS — Z906 Acquired absence of other parts of urinary tract: Secondary | ICD-10-CM

## 2014-02-06 DIAGNOSIS — A419 Sepsis, unspecified organism: Principal | ICD-10-CM | POA: Diagnosis present

## 2014-02-06 DIAGNOSIS — J9600 Acute respiratory failure, unspecified whether with hypoxia or hypercapnia: Secondary | ICD-10-CM | POA: Diagnosis present

## 2014-02-06 DIAGNOSIS — R29898 Other symptoms and signs involving the musculoskeletal system: Secondary | ICD-10-CM

## 2014-02-06 DIAGNOSIS — Z8551 Personal history of malignant neoplasm of bladder: Secondary | ICD-10-CM

## 2014-02-06 DIAGNOSIS — I6350 Cerebral infarction due to unspecified occlusion or stenosis of unspecified cerebral artery: Secondary | ICD-10-CM

## 2014-02-06 DIAGNOSIS — Z978 Presence of other specified devices: Secondary | ICD-10-CM

## 2014-02-06 DIAGNOSIS — I639 Cerebral infarction, unspecified: Secondary | ICD-10-CM | POA: Diagnosis present

## 2014-02-06 DIAGNOSIS — I6990 Unspecified sequelae of unspecified cerebrovascular disease: Secondary | ICD-10-CM

## 2014-02-06 DIAGNOSIS — I4510 Unspecified right bundle-branch block: Secondary | ICD-10-CM | POA: Diagnosis present

## 2014-02-06 DIAGNOSIS — F1210 Cannabis abuse, uncomplicated: Secondary | ICD-10-CM | POA: Diagnosis present

## 2014-02-06 DIAGNOSIS — K562 Volvulus: Secondary | ICD-10-CM

## 2014-02-06 DIAGNOSIS — Z932 Ileostomy status: Secondary | ICD-10-CM

## 2014-02-06 DIAGNOSIS — E860 Dehydration: Secondary | ICD-10-CM

## 2014-02-06 DIAGNOSIS — E785 Hyperlipidemia, unspecified: Secondary | ICD-10-CM | POA: Diagnosis present

## 2014-02-06 DIAGNOSIS — I129 Hypertensive chronic kidney disease with stage 1 through stage 4 chronic kidney disease, or unspecified chronic kidney disease: Secondary | ICD-10-CM | POA: Diagnosis present

## 2014-02-06 DIAGNOSIS — E875 Hyperkalemia: Secondary | ICD-10-CM | POA: Diagnosis present

## 2014-02-06 DIAGNOSIS — Z87891 Personal history of nicotine dependence: Secondary | ICD-10-CM

## 2014-02-06 DIAGNOSIS — Z79899 Other long term (current) drug therapy: Secondary | ICD-10-CM

## 2014-02-06 DIAGNOSIS — Z436 Encounter for attention to other artificial openings of urinary tract: Secondary | ICD-10-CM

## 2014-02-06 DIAGNOSIS — G936 Cerebral edema: Secondary | ICD-10-CM | POA: Diagnosis present

## 2014-02-06 DIAGNOSIS — G894 Chronic pain syndrome: Secondary | ICD-10-CM

## 2014-02-06 DIAGNOSIS — Z0000 Encounter for general adult medical examination without abnormal findings: Secondary | ICD-10-CM

## 2014-02-06 LAB — COMPREHENSIVE METABOLIC PANEL
ALK PHOS: 89 U/L (ref 39–117)
ALT: 54 U/L — ABNORMAL HIGH (ref 0–53)
AST: 169 U/L — AB (ref 0–37)
Albumin: 4 g/dL (ref 3.5–5.2)
BILIRUBIN TOTAL: 1.2 mg/dL (ref 0.3–1.2)
BUN: 27 mg/dL — ABNORMAL HIGH (ref 6–23)
CHLORIDE: 109 meq/L (ref 96–112)
CO2: 19 meq/L (ref 19–32)
Calcium: 9.8 mg/dL (ref 8.4–10.5)
Creatinine, Ser: 1.66 mg/dL — ABNORMAL HIGH (ref 0.50–1.35)
GFR calc Af Amer: 49 mL/min — ABNORMAL LOW (ref >90)
GFR calc non Af Amer: 42 mL/min — ABNORMAL LOW (ref >90)
Glucose, Bld: 162 mg/dL — ABNORMAL HIGH (ref 70–99)
Potassium: 5.4 mEq/L — ABNORMAL HIGH (ref 3.7–5.3)
Sodium: 146 mEq/L (ref 137–147)
Total Protein: 8.4 g/dL — ABNORMAL HIGH (ref 6.0–8.3)

## 2014-02-06 LAB — I-STAT ARTERIAL BLOOD GAS, ED
ACID-BASE DEFICIT: 2 mmol/L (ref 0.0–2.0)
ACID-BASE DEFICIT: 1 mmol/L (ref 0.0–2.0)
BICARBONATE: 24.5 meq/L — AB (ref 20.0–24.0)
Bicarbonate: 24 mEq/L (ref 20.0–24.0)
O2 Saturation: 53 %
O2 Saturation: 100 %
PH ART: 7.296 — AB (ref 7.350–7.450)
PH ART: 7.376 (ref 7.350–7.450)
PO2 ART: 526 mmHg — AB (ref 80.0–100.0)
TCO2: 26 mmol/L (ref 0–100)
TCO2: 25 mmol/L (ref 0–100)
pCO2 arterial: 50.3 mmHg — ABNORMAL HIGH (ref 35.0–45.0)
pCO2 arterial: 40.9 mmHg (ref 35.0–45.0)
pO2, Arterial: 32 mmHg — CL (ref 80.0–100.0)

## 2014-02-06 LAB — PROTIME-INR
INR: 1.24 (ref 0.00–1.49)
Prothrombin Time: 15.3 seconds — ABNORMAL HIGH (ref 11.6–15.2)

## 2014-02-06 LAB — DIFFERENTIAL
BASOS PCT: 0 % (ref 0–1)
Basophils Absolute: 0 10*3/uL (ref 0.0–0.1)
EOS ABS: 0 10*3/uL (ref 0.0–0.7)
Eosinophils Relative: 0 % (ref 0–5)
Lymphocytes Relative: 10 % — ABNORMAL LOW (ref 12–46)
Lymphs Abs: 2.1 10*3/uL (ref 0.7–4.0)
Monocytes Absolute: 1.5 10*3/uL — ABNORMAL HIGH (ref 0.1–1.0)
Monocytes Relative: 8 % (ref 3–12)
NEUTROS PCT: 82 % — AB (ref 43–77)
Neutro Abs: 17 10*3/uL — ABNORMAL HIGH (ref 1.7–7.7)

## 2014-02-06 LAB — CBG MONITORING, ED: Glucose-Capillary: 144 mg/dL — ABNORMAL HIGH (ref 70–99)

## 2014-02-06 LAB — URINALYSIS, ROUTINE W REFLEX MICROSCOPIC
Bilirubin Urine: NEGATIVE
Glucose, UA: NEGATIVE mg/dL
Ketones, ur: NEGATIVE mg/dL
NITRITE: NEGATIVE
Protein, ur: 30 mg/dL — AB
SPECIFIC GRAVITY, URINE: 1.013 (ref 1.005–1.030)
UROBILINOGEN UA: 0.2 mg/dL (ref 0.0–1.0)
pH: 6.5 (ref 5.0–8.0)

## 2014-02-06 LAB — CBC
HCT: 45.7 % (ref 39.0–52.0)
Hemoglobin: 15.8 g/dL (ref 13.0–17.0)
MCH: 31.3 pg (ref 26.0–34.0)
MCHC: 34.6 g/dL (ref 30.0–36.0)
MCV: 90.5 fL (ref 78.0–100.0)
PLATELETS: 278 10*3/uL (ref 150–400)
RBC: 5.05 MIL/uL (ref 4.22–5.81)
RDW: 15 % (ref 11.5–15.5)
WBC: 20.6 10*3/uL — AB (ref 4.0–10.5)

## 2014-02-06 LAB — GLUCOSE, CAPILLARY: Glucose-Capillary: 102 mg/dL — ABNORMAL HIGH (ref 70–99)

## 2014-02-06 LAB — RAPID URINE DRUG SCREEN, HOSP PERFORMED
Amphetamines: NOT DETECTED
BARBITURATES: NOT DETECTED
Benzodiazepines: NOT DETECTED
Cocaine: NOT DETECTED
OPIATES: NOT DETECTED
TETRAHYDROCANNABINOL: POSITIVE — AB

## 2014-02-06 LAB — I-STAT CHEM 8, ED
BUN: 42 mg/dL — AB (ref 6–23)
CHLORIDE: 113 meq/L — AB (ref 96–112)
Calcium, Ion: 1.22 mmol/L (ref 1.13–1.30)
Creatinine, Ser: 1.8 mg/dL — ABNORMAL HIGH (ref 0.50–1.35)
Glucose, Bld: 157 mg/dL — ABNORMAL HIGH (ref 70–99)
HCT: 51 % (ref 39.0–52.0)
Hemoglobin: 17.3 g/dL — ABNORMAL HIGH (ref 13.0–17.0)
POTASSIUM: 5 meq/L (ref 3.7–5.3)
SODIUM: 148 meq/L — AB (ref 137–147)
TCO2: 26 mmol/L (ref 0–100)

## 2014-02-06 LAB — I-STAT TROPONIN, ED: TROPONIN I, POC: 0.12 ng/mL — AB (ref 0.00–0.08)

## 2014-02-06 LAB — SODIUM: SODIUM: 150 meq/L — AB (ref 137–147)

## 2014-02-06 LAB — MRSA PCR SCREENING: MRSA by PCR: NEGATIVE

## 2014-02-06 LAB — ETHANOL

## 2014-02-06 LAB — URINE MICROSCOPIC-ADD ON

## 2014-02-06 LAB — APTT: APTT: 25 s (ref 24–37)

## 2014-02-06 LAB — LACTIC ACID, PLASMA: Lactic Acid, Venous: 2.9 mmol/L — ABNORMAL HIGH (ref 0.5–2.2)

## 2014-02-06 MED ORDER — ROCURONIUM BROMIDE 50 MG/5ML IV SOLN
INTRAVENOUS | Status: AC
  Filled 2014-02-06: qty 2

## 2014-02-06 MED ORDER — SODIUM CHLORIDE 0.9 % IV SOLN
250.0000 mL | INTRAVENOUS | Status: DC | PRN
Start: 2014-02-06 — End: 2014-02-13
  Administered 2014-02-12: 250 mL via INTRAVENOUS

## 2014-02-06 MED ORDER — FENTANYL CITRATE 0.05 MG/ML IJ SOLN: 100.0000 ug | INTRAMUSCULAR | Status: DC | PRN

## 2014-02-06 MED ORDER — BIOTENE DRY MOUTH MT LIQD
15.0000 mL | Freq: Four times a day (QID) | OROMUCOSAL | Status: DC
  Administered 2014-02-07 – 2014-02-14 (×30): 15 mL via OROMUCOSAL

## 2014-02-06 MED ORDER — HEPARIN SODIUM (PORCINE) 5000 UNIT/ML IJ SOLN
5000.0000 [IU] | Freq: Three times a day (TID) | INTRAMUSCULAR | Status: DC
  Administered 2014-02-06 – 2014-02-07 (×2): 5000 [IU] via SUBCUTANEOUS
  Filled 2014-02-06 (×5): qty 1

## 2014-02-06 MED ORDER — LIDOCAINE HCL (CARDIAC) 20 MG/ML IV SOLN
INTRAVENOUS | Status: AC
  Filled 2014-02-06: qty 5

## 2014-02-06 MED ORDER — FENTANYL CITRATE 0.05 MG/ML IJ SOLN
100.0000 ug | INTRAMUSCULAR | Status: DC | PRN
  Administered 2014-02-06 – 2014-02-08 (×2): 100 ug via INTRAVENOUS
  Filled 2014-02-06 (×2): qty 2

## 2014-02-06 MED ORDER — CHLORHEXIDINE GLUCONATE 0.12 % MT SOLN
15.0000 mL | Freq: Two times a day (BID) | OROMUCOSAL | Status: DC
  Administered 2014-02-06 – 2014-02-13 (×14): 15 mL via OROMUCOSAL
  Filled 2014-02-06 (×15): qty 15

## 2014-02-06 MED ORDER — ACETAMINOPHEN 160 MG/5ML PO SOLN
650.0000 mg | Freq: Four times a day (QID) | ORAL | Status: DC | PRN
Start: 2014-02-06 — End: 2014-02-13

## 2014-02-06 MED ORDER — SUCCINYLCHOLINE CHLORIDE 20 MG/ML IJ SOLN
INTRAMUSCULAR | Status: AC
  Filled 2014-02-06: qty 1

## 2014-02-06 MED ORDER — PROPOFOL 10 MG/ML IV EMUL
5.0000 ug/kg/min | INTRAVENOUS | Status: DC
Start: 2014-02-06 — End: 2014-02-06
  Administered 2014-02-06: 20 ug/kg/min via INTRAVENOUS

## 2014-02-06 MED ORDER — PROPOFOL 10 MG/ML IV EMUL
5.0000 ug/kg/min | INTRAVENOUS | Status: DC
  Administered 2014-02-06 (×3): 45 ug/kg/min via INTRAVENOUS
  Administered 2014-02-06: 30 ug/kg/min via INTRAVENOUS
  Administered 2014-02-07 (×2): 40 ug/kg/min via INTRAVENOUS
  Administered 2014-02-07 (×2): 45 ug/kg/min via INTRAVENOUS
  Administered 2014-02-08 (×2): 40 ug/kg/min via INTRAVENOUS
  Administered 2014-02-08 (×2): 30 ug/kg/min via INTRAVENOUS
  Administered 2014-02-09: 15 ug/kg/min via INTRAVENOUS
  Administered 2014-02-09 (×2): 30 ug/kg/min via INTRAVENOUS
  Administered 2014-02-10: 15 ug/kg/min via INTRAVENOUS
  Administered 2014-02-10: 20 ug/kg/min via INTRAVENOUS
  Administered 2014-02-11: 15 ug/kg/min via INTRAVENOUS
  Administered 2014-02-11: 25 ug/kg/min via INTRAVENOUS
  Filled 2014-02-06 (×15): qty 100

## 2014-02-06 MED ORDER — SODIUM CHLORIDE 3 % IV SOLN
INTRAVENOUS | Status: DC
  Administered 2014-02-06 – 2014-02-07 (×2): via INTRAVENOUS
  Filled 2014-02-06 (×5): qty 500

## 2014-02-06 MED ORDER — VITAL HIGH PROTEIN PO LIQD
1000.0000 mL | ORAL | Status: DC
  Administered 2014-02-06: 1000 mL
  Administered 2014-02-07 (×2)
  Filled 2014-02-06 (×2): qty 1000

## 2014-02-06 MED ORDER — SUCCINYLCHOLINE CHLORIDE 20 MG/ML IJ SOLN
125.0000 mg | Freq: Once | INTRAMUSCULAR | Status: AC
  Administered 2014-02-06: 125 mg via INTRAVENOUS
  Filled 2014-02-06: qty 6.25

## 2014-02-06 MED ORDER — ETOMIDATE 2 MG/ML IV SOLN
20.0000 mg | Freq: Once | INTRAVENOUS | Status: AC
  Administered 2014-02-06: 20 mg via INTRAVENOUS

## 2014-02-06 MED ORDER — PANTOPRAZOLE SODIUM 40 MG IV SOLR
40.0000 mg | INTRAVENOUS | Status: DC
  Administered 2014-02-06: 40 mg via INTRAVENOUS
  Filled 2014-02-06 (×2): qty 40

## 2014-02-06 MED ORDER — PROPOFOL 10 MG/ML IV EMUL
INTRAVENOUS | Status: AC
  Administered 2014-02-06: 45 ug/kg/min via INTRAVENOUS
  Filled 2014-02-06: qty 100

## 2014-02-06 MED ORDER — ETOMIDATE 2 MG/ML IV SOLN
INTRAVENOUS | Status: AC
  Filled 2014-02-06: qty 20

## 2014-02-06 NOTE — Progress Notes (Signed)
Chaplain responded to ED request for family support of unresponsive patient. Escorted family to bedside, present during 14 update. Provided emotional support. Escorted family to Consultation room B. Please page if needed.   Ethelene Browns [phone]

## 2014-02-06 NOTE — Progress Notes (Signed)
Pt placed on 100% Fi02 & transported to ICU from ER. Pt remained stable throughout & no complications noted.

## 2014-02-06 NOTE — Consult Note (Addendum)
Referring Physician: Jeanell Sparrow    Chief Complaint: Unresponsive  HPI: William Collier is an 63 y.o. male who was last in contact with friends at about 1930 yesterday.  Friends were unable to contact him today and went to his home where they found him on the floor unresponsive.  EMS was called at that time and the patient was brought to the ED.  Patient continued to be unresponsive and required intubation.  Patient currently intubated and on Diprovan.   Patient has had a stroke in the past but reportedly lives alone and ambulates with a cane.  Drags the left foot and is unable to use the left arm.    Date last known well: Date: 02/05/2014 Time last known well: Time: 19:30 tPA Given: No: Outside treatment window  Past Medical History  Diagnosis Date  . Hypertension 08-20-11  . Dysrhythmia 08-20-11    skip beat, hx. RBBB  . GERD (gastroesophageal reflux disease) 08-20-11    controls with diet, hx. colon polyps(benign)  . Cancer 08-20-11    bladder cancer  . Arthritis   . Stroke 08-20-11    '08-Lt. sided weakness with residual > lt. arm.  . Chronic kidney disease     CKD stage III  . Unspecified hereditary and idiopathic peripheral neuropathy   . Other specified cardiac dysrhythmias(427.89)   . Unspecified late effects of cerebrovascular disease   . Spasm of muscle   . Other abnormal blood chemistry   . Muscle weakness (generalized)   . Unspecified vitamin D deficiency   . Other chronic pain   . Chronic kidney disease, stage III (moderate)   . Complications affecting other specified body systems, hypertension   . Routine general medical examination at a health care facility   . Screening for diabetes mellitus   . Malignant neoplasm of bladder, part unspecified   . Reflux esophagitis   . Unspecified constipation     Past Surgical History  Procedure Laterality Date  . Bladder surgery  08-20-11    07-01-11 bladder surgery, past hx. lithotripsy  . Shoulder arthrotomy  08-20-11    left  shoulder with retained pins  . Knee arthroscopy  08-20-11    bil. knee scopes-torn ligament  . Cystoscopy w/ ureteral stent placement  08/21/2011    Procedure: CYSTOSCOPY WITH RETROGRADE PYELOGRAM/URETERAL STENT PLACEMENT;  Surgeon: Molli Hazard, MD;  Location: [facility];  Service: Urology;  Laterality: Left;  . Cystoscopy with biopsy  08/21/2011    Procedure: CYSTOSCOPY WITH BIOPSY;  Surgeon: Molli Hazard, MD;  Location: [facility];  Service: Urology;  Laterality: N/A;  . Transurethral resection of bladder tumor  08/21/2011    Procedure: TRANSURETHRAL RESECTION OF BLADDER TUMOR (TURBT);  Surgeon: Molli Hazard, MD;  Location: [facility];  Service: Urology;  Laterality: N/A;  need PK gyrus  . Cystectomy  10/07/2011    Procedure: CYSTECTOMY COMPLETE;  Surgeon: Molli Hazard, MD;  Location: [facility];  Service: Urology;  Laterality: Bilateral;  Radical Cystoprostatectomy Bilateral Pelvic Lymph Node Dissection Formation Of Ileal Conduit   . Prostatectomy  10/07/2011    Procedure: PROSTATECTOMY;  Surgeon: Molli Hazard, MD;  Location: [facility];  Service: Urology;  Laterality: N/A;  . Lymph node dissection  10/07/2011    Procedure: LYMPH NODE DISSECTION;  Surgeon: Molli Hazard, MD;  Location: [facility];  Service: Urology;  Laterality: Bilateral;    Family History  Problem Relation Age of Onset  . Diabetes Mother   . Hypertension Mother  Social History:  reports that he quit smoking about 2 years ago. His smoking use included Cigarettes. He has a 40 pack-year smoking history. He does not have any smokeless tobacco history on file. He reports that he uses illicit drugs (Marijuana) about 3 times per week. He reports that he does not drink alcohol.  Allergies:  Allergies  Allergen Reactions  . Penicillins Other (See Comments)    HAD WHEN WAS A CHILD   . Pregabalin Swelling    Medications: I have reviewed the patient's current medications. Prior to Admission:   Current outpatient prescriptions: amLODipine (NORVASC) 10 MG tablet, TAKE 1 TABLET BY MOUTH ONCE DAILY FOR BLOOD PRESSURE, Disp: 30 tablet, Rfl: 5;   aspirin 325 MG tablet, Take 1 tablet (325 mg total) by mouth daily., Disp: 90 tablet, Rfl: 3;   metoprolol tartrate (LOPRESSOR) 25 MG tablet, TAKE 2 TABLETS BY MOUTH IN THE MORNING AND 1 TABLET BY MOUTH AT NIGHT, Disp: 90 tablet, Rfl: 3 Multiple Vitamins-Minerals (DAILY MULTI) TABS, Take by mouth. Take one tablet once a day, Disp: , Rfl: ;   omeprazole (PRILOSEC) 20 MG capsule, TAKE 1 CAPSULE BY MOUTH EVERY DAY, Disp: 30 capsule, Rfl: 5;   OxyCODONE HCl, Abuse Deter, 5 MG TABA, Take 5 mg by mouth 2 (two) times daily. As needed for pain, Disp: 60 tablet, Rfl: 0 pravastatin (PRAVACHOL) 40 MG tablet, TAKE 1 TABLET BY MOUTH EVERY NIGHT AT BEDTIME FOR CHOLESTEROL, Disp: 30 tablet, Rfl: 5;  [DISCONTINUED]  cloNIDine (CATAPRES) 0.1 MG tablet, Take 0.1 mg by mouth 2 (two) times daily. , Disp: , Rfl: ;  [DISCONTINUED]  doxazosin (CARDURA) 4 MG tablet, Take 4 mg by mouth at bedtime. , Disp: , Rfl:  enoxaparin (LOVENOX) 40 MG/0.4ML SOLN, Inject 0.4 mLs (40 mg total) into the skin daily., Disp: 11.2 mL, Rfl: 3  ROS: Unable to obtain due to patient condition  Physical Examination: Blood pressure 163/107, pulse 58, resp. rate 23, height 5\' 8"  (1.727 m), weight 80 kg (176 lb 5.9 oz), SpO2 100.00%.  Neurologic Examination: (Intubated on Diprovan) Mental Status: Patient does not respond to verbal stimuli.  Localizes to deep sternal rub with both upper extremities (right greater than left).  Does not follow commands.  No verbalizations noted.  Cranial Nerves: II: patient does not respond confrontation bilaterally, pupils right 3 mm, left 3 mm,and reactive bilaterally III,IV,VI: doll's response absent bilaterally.  V,VII: corneal reflex reduced bilaterally  VIII: patient does not respond to verbal stimuli IX,X: gag reflex reduced, XI: trapezius strength unable  to test bilaterally XII: tongue strength unable to test Motor: Moves both upper extremities (right greater than left), withdrawal response noted in the lower extremities Sensory: Respond to noxious stimuli throughout. Deep Tendon Reflexes:  2+ throughout. Plantars: upgoing bilaterally Cerebellar: Unable to perform    Laboratory Studies:  Basic Metabolic Panel:  Recent Labs Lab 02/06/14 1456 02/06/14 1527  NA 146 148*  K 5.4* 5.0  CL 109 113*  CO2 19  --   GLUCOSE 162* 157*  BUN 27* 42*  CREATININE 1.66* 1.80*  CALCIUM 9.8  --     Liver Function Tests:  Recent Labs Lab 02/06/14 1456  AST 169*  ALT 54*  ALKPHOS 89  BILITOT 1.2  PROT 8.4*  ALBUMIN 4.0   No results found for this basename: LIPASE, AMYLASE,  in the last 168 hours No results found for this basename: AMMONIA,  in the last 168 hours  CBC:  Recent Labs Lab 02/06/14 1456 02/06/14  1527  WBC 20.6*  --   NEUTROABS 17.0*  --   HGB 15.8 17.3*  HCT 45.7 51.0  MCV 90.5  --   PLT 278  --     Cardiac Enzymes: No results found for this basename: CKTOTAL, CKMB, CKMBINDEX, TROPONINI,  in the last 168 hours  BNP: No components found with this basename: POCBNP,   CBG:  Recent Labs Lab 02/06/14 [place]*    Microbiology: Results for orders placed during the hospital encounter of 11/18/11  URINE CULTURE     Status: None   Collection Time    11/19/11  1:04 AM      Result Value Ref Range Status   Specimen Description URINE, SUPRAPUBIC   Final   Special Requests NONE   Final   Culture  Setup Time [id number]   Final   Colony Count >=100,000 COLONIES/ML   Final   Culture ENTEROBACTER AEROGENES   Final   Report Status 11/20/2011 FINAL   Final   Organism ID, Bacteria ENTEROBACTER AEROGENES   Final    Coagulation Studies:  Recent Labs  02/06/14 1456  LABPROT 15.3*  INR 1.24    Urinalysis: No results found for this basename: COLORURINE, APPERANCEUR, LABSPEC, PHURINE, GLUCOSEU,  HGBUR, BILIRUBINUR, KETONESUR, PROTEINUR, UROBILINOGEN, NITRITE, LEUKOCYTESUR,  in the last 168 hours  Lipid Panel:    Component Value Date/Time   CHOL 122 02/08/2010 2316   TRIG 71 12/22/2013 1201   HDL 58 12/22/2013 1201   HDL 47 02/08/2010 2316   CHOLHDL 2.9 12/22/2013 1201   CHOLHDL 2.6 Ratio 02/08/2010 2316   VLDL 15 02/08/2010 2316   LDLCALC 99 12/22/2013 1201   LDLCALC 60 02/08/2010 2316    HgbA1C:  No results found for this basename: HGBA1C    Urine Drug Screen:   No results found for this basename: labopia,  cocainscrnur,  labbenz,  amphetmu,  thcu,  labbarb    Alcohol Level:   Recent Labs Lab 02/06/14 1456  ETH <11    Other results: EKG: sinus tachycardia at 129 bpm.  Imaging: Ct Head Wo Contrast  02/06/2014   CLINICAL DATA:  Found unresponsive. Last normal last night at 1930 hr. Hypertension. Bladder cancer. Prior stroke 08/20/2011 and 2008 with left-sided weakness. Chronic kidney disease.  EXAM: CT HEAD WITHOUT CONTRAST  CT CERVICAL SPINE WITHOUT CONTRAST  TECHNIQUE: Multidetector CT imaging of the head and cervical spine was performed following the standard protocol without intravenous contrast. Multiplanar CT image reconstructions of the cervical spine were also generated.  COMPARISON:  Brain MR of 04/13/2006 and CT of 04/13/2006.  FINDINGS: CT HEAD FINDINGS  Sinuses/Soft tissues: Left frontoparietal scalp soft tissue swelling. Left maxillary sinus mucosal thickening. Ethmoid air cell and sphenoid sinus mucosal thickening. No skull fracture. Clear mastoid air cells.  Intracranial: Remote right MCA infarct with resultant ex vacuo dilatation of the right lateral ventricle.  Interval cortically based left MCA distribution infarct, likely acute to subacute. 5 mm of left-to-right midline shift. Basal cisterns patent. No complicating hemorrhage, intra-axial, or extra-axial fluid collection  CT CERVICAL SPINE FINDINGS  The lateral view images through the bottom of T1. Prevertebral soft  tissues not well evaluated, secondary to endotracheal tube in place. Fluid in the sphenoid sinus and oral pharynx, is likely secondary to endotracheal tube. Dense left carotid atherosclerosis. No apical pneumothorax.  Spondylosis with areas of central canal and neural foraminal narrowing. Most advanced at C3-4 and C4-5.  Skull base intact. Maintenance of vertebral body height. Straightening  of expected lordosis. Facets are well-aligned.  IMPRESSION: 1. Spondylosis, without acute fracture or subluxation in the cervical spine. Straightening of expected cervical lordosis could be positional, due to muscular spasm, or ligamentous injury. 2. Interval left posterior MCA distribution cortically based infarct, likely acute to subacute. Resultant mild mass effect with 5 mm of left-to-right midline shift. Critical test results telephoned to Dr. Jeanell Sparrow. at the time of interpretation at . 419 p.m.on . 02/06/2014. 3. Remote right MCA distribution infarct. 4. Sinus disease. 5. Left sided scalp soft tissue swelling.   Electronically Signed   By: Abigail Miyamoto M.D.   On: 02/06/2014 16:20   Ct Cervical Spine Wo Contrast  02/06/2014   CLINICAL DATA:  Found unresponsive. Last normal last night at 1930 hr. Hypertension. Bladder cancer. Prior stroke 08/20/2011 and 2008 with left-sided weakness. Chronic kidney disease.  EXAM: CT HEAD WITHOUT CONTRAST  CT CERVICAL SPINE WITHOUT CONTRAST  TECHNIQUE: Multidetector CT imaging of the head and cervical spine was performed following the standard protocol without intravenous contrast. Multiplanar CT image reconstructions of the cervical spine were also generated.  COMPARISON:  Brain MR of 04/13/2006 and CT of 04/13/2006.  FINDINGS: CT HEAD FINDINGS  Sinuses/Soft tissues: Left frontoparietal scalp soft tissue swelling. Left maxillary sinus mucosal thickening. Ethmoid air cell and sphenoid sinus mucosal thickening. No skull fracture. Clear mastoid air cells.  Intracranial: Remote right MCA infarct  with resultant ex vacuo dilatation of the right lateral ventricle.  Interval cortically based left MCA distribution infarct, likely acute to subacute. 5 mm of left-to-right midline shift. Basal cisterns patent. No complicating hemorrhage, intra-axial, or extra-axial fluid collection  CT CERVICAL SPINE FINDINGS  The lateral view images through the bottom of T1. Prevertebral soft tissues not well evaluated, secondary to endotracheal tube in place. Fluid in the sphenoid sinus and oral pharynx, is likely secondary to endotracheal tube. Dense left carotid atherosclerosis. No apical pneumothorax.  Spondylosis with areas of central canal and neural foraminal narrowing. Most advanced at C3-4 and C4-5.  Skull base intact. Maintenance of vertebral body height. Straightening of expected lordosis. Facets are well-aligned.  IMPRESSION: 1. Spondylosis, without acute fracture or subluxation in the cervical spine. Straightening of expected cervical lordosis could be positional, due to muscular spasm, or ligamentous injury. 2. Interval left posterior MCA distribution cortically based infarct, likely acute to subacute. Resultant mild mass effect with 5 mm of left-to-right midline shift. Critical test results telephoned to Dr. Jeanell Sparrow. at the time of interpretation at . 419 p.m.on . 02/06/2014. 3. Remote right MCA distribution infarct. 4. Sinus disease. 5. Left sided scalp soft tissue swelling.   Electronically Signed   By: Abigail Miyamoto M.D.   On: 02/06/2014 16:20   Dg Chest Portable 1 View  02/06/2014   CLINICAL DATA:  Endotracheal tube placement and altered mental status.  EXAM: PORTABLE CHEST - 1 VIEW  COMPARISON:  02/15/2013  FINDINGS: Endotracheal tube terminates 4.6 cm above carina. Nasogastric tube terminates at the proximal stomach.  Midline trachea. Mild cardiomegaly. No pleural effusion or pneumothorax. Clear lungs.  IMPRESSION: Appropriate position of endotracheal tube.  Cardiomegaly without congestive failure.    Electronically Signed   By: Abigail Miyamoto M.D.   On: 02/06/2014 15:23    Assessment: 63 y.o. male presenting unresponsive.  Head CT reviewed and shows a large left MCA distribution infarct that appears to be acute to subacute.  There is 61mm midline shift.  There is also evidence of a chronic right MCA infarct.  Patient on antiplatelet  therapy at home.  Unclear if he is compliant.  Currently intubated.   Surprising patient able to move both upper extremities, particularly the right.  Midline shift already noted on imaging.  Suspect this will further worsen.    Stroke Risk Factors - hypertension  Plan: 1. HgbA1c, fasting lipid panel 2. MRI, MRA  of the brain without contrast 3. PT consult, OT consult, Speech consult once exutbated 4. Echocardiogram 5. Carotid dopplers 6. Prophylactic therapy-Antiplatelet med: Aspirin - dose 300mg  daily by rectal administration 7. Permissive BP management 8. Telemetry monitoring 9. Frequent neuro checks 10. Hypertonic saline protocol 11. Blood sugar control  This patient is critically ill and at significant risk of neurological worsening, death and care requires constant monitoring of vital signs, hemodynamics,respiratory and cardiac monitoring, neurological assessment, discussion with family, other specialists and medical decision making of high complexity. I spent 60 minutes of neurocritical care time  in the care of  this patient.    Alexis Goodell, MD [facility] [phone] 02/06/2014, 5:15 PM

## 2014-02-06 NOTE — Procedures (Signed)
Central Venous Catheter Insertion Procedure Note FRANKI STEMEN [id number] 1951/07/27  Procedure: Insertion of Central Venous Catheter Indications: Assessment of intravascular volume, Drug and/or fluid administration and Frequent blood sampling  Procedure Details Consent: Risks of procedure as well as the alternatives and risks of each were explained to the (patient/caregiver).  Consent for procedure obtained. Time Out: Verified patient identification, verified procedure, site/side was marked, verified correct patient position, special equipment/implants available, medications/allergies/relevent history reviewed, required imaging and test results available.  Performed  Maximum sterile technique was used including antiseptics, cap, gloves, gown, hand hygiene, mask and sheet. Skin prep: Chlorhexidine; local anesthetic administered A antimicrobial bonded/coated triple lumen catheter was placed in the right internal jugular vein using the Seldinger technique. Procedure done under direct visualization with ultrasound.   Evaluation Blood flow good Complications: No apparent complications Patient did tolerate procedure well. Chest X-ray ordered to verify placement.  CXR: normal.  Carin Hock 02/06/2014, 9:34 PM

## 2014-02-06 NOTE — ED Notes (Signed)
Family members at bedside and dr Doy Mince here to eval pt and speak with family

## 2014-02-06 NOTE — H&P (Signed)
PULMONARY / CRITICAL CARE MEDICINE   Name: William Collier MRN: [id number] DOB: 1951-07-27    ADMISSION DATE:  02/06/2014 CONSULTATION DATE:  02/06/2014  REFERRING MD :  Doy Mince Neuro Hospitalist PRIMARY SERVICE: PCCM  CHIEF COMPLAINT:  Found down  BRIEF PATIENT DESCRIPTION: 63 y/o male with a past medical history of stroke was brought to the emergency department at [facility] on 02/06/2014 after he had been found unresponsive. He was found to have evidence of a subacute left posterior MCA distribution stroke.  SIGNIFICANT EVENTS / STUDIES:  June 7 CT c-spine> spondylosis June 7 CT head> interval left posterior MCA distribution acute to subacute infarct, 5 mm left-to-right midline shift; remote R MCA stroke  LINES / TUBES: 02/06/2014 endotracheal tube>   CULTURES: 02/06/2014 blood culture>   ANTIBIOTICS:   HISTORY OF PRESENT ILLNESS:  63 y/o male with a past medical history of stroke was brought to the emergency department at [facility] on 02/06/2014 after he had been found unresponsive. He was found to have evidence of a subacute left posterior MCA distribution stroke. No further history could be obtained because the patient was intubated prior to my arrival. Per chart review the patient was last seen normal on 02/05/2014. He was found down on 02/06/2014. A friend called 911 and he was brought to the emergency room via EMS.   PAST MEDICAL HISTORY :  Past Medical History  Diagnosis Date  . Hypertension 08-20-11  . Dysrhythmia 08-20-11    skip beat, hx. RBBB  . GERD (gastroesophageal reflux disease) 08-20-11    controls with diet, hx. colon polyps(benign)  . Cancer 08-20-11    bladder cancer  . Arthritis   . Stroke 08-20-11    '08-Lt. sided weakness with residual > lt. arm.  . Chronic kidney disease     CKD stage III  . Unspecified hereditary and idiopathic peripheral neuropathy   . Other specified cardiac dysrhythmias(427.89)   . Unspecified late effects of  cerebrovascular disease   . Spasm of muscle   . Other abnormal blood chemistry   . Muscle weakness (generalized)   . Unspecified vitamin D deficiency   . Other chronic pain   . Chronic kidney disease, stage III (moderate)   . Complications affecting other specified body systems, hypertension   . Routine general medical examination at a health care facility   . Screening for diabetes mellitus   . Malignant neoplasm of bladder, part unspecified   . Reflux esophagitis   . Unspecified constipation    Past Surgical History  Procedure Laterality Date  . Bladder surgery  08-20-11    07-01-11 bladder surgery, past hx. lithotripsy  . Shoulder arthrotomy  08-20-11    left shoulder with retained pins  . Knee arthroscopy  08-20-11    bil. knee scopes-torn ligament  . Cystoscopy w/ ureteral stent placement  08/21/2011    Procedure: CYSTOSCOPY WITH RETROGRADE PYELOGRAM/URETERAL STENT PLACEMENT;  Surgeon: Molli Hazard, MD;  Location: [facility];  Service: Urology;  Laterality: Left;  . Cystoscopy with biopsy  08/21/2011    Procedure: CYSTOSCOPY WITH BIOPSY;  Surgeon: Molli Hazard, MD;  Location: [facility];  Service: Urology;  Laterality: N/A;  . Transurethral resection of bladder tumor  08/21/2011    Procedure: TRANSURETHRAL RESECTION OF BLADDER TUMOR (TURBT);  Surgeon: Molli Hazard, MD;  Location: [facility];  Service: Urology;  Laterality: N/A;  need PK gyrus  . Cystectomy  10/07/2011    Procedure: CYSTECTOMY COMPLETE;  Surgeon: Dennard Schaumann  Jasmine December, MD;  Location: [facility];  Service: Urology;  Laterality: Bilateral;  Radical Cystoprostatectomy Bilateral Pelvic Lymph Node Dissection Formation Of Ileal Conduit   . Prostatectomy  10/07/2011    Procedure: PROSTATECTOMY;  Surgeon: Molli Hazard, MD;  Location: [facility];  Service: Urology;  Laterality: N/A;  . Lymph node dissection  10/07/2011    Procedure: LYMPH NODE DISSECTION;  Surgeon: Molli Hazard, MD;  Location: [facility];   Service: Urology;  Laterality: Bilateral;   Prior to Admission medications   Medication Sig Start Date End Date Taking? Authorizing Provider  amLODipine (NORVASC) 10 MG tablet TAKE 1 TABLET BY MOUTH ONCE DAILY FOR BLOOD PRESSURE 12/23/13   Blanchie Serve, MD  aspirin 325 MG tablet Take 1 tablet (325 mg total) by mouth daily. 08/20/13   Tiffany L Reed, DO  metoprolol tartrate (LOPRESSOR) 25 MG tablet TAKE 2 TABLETS BY MOUTH IN THE MORNING AND 1 TABLET BY MOUTH AT NIGHT 12/23/13   Blanchie Serve, MD  Multiple Vitamins-Minerals (DAILY MULTI) TABS Take by mouth. Take one tablet once a day    Historical Provider, MD  omeprazole (PRILOSEC) 20 MG capsule TAKE 1 CAPSULE BY MOUTH EVERY DAY 12/23/13   Blanchie Serve, MD  OxyCODONE HCl, Abuse Deter, 5 MG TABA Take 5 mg by mouth 2 (two) times daily. As needed for pain 01/20/14   Tiffany L Reed, DO  pravastatin (PRAVACHOL) 40 MG tablet TAKE 1 TABLET BY MOUTH EVERY NIGHT AT BEDTIME FOR CHOLESTEROL 12/23/13   Blanchie Serve, MD   Allergies  Allergen Reactions  . Penicillins Other (See Comments)    HAD WHEN WAS A CHILD   . Pregabalin Swelling    FAMILY HISTORY/SOCIAL HISTORY/REVIEW OF SYSTEMS:  Cannot obtain due to intubation  SUBJECTIVE:   VITAL SIGNS: Pulse Rate:  [58-113] 58 (06/07 1640) Resp:  [23-28] 23 (06/07 1640) BP: (160-177)/(102-119) 163/107 mmHg (06/07 1630) SpO2:  [100 %] 100 % (06/07 1640) FiO2 (%):  [100 %] 100 % (06/07 1640) Weight:  [80 kg (176 lb 5.9 oz)] 80 kg (176 lb 5.9 oz) (06/07 1500) HEMODYNAMICS:   VENTILATOR SETTINGS: Vent Mode:  [-] PRVC FiO2 (%):  [100 %] 100 % Set Rate:  [16 bmp] 16 bmp Vt Set:  [540 mL] 540 mL PEEP:  [5 cmH20] 5 cmH20 Plateau Pressure:  [14 cmH20-15 cmH20] 15 cmH20 INTAKE / OUTPUT: Intake/Output   None     PHYSICAL EXAMINATION: Gen: sedated on vent, no acute distress HEENT: NCAT, PERRL, ETT in place PULM: rhonchi bilaterally CV: RRR, no mgr, no JVD AB: BS+, soft, nontender, no hsm Ext: warm, no  edema, no clubbing, no cyanosis Derm: large bruise left shin noted, otherwise no breakdown Neuro: sedated on vent, spontaneously reaches for tube with R hand   LABS:  CBC  Recent Labs Lab 02/06/14 1456 02/06/14 1527  WBC 20.6*  --   HGB 15.8 17.3*  HCT 45.7 51.0  PLT 278  --    Coag's  Recent Labs Lab 02/06/14 1456  APTT 25  INR 1.24   BMET  Recent Labs Lab 02/06/14 1456 02/06/14 1527  NA 146 148*  K 5.4* 5.0  CL 109 113*  CO2 19  --   BUN 27* 42*  CREATININE 1.66* 1.80*  GLUCOSE 162* 157*   Electrolytes  Recent Labs Lab 02/06/14 1456  CALCIUM 9.8   Sepsis Markers No results found for this basename: LATICACIDVEN, PROCALCITON, O2SATVEN,  in the last 168 hours ABG  Recent Labs Lab 02/06/14 1613  PHART 7.296*  PCO2ART 50.3*  PO2ART 32.0*   Liver Enzymes  Recent Labs Lab 02/06/14 1456  AST 169*  ALT 54*  ALKPHOS 89  BILITOT 1.2  ALBUMIN 4.0   Cardiac Enzymes No results found for this basename: TROPONINI, PROBNP,  in the last 168 hours Glucose  Recent Labs Lab 02/06/14 1506  GLUCAP 144*    Imaging  CXR: Cardiomegally, ETT 4.5 cm above carina  ASSESSMENT / PLAN:   NEUROLOGIC A:  Acute encephalopathy > most likely due to acute stroke Acute L posterior MCA stroke P:   -Propofol, prn fentanyl for vent synchony, titrated to RASS goal: -1 -stroke care per neurology   PULMONARY A:Acute respiratory failure due to inability to protect airway P:   -full vent support -daily SBT/WUA -daily CXR  CARDIOVASCULAR A: Hypertension in setting of acute stroke P:  -hold  RENAL A:  CKD at baseline Mild hyperkalemia > spontaneously improving Hypernatremia> likely from being found down  P:   -hold off on correcting Na to avoid causing brain edema  GASTROINTESTINAL A:  No acute issues P:   -OG tube  -Pantoprazole for stress ulcer prophylaxis  HEMATOLOGIC A:  No acute issue P:  -monitor for bleeding  INFECTIOUS A:   Leukocytosis but no sign of infection P:   -monitor fever curve  ENDOCRINE A:  No acute issues P:   -monitor glucose  -acute stroke glucose goal 140-180  Code: Full  TODAY'S SUMMARY: Acute encephalopathy due to CVA, VDRF  I have personally obtained a history, examined the patient, evaluated laboratory and imaging results, formulated the assessment and plan and placed orders. CRITICAL CARE: The patient is critically ill with multiple organ systems failure and requires high complexity decision making for assessment and support, frequent evaluation and titration of therapies, application of advanced monitoring technologies and extensive interpretation of multiple databases. Critical Care Time devoted to patient care services described in this note is 40 minutes.    Roselie Awkward, MD [facility] Pager: [phone] Cell: [phone] If no response, call [phone]   02/06/2014, 5:09 PM

## 2014-02-06 NOTE — ED Provider Notes (Signed)
CSN: [id number]     Arrival date & time 02/06/14  1433 History   First MD Initiated Contact with Patient 02/06/14 1443     Chief Complaint  Patient presents with  . Altered Mental Status     (Consider location/radiation/quality/duration/timing/severity/associated sxs/prior Treatment) HPI Level 5 caveat altered mental status Patient brought in via EMS with reports that he was found with altered mental status. Reportedly last seen normal yesterday. Friends were unable to get hold of them and today he was found in his apartment on the floor. He was unresponsive. His blood sugar was checked prehospital and he received Narcan without any change in his mental status. Patient was placed on a backboard and neck and head immobilized for transport. There was some blood noted around the face with some subconjunctival hemorrhage and there was some concern for trauma. EMS also reports that there was a pipe in the room.  Past Medical History  Diagnosis Date  . Hypertension 08-20-11  . Dysrhythmia 08-20-11    skip beat, hx. RBBB  . GERD (gastroesophageal reflux disease) 08-20-11    controls with diet, hx. colon polyps(benign)  . Cancer 08-20-11    bladder cancer  . Arthritis   . Stroke 08-20-11    '08-Lt. sided weakness with residual > lt. arm.  . Chronic kidney disease     CKD stage III  . Unspecified hereditary and idiopathic peripheral neuropathy   . Other specified cardiac dysrhythmias(427.89)   . Unspecified late effects of cerebrovascular disease   . Spasm of muscle   . Other abnormal blood chemistry   . Muscle weakness (generalized)   . Unspecified vitamin D deficiency   . Other chronic pain   . Chronic kidney disease, stage III (moderate)   . Complications affecting other specified body systems, hypertension   . Routine general medical examination at a health care facility   . Screening for diabetes mellitus   . Malignant neoplasm of bladder, part unspecified   . Reflux esophagitis    . Unspecified constipation    Past Surgical History  Procedure Laterality Date  . Bladder surgery  08-20-11    07-01-11 bladder surgery, past hx. lithotripsy  . Shoulder arthrotomy  08-20-11    left shoulder with retained pins  . Knee arthroscopy  08-20-11    bil. knee scopes-torn ligament  . Cystoscopy w/ ureteral stent placement  08/21/2011    Procedure: CYSTOSCOPY WITH RETROGRADE PYELOGRAM/URETERAL STENT PLACEMENT;  Surgeon: Molli Hazard, MD;  Location: [facility];  Service: Urology;  Laterality: Left;  . Cystoscopy with biopsy  08/21/2011    Procedure: CYSTOSCOPY WITH BIOPSY;  Surgeon: Molli Hazard, MD;  Location: [facility];  Service: Urology;  Laterality: N/A;  . Transurethral resection of bladder tumor  08/21/2011    Procedure: TRANSURETHRAL RESECTION OF BLADDER TUMOR (TURBT);  Surgeon: Molli Hazard, MD;  Location: [facility];  Service: Urology;  Laterality: N/A;  need PK gyrus  . Cystectomy  10/07/2011    Procedure: CYSTECTOMY COMPLETE;  Surgeon: Molli Hazard, MD;  Location: [facility];  Service: Urology;  Laterality: Bilateral;  Radical Cystoprostatectomy Bilateral Pelvic Lymph Node Dissection Formation Of Ileal Conduit   . Prostatectomy  10/07/2011    Procedure: PROSTATECTOMY;  Surgeon: Molli Hazard, MD;  Location: [facility];  Service: Urology;  Laterality: N/A;  . Lymph node dissection  10/07/2011    Procedure: LYMPH NODE DISSECTION;  Surgeon: Molli Hazard, MD;  Location: [facility];  Service: Urology;  Laterality: Bilateral;  Family History  Problem Relation Age of Onset  . Diabetes Mother   . Hypertension Mother    History  Substance Use Topics  . Smoking status: Former Smoker -- 1.00 packs/day for 40 years    Types: Cigarettes    Quit date: 10/28/2011  . Smokeless tobacco: Not on file  . Alcohol Use: No    Review of Systems  Unable to perform ROS     Allergies  Penicillins and Pregabalin  Home Medications   Prior to Admission  medications   Medication Sig Start Date End Date Taking? Authorizing Provider  amLODipine (NORVASC) 10 MG tablet TAKE 1 TABLET BY MOUTH ONCE DAILY FOR BLOOD PRESSURE 12/23/13   Blanchie Serve, MD  aspirin 325 MG tablet Take 1 tablet (325 mg total) by mouth daily. 08/20/13   Tiffany L Reed, DO  metoprolol tartrate (LOPRESSOR) 25 MG tablet TAKE 2 TABLETS BY MOUTH IN THE MORNING AND 1 TABLET BY MOUTH AT NIGHT 12/23/13   Blanchie Serve, MD  Multiple Vitamins-Minerals (DAILY MULTI) TABS Take by mouth. Take one tablet once a day    Historical Provider, MD  omeprazole (PRILOSEC) 20 MG capsule TAKE 1 CAPSULE BY MOUTH EVERY DAY 12/23/13   Blanchie Serve, MD  OxyCODONE HCl, Abuse Deter, 5 MG TABA Take 5 mg by mouth 2 (two) times daily. As needed for pain 01/20/14   Tiffany L Reed, DO  pravastatin (PRAVACHOL) 40 MG tablet TAKE 1 TABLET BY MOUTH EVERY NIGHT AT BEDTIME FOR CHOLESTEROL 12/23/13   Mahima Pandey, MD   BP 177/119  Pulse 113  Resp 28  Ht 5\' 8"  (1.727 m)  Wt 176 lb 5.9 oz (80 kg)  BMI 26.82 kg/m2  SpO2 100% Physical Exam  Nursing note and vitals reviewed. Constitutional: He appears well-developed and well-nourished.  HENT:  Head: Normocephalic and atraumatic.  Right Ear: External ear normal.  Left Ear: External ear normal.  Nose: Nose normal.  Mouth/Throat: Oropharynx is clear and moist.  Eyes:  Pupils are constricted and not reactive. There is bilateral subconjunctival hemorrhage  Cardiovascular: Tachycardia present.   Pulmonary/Chest: Breath sounds normal. No respiratory distress.  Sonorous respirations  Abdominal: Bowel sounds are normal.  Musculoskeletal:  No signs of trauma or edema  Neurological:  Patient is unresponsive to painful stimuli he appears to have some decerebrate posturing Patellar reflexes are 1 bilaterally  Skin: Skin is warm and dry.    ED Course  INTUBATION Date/Time: 02/06/2014 4:16 PM Performed by: Shaune Pollack Authorized by: Shaune Pollack Consent given  by: patient Patient identity confirmed: hospital-assigned identification number Time out: Immediately prior to procedure a "time out" was called to verify the correct patient, procedure, equipment, support staff and site/side marked as required. Indications: airway protection Intubation method: fiberoptic oral Patient status: paralyzed (RSI) Preoxygenation: nonrebreather mask Sedatives: etomidate Paralytic: succinylcholine Tube size: 8.0 mm Tube type: cuffed Number of attempts: 1 Cricoid pressure: yes Cords visualized: yes Post-procedure assessment: chest rise,  ETCO2 monitor and CO2 detector Breath sounds: equal Cuff inflated: yes ETT to lip: 25 cm Tube secured with: ETT holder Chest x-Tarig Zimmers interpreted by me and radiologist. Chest x-Ludie Hudon findings: endotracheal tube in appropriate position Patient tolerance: Patient tolerated the procedure well with no immediate complications.   (including critical care time) Labs Review Labs Reviewed  DIFFERENTIAL - Abnormal; Notable for the following:    Neutrophils Relative % 82 (*)    Neutro Abs 17.0 (*)    Lymphocytes Relative 10 (*)    Monocytes Absolute  1.5 (*)    All other components within normal limits  CBC - Abnormal; Notable for the following:    WBC 20.6 (*)    All other components within normal limits  COMPREHENSIVE METABOLIC PANEL - Abnormal; Notable for the following:    Potassium 5.4 (*)    Glucose, Bld 162 (*)    BUN 27 (*)    Creatinine, Ser 1.66 (*)    Total Protein 8.4 (*)    AST 169 (*)    ALT 54 (*)    GFR calc non Af Amer 42 (*)    GFR calc Af Amer 49 (*)    All other components within normal limits  PROTIME-INR - Abnormal; Notable for the following:    Prothrombin Time 15.3 (*)    All other components within normal limits  CBG MONITORING, ED - Abnormal; Notable for the following:    Glucose-Capillary 144 (*)    All other components within normal limits  I-STAT CHEM 8, ED - Abnormal; Notable for the following:     Sodium 148 (*)    Chloride 113 (*)    BUN 42 (*)    Creatinine, Ser 1.80 (*)    Glucose, Bld 157 (*)    Hemoglobin 17.3 (*)    All other components within normal limits  I-STAT TROPOININ, ED - Abnormal; Notable for the following:    Troponin i, poc 0.12 (*)    All other components within normal limits  CULTURE, BLOOD (ROUTINE X 2)  CULTURE, BLOOD (ROUTINE X 2)  ETHANOL  APTT  URINE RAPID DRUG SCREEN (HOSP PERFORMED)  URINALYSIS, ROUTINE W REFLEX MICROSCOPIC  BLOOD GAS, ARTERIAL  LACTIC ACID, PLASMA  I-STAT TROPOININ, ED    Imaging Review Dg Chest Portable 1 View  02/06/2014   CLINICAL DATA:  Endotracheal tube placement and altered mental status.  EXAM: PORTABLE CHEST - 1 VIEW  COMPARISON:  02/15/2013  FINDINGS: Endotracheal tube terminates 4.6 cm above carina. Nasogastric tube terminates at the proximal stomach.  Midline trachea. Mild cardiomegaly. No pleural effusion or pneumothorax. Clear lungs.  IMPRESSION: Appropriate position of endotracheal tube.  Cardiomegaly without congestive failure.   Electronically Signed   By: Abigail Miyamoto M.D.   On: 02/06/2014 15:23     EKG Interpretation None      MDM   Final diagnoses:  Stroke  Endotracheally intubated   63 year old male who presents with altered mental status and sonorous respirations with no gag reflex. Patient was intubated. CT scan is consistent with left MCA stroke. Last seen normal was yesterday. I have consult is critical care and neurology. Discussed with Dr. Jobe Igo who read head ct   CRITICAL CARE Performed by: Shaune Pollack Total critical care time: 45 Critical care time was exclusive of separately billable procedures and treating other patients. Critical care was necessary to treat or prevent imminent or life-threatening deterioration. Critical care was time spent personally by me on the following activities: development of treatment plan with patient and/or surrogate as well as nursing, discussions with  consultants, evaluation of patient's response to treatment, examination of patient, obtaining history from patient or surrogate, ordering and performing treatments and interventions, ordering and review of laboratory studies, ordering and review of radiographic studies, pulse oximetry and re-evaluation of patient's condition.   Shaune Pollack, MD 02/06/14 [phone]

## 2014-02-06 NOTE — ED Notes (Signed)
Critical care at bedside to eval pt

## 2014-02-06 NOTE — ED Notes (Signed)
NOTIFIED DR. Stevie Kern FOR PATIENTS LAB RESULTS OF I-STAT TROPONIN ,02/06/2014.

## 2014-02-06 NOTE — ED Notes (Signed)
Pt arrived by [facility]. A friend found pt unresponsive on floor today, lsn yesterday 1930.

## 2014-02-07 ENCOUNTER — Inpatient Hospital Stay (HOSPITAL_COMMUNITY): Payer: Medicare PPO

## 2014-02-07 LAB — CBC
HCT: 36.8 % — ABNORMAL LOW (ref 39.0–52.0)
Hemoglobin: 12.3 g/dL — ABNORMAL LOW (ref 13.0–17.0)
MCH: 30.1 pg (ref 26.0–34.0)
MCHC: 33.4 g/dL (ref 30.0–36.0)
MCV: 90.2 fL (ref 78.0–100.0)
PLATELETS: 203 10*3/uL (ref 150–400)
RBC: 4.08 MIL/uL — ABNORMAL LOW (ref 4.22–5.81)
RDW: 15.1 % (ref 11.5–15.5)
WBC: 13.6 10*3/uL — ABNORMAL HIGH (ref 4.0–10.5)

## 2014-02-07 LAB — BASIC METABOLIC PANEL
BUN: 26 mg/dL — ABNORMAL HIGH (ref 6–23)
CALCIUM: 8.9 mg/dL (ref 8.4–10.5)
CO2: 24 mEq/L (ref 19–32)
Chloride: 119 mEq/L — ABNORMAL HIGH (ref 96–112)
Creatinine, Ser: 1.76 mg/dL — ABNORMAL HIGH (ref 0.50–1.35)
GFR, EST AFRICAN AMERICAN: 46 mL/min — AB (ref >90)
GFR, EST NON AFRICAN AMERICAN: 39 mL/min — AB (ref >90)
Glucose, Bld: 123 mg/dL — ABNORMAL HIGH (ref 70–99)
POTASSIUM: 3.9 meq/L (ref 3.7–5.3)
SODIUM: 154 meq/L — AB (ref 137–147)

## 2014-02-07 LAB — GLUCOSE, CAPILLARY
GLUCOSE-CAPILLARY: 116 mg/dL — AB (ref 70–99)
Glucose-Capillary: 105 mg/dL — ABNORMAL HIGH (ref 70–99)
Glucose-Capillary: 109 mg/dL — ABNORMAL HIGH (ref 70–99)
Glucose-Capillary: 126 mg/dL — ABNORMAL HIGH (ref 70–99)
Glucose-Capillary: 130 mg/dL — ABNORMAL HIGH (ref 70–99)
Glucose-Capillary: 140 mg/dL — ABNORMAL HIGH (ref 70–99)
Glucose-Capillary: 142 mg/dL — ABNORMAL HIGH (ref 70–99)

## 2014-02-07 LAB — SODIUM: SODIUM: 151 meq/L — AB (ref 137–147)

## 2014-02-07 MED ORDER — ENOXAPARIN SODIUM 40 MG/0.4ML ~~LOC~~ SOLN
40.0000 mg | SUBCUTANEOUS | Status: DC
  Administered 2014-02-07 – 2014-02-12 (×6): 40 mg via SUBCUTANEOUS
  Filled 2014-02-07 (×7): qty 0.4

## 2014-02-07 MED ORDER — VITAL HIGH PROTEIN PO LIQD
1000.0000 mL | ORAL | Status: DC
  Administered 2014-02-07: 1000 mL
  Administered 2014-02-07: 15:00:00
  Administered 2014-02-08 – 2014-02-09 (×2): 1000 mL
  Administered 2014-02-09
  Administered 2014-02-10: 1000 mL
  Filled 2014-02-07 (×9): qty 1000

## 2014-02-07 MED ORDER — HYDRALAZINE HCL 20 MG/ML IJ SOLN
10.0000 mg | INTRAMUSCULAR | Status: DC | PRN
  Administered 2014-02-09 – 2014-02-10 (×2): 20 mg via INTRAVENOUS
  Administered 2014-02-10: 10 mg via INTRAVENOUS
  Filled 2014-02-07 (×3): qty 1

## 2014-02-07 MED ORDER — PROPOFOL 10 MG/ML IV EMUL
INTRAVENOUS | Status: AC
  Filled 2014-02-07: qty 100

## 2014-02-07 MED ORDER — METOPROLOL TARTRATE 25 MG PO TABS
25.0000 mg | ORAL_TABLET | Freq: Two times a day (BID) | ORAL | Status: DC
  Administered 2014-02-07 – 2014-02-11 (×8): 25 mg
  Filled 2014-02-07 (×10): qty 1

## 2014-02-07 MED ORDER — ASPIRIN 300 MG RE SUPP
300.0000 mg | Freq: Every day | RECTAL | Status: DC
  Administered 2014-02-07 – 2014-02-13 (×7): 300 mg via RECTAL
  Filled 2014-02-07 (×7): qty 1

## 2014-02-07 MED ORDER — AMLODIPINE BESYLATE 10 MG PO TABS
10.0000 mg | ORAL_TABLET | Freq: Every day | ORAL | Status: DC
  Administered 2014-02-07 – 2014-02-11 (×5): 10 mg
  Filled 2014-02-07 (×7): qty 1

## 2014-02-07 MED ORDER — PANTOPRAZOLE SODIUM 40 MG PO PACK
40.0000 mg | PACK | Freq: Every day | ORAL | Status: DC
  Administered 2014-02-07 – 2014-02-10 (×4): 40 mg
  Filled 2014-02-07 (×6): qty 20

## 2014-02-07 NOTE — Progress Notes (Signed)
Stroke Team Progress Note  HISTORY William Collier is an 63 y.o. male who was last in contact with friends at about 1930 yesterday 02/05/2014. Friends were unable to contact him today 02/06/2014 and went to his home where they found him on the floor unresponsive. EMS was called at that time and the patient was brought to the ED. Patient continued to be unresponsive and required intubation. Patient currently intubated and on Diprovan. Patient has had a stroke in the past but reportedly lives alone and ambulates with a cane. Drags the left foot and is unable to use the left arm.  Patient was not administered TPA secondary to delay in arrival. He was admitted to the neuro ICU for further evaluation and treatment.  SUBJECTIVE His friends are at the bedside.    OBJECTIVE Most recent Vital Signs: Filed Vitals:   02/07/14 0600 02/07/14 0630 02/07/14 0700 02/07/14 0800  BP: 141/88 132/84 134/86 159/111  Pulse: 61 60 57   Temp:      TempSrc:      Resp: 14 14 14    Height:      Weight:      SpO2: 100%  100% 100%   CBG (last 3)   Recent Labs  02/07/14 0014 02/07/14 0325 02/07/14 0801  GLUCAP 116* 105* 126*    IV Fluid Intake:   . propofol Stopped (02/07/14 0715)  . sodium chloride (hypertonic) 75 mL/hr at 02/07/14 0800    MEDICATIONS  . antiseptic oral rinse  15 mL Mouth Rinse QID  . chlorhexidine  15 mL Mouth Rinse BID  . feeding supplement (VITAL HIGH PROTEIN)  1,000 mL Per Tube Q24H  . heparin  5,000 Units Subcutaneous 3 times per day  . pantoprazole (PROTONIX) IV  40 mg Intravenous Q24H   PRN:  sodium chloride, acetaminophen (TYLENOL) oral liquid 160 mg/5 mL, fentaNYL, fentaNYL, hydrALAZINE  Diet:  NPO  Activity:  Bedrest DVT Prophylaxis:  Heparin 5000 units sq tid   CLINICALLY SIGNIFICANT STUDIES Basic Metabolic Panel:  Recent Labs Lab 02/06/14 1456 02/06/14 1527  02/07/14 0145 02/07/14 0520  NA 146 148*  < > 151* 154*  K 5.4* 5.0  --   --  3.9  CL 109 113*  --   --   119*  CO2 19  --   --   --  24  GLUCOSE 162* 157*  --   --  123*  BUN 27* 42*  --   --  26*  CREATININE 1.66* 1.80*  --   --  1.76*  CALCIUM 9.8  --   --   --  8.9  < > = values in this interval not displayed. Liver Function Tests:   Recent Labs Lab 02/06/14 1456  AST 169*  ALT 54*  ALKPHOS 89  BILITOT 1.2  PROT 8.4*  ALBUMIN 4.0   CBC:   Recent Labs Lab 02/06/14 1456 02/06/14 1527 02/07/14 0520  WBC 20.6*  --  13.6*  NEUTROABS 17.0*  --   --   HGB 15.8 17.3* 12.3*  HCT 45.7 51.0 36.8*  MCV 90.5  --  90.2  PLT 278  --  203   Coagulation:   Recent Labs Lab 02/06/14 1456  LABPROT 15.3*  INR 1.24   Cardiac Enzymes: No results found for this basename: CKTOTAL, CKMB, CKMBINDEX, TROPONINI,  in the last 168 hours Urinalysis:   Recent Labs Lab 02/06/14 1900  COLORURINE YELLOW  LABSPEC 1.013  PHURINE 6.5  GLUCOSEU NEGATIVE  HGBUR LARGE*  BILIRUBINUR NEGATIVE  KETONESUR NEGATIVE  PROTEINUR 30*  UROBILINOGEN 0.2  NITRITE NEGATIVE  LEUKOCYTESUR TRACE*   Lipid Panel    Component Value Date/Time   CHOL 122 02/08/2010 2316   TRIG 71 12/22/2013 1201   HDL 58 12/22/2013 1201   HDL 47 02/08/2010 2316   CHOLHDL 2.9 12/22/2013 1201   CHOLHDL 2.6 Ratio 02/08/2010 2316   VLDL 15 02/08/2010 2316   LDLCALC 99 12/22/2013 1201   LDLCALC 60 02/08/2010 2316   HgbA1C  No results found for this basename: HGBA1C    Urine Drug Screen:     Component Value Date/Time   LABOPIA NONE DETECTED 02/06/2014 1900   COCAINSCRNUR NONE DETECTED 02/06/2014 1900   LABBENZ NONE DETECTED 02/06/2014 1900   AMPHETMU NONE DETECTED 02/06/2014 1900   THCU POSITIVE* 02/06/2014 1900   LABBARB NONE DETECTED 02/06/2014 1900    Alcohol Level:   Recent Labs Lab 02/06/14 1456  ETH <11     CT Cervical Spine 02/06/2014    1. Spondylosis, without acute fracture or subluxation in the cervical spine. Straightening of expected cervical lordosis could be positional, due to muscular spasm, or ligamentous injury.     CT of the brain  02/06/2014   Interval left posterior MCA distribution cortically based infarct, likely acute to subacute. Resultant mild mass effect with 5 mm of left-to-right midline shift.   MRI of the brain    MRA of the brain    2D Echocardiogram    Carotid Doppler    CXR   02/06/2014   Tube and catheter positions as described without pneumothorax. Prominence of the aortic arch is probably indicative of chronic hypertensive change. There is mild atelectatic change in the left base. Elsewhere lungs are clear.    02/06/2014   Appropriate position of endotracheal tube.  Cardiomegaly without congestive failure.     EKG  sinus tachycardia, RBBB, PAC's noted, inferior infarct, old. For complete results please see formal report.   Therapy Recommendations   Physical Exam   Frail middle aged african [place] male intubated.off propofol drip. . Afebrile. Head is nontraumatic. Neck is supple without bruit.  . Cardiac exam no murmur or gallop. Lungs are clear to auscultation. Distal pulses are well felt. Neurological Exam : Lethargic. Partially opens eyes to sternal rub. Left gaze deviation. Will not cross midline even with reflex eye movements. Pupils 2 mm small sluggishly reactive. Does not blink to threat bilaterally. Right lower facial weakness. Tongue midline. Purposeful antigravity movements on the right side. Moves left side purposefully. Right plantar equivocal left downgoing. ASSESSMENT William Collier is a 63 y.o. male found down unresponsive, required intubation. Imaging confirms a left MCA infarct in the setting of old right brain infarct and cerebral edema with 52mm left-to-right shift.  Placed on 3% protocol. Infarct felt to be embolic secondary to unknown source.  On aspirin 325 mg orally every day prior to admission. Now on no antithrombotics for secondary stroke prevention. Patient intubated, unresponsive, left gaze preference, pupils non-reactive. Stroke work up underway.  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  F/u HgbA1c  Turn off 3%. Do not adjust other fluids. Monitor to ensure it is decreasing over time. Do not plan to resume at this time  MRI, MRA brain today prior to consideration of extubation TEE to look for embolic source. Arranged with [place] for tomorrow.  If positive for PFO (patent foramen ovale), check bilateral lower extremity venous dopplers to rule out DVT as possible source of stroke. (I have made patient NPO after midnight tonight. DO NOT EXTUBATE prior to TEE. Will do TEE in ICU room tomorrow).  Resume statin once able to swallow  SIGNED Burnetta Sabin, MSN, RN, ANVP-BC, ANP-BC, GNP-BC [facility] Pager: [phone] 02/07/2014 2:18 PM  I have personally obtained a history, examined the patient, evaluated imaging results, and formulated the assessment and plan of care. I agree with the above. Antony Contras, MD   To contact Stroke Continuity provider, please refer to [email or web address] After hours, contact General Neurology

## 2014-02-07 NOTE — Consult Note (Signed)
WOC ostomy consult note Stoma type/location: RLQ, ileal conduit Stomal assessment/size: 1"x 1 1/8" oval, budded stoma Peristomal assessment: some mild maceration, may be related to current pouching device. Skin not broken Treatment options for stomal/peristomal skin: none Output clear, yellow urine. Strong odor Ostomy pouching: 1pc.convex urostomy pouch with 2" barrier ring and hooked to BSD.  Education provided: explained to staff use of spigot on urostomy pouch, attachment to the BSD and possible use of leg strap if needed to keep pouch from twisting and not draining well.  Pt intubated and sedated, no family in the room at the time of my visit.  Extra supplies ordered for staff use. Plan to change again on Thursday   WOC will follow along with you for support with ostomy care. Bernise Sylvain Charlotte RN,CWOCN [phone]

## 2014-02-07 NOTE — Progress Notes (Signed)
PULMONARY / CRITICAL CARE MEDICINE   Name: William Collier MRN: [id number] DOB: 11-Mar-1951    ADMISSION DATE:  02/06/2014 CONSULTATION DATE:  02/06/2014  REFERRING MD :  Doy Mince Neuro Hospitalist PRIMARY SERVICE: PCCM  CHIEF COMPLAINT:  Found down  BRIEF PATIENT DESCRIPTION: 63 y/o male with a past medical history of stroke was brought to the emergency department at [facility] on 02/06/2014 after he had been found unresponsive. He was found to have evidence of a subacute left posterior MCA distribution stroke. PMH - Htn, CKD  SIGNIFICANT EVENTS / STUDIES:  June 7 CT c-spine> spondylosis June 7 CT head> interval left posterior MCA distribution acute to subacute infarct, 5 mm left-to-right midline shift; remote R MCA stroke  LINES / TUBES: 02/06/2014 ETtube>   CULTURES: 02/06/2014 blood culture>   ANTIBIOTICS:   SUBJECTIVE: Intubated, sedated with propofol Bp high on WUA afebrile  VITAL SIGNS: Temp:  [98.2 F (36.8 C)-100.2 F (37.9 C)] 98.4 F (36.9 C) (06/08 0800) Pulse Rate:  [52-113] 52 (06/08 1000) Resp:  [13-28] 19 (06/08 0900) BP: (105-184)/(70-119) 174/70 mmHg (06/08 1000) SpO2:  [100 %] 100 % (06/08 1000) FiO2 (%):  [40 %-100 %] 40 % (06/08 0830) Weight:  [77.4 kg (170 lb 10.2 oz)-80 kg (176 lb 5.9 oz)] 79 kg (174 lb 2.6 oz) (06/08 0400) HEMODYNAMICS:   VENTILATOR SETTINGS: Vent Mode:  [-] PSV;CPAP FiO2 (%):  [40 %-100 %] 40 % Set Rate:  [14 bmp-16 bmp] 14 bmp Vt Set:  [540 mL] 540 mL PEEP:  [5 cmH20] 5 cmH20 Pressure Support:  [5 cmH20] 5 cmH20 Plateau Pressure:  [14 cmH20-15 cmH20] 15 cmH20 INTAKE / OUTPUT: Intake/Output     06/07 0701 - 06/08 0700 06/08 0701 - 06/09 0700   I.V. (mL/kg) 1996.7 (25.3) 180 (2.3)   NG/GT 270 120   Total Intake(mL/kg) 2266.7 (28.7) 300 (3.8)   Urine (mL/kg/hr) 1000 450 (1.5)   Emesis/NG output 150    Total Output 1150 450   Net +1116.7 -150          PHYSICAL EXAMINATION: Gen: sedated on vent, no acute  distress HEENT: NCAT, PERRL, ETT in place PULM: rhonchi bilaterally CV: RRR, no mgr, no JVD AB: BS+, soft, nontender, no hsm Ext: warm, no edema, no clubbing, no cyanosis Derm: large bruise left shin noted, otherwise no breakdown Neuro: sedated on vent, spontaneously reaches for tube with R hand   LABS:  CBC  Recent Labs Lab 02/06/14 1456 02/06/14 1527 02/07/14 0520  WBC 20.6*  --  13.6*  HGB 15.8 17.3* 12.3*  HCT 45.7 51.0 36.8*  PLT 278  --  203   Coag's  Recent Labs Lab 02/06/14 1456  APTT 25  INR 1.24   BMET  Recent Labs Lab 02/06/14 1456 02/06/14 1527 02/06/14 1934 02/07/14 0145 02/07/14 0520  NA 146 148* 150* 151* 154*  K 5.4* 5.0  --   --  3.9  CL 109 113*  --   --  119*  CO2 19  --   --   --  24  BUN 27* 42*  --   --  26*  CREATININE 1.66* 1.80*  --   --  1.76*  GLUCOSE 162* 157*  --   --  123*   Electrolytes  Recent Labs Lab 02/06/14 1456 02/07/14 0520  CALCIUM 9.8 8.9   Sepsis Markers  Recent Labs Lab 02/06/14 1726  LATICACIDVEN 2.9*   ABG  Recent Labs Lab 02/06/14 1613 02/06/14 1709  PHART  7.296* 7.376  PCO2ART 50.3* 40.9  PO2ART 32.0* 526.0*   Liver Enzymes  Recent Labs Lab 02/06/14 1456  AST 169*  ALT 54*  ALKPHOS 89  BILITOT 1.2  ALBUMIN 4.0   Cardiac Enzymes No results found for this basename: TROPONINI, PROBNP,  in the last 168 hours Glucose  Recent Labs Lab 02/06/14 1506 02/06/14 2020 02/07/14 0014 02/07/14 0325 02/07/14 0801  GLUCAP 144* 102* 116* 105* 126*    Imaging  CXR: Cardiomegally, ETT 4.5 cm above carina  ASSESSMENT / PLAN:   NEUROLOGIC A:  Acute encephalopathy > most likely due to acute stroke Acute L posterior MCA stroke P:   -Propofol, prn fentanyl for vent synchony, titrate to RASS goal: 0 -stroke care per neurology, MRI planned -3% saline   PULMONARY A:Acute respiratory failure due to inability to protect airway P:   -full vent support -daily SBT/WUA -daily  CXR  CARDIOVASCULAR A: Hypertension in setting of acute stroke P:  -resume metoprolol & amlodipin -hydralazine prn  RENAL A:  CKD at baseline Mild hyperkalemia > spontaneously improving Hypernatremia> likely from being found down  P:   -hold off on correcting Na to avoid causing brain edema  GASTROINTESTINAL A:  No acute issues P:   -ct TFs -Pantoprazole for stress ulcer prophylaxis  HEMATOLOGIC A:  No acute issue P:  -monitor for bleeding  INFECTIOUS A:  Leukocytosis but no sign of infection P:   -monitor fever curve  ENDOCRINE A:  No acute issues P:   -monitor glucose  -acute stroke glucose goal 140-180  Code: Full  TODAY'S SUMMARY: Acute encephalopathy due to CVA-await MRI prior to trial of extubation  I have personally obtained a history, examined the patient, evaluated laboratory and imaging results, formulated the assessment and plan and placed orders. CRITICAL CARE: The patient is critically ill with multiple organ systems failure and requires high complexity decision making for assessment and support, frequent evaluation and titration of therapies, application of advanced monitoring technologies and extensive interpretation of multiple databases. Critical Care Time devoted to patient care services described in this note is 35 minutes.    Kara Mead MD. [place] Flood. [facility] Pager [phone] If no response call [phone]     02/07/2014, 10:44 AM

## 2014-02-07 NOTE — Progress Notes (Addendum)
INITIAL NUTRITION ASSESSMENT  DOCUMENTATION CODES Per approved criteria  -Not Applicable   INTERVENTION: Increase Vital High Protein to 60 ml/hr.   Tube feeding regimen provides 1440 kcal, 126 grams of protein, and 1203 ml of H2O.   Current Propofol rate and goal TF provides 1820 total kcal (99% of needs).  NUTRITION DIAGNOSIS: Inadequate oral intake related to inability to eat as evidenced by NPO status  Goal: Pt to meet >/= 90% of their estimated nutrition needs   Monitor:  TF tolerance, respiratory status, weight trend, labs  Reason for Assessment: Consult received to initiate and manage enteral nutrition support.  63 y.o. male  Admitting Dx: Acute ischemic stroke  ASSESSMENT: Pt found unresponsive and admitted with subacute left posterior MCA stroke, hx of remote right MCA stroke.  Patient is currently intubated on ventilator support MV: 10.6 L/min Temp (24hrs), Avg:99.1 F (37.3 C), Min:98.2 F (36.8 C), Max:100.2 F (37.9 C)  Propofol: 14.4 ml/hr. Providing: 380 kcal/day from lipid  Vital High Protein @ 40 ml/hr providing: 960 kcal, 84 grams protein, and 802 ml H2O.   Sodium elevated on 3%. Family at bedside and report that pt lives alone. He cooks, uses the [employer], people bring him food, etc. Per family pt has not had any recent weight loss. He did lose a lot of weight a few years ago after his first stroke but has maintained his current weight.  Nutrition-focused physical exam showed no evidence of muscle or fat depletion.   Height: Ht Readings from Last 1 Encounters:  02/06/14 5\' 8"  (1.727 m)    Weight: Wt Readings from Last 1 Encounters:  02/07/14 174 lb 2.6 oz (79 kg)    Ideal Body Weight: 70 kg   % Ideal Body Weight: 113%  Wt Readings from Last 10 Encounters:  02/07/14 174 lb 2.6 oz (79 kg)  12/22/13 177 lb 3.2 oz (80.377 kg)  09/22/13 174 lb (78.926 kg)  06/16/13 167 lb 9.6 oz (76.023 kg)  03/03/13 173 lb 6.4 oz (78.654 kg)  12/22/12  175 lb (79.379 kg)  12/09/11 149 lb 14.6 oz (68 kg)  11/21/11 161 lb 2.5 oz (73.1 kg)  10/09/11 176 lb 2.4 oz (79.9 kg)  10/09/11 176 lb 2.4 oz (79.9 kg)    Usual Body Weight: 167-177 lb  % Usual Body Weight: within range  BMI:  Body mass index is 26.49 kg/(m^2).  Estimated Nutritional Needs: Kcal: 1846 Protein: >118 grams Fluid: >1.9 L/day  Skin: intact  Diet Order: NPO  EDUCATION NEEDS: -No education needs identified at this time   Intake/Output Summary (Last 24 hours) at 02/07/14 1108 Last data filed at 02/07/14 1000  Gross per 24 hour  Intake 2566.7 ml  Output   1600 ml  Net  966.7 ml  Pt with urostomy  Last BM: PTA   Labs:   Recent Labs Lab 02/06/14 1456 02/06/14 1527 02/06/14 1934 02/07/14 0145 02/07/14 0520  NA 146 148* 150* 151* 154*  K 5.4* 5.0  --   --  3.9  CL 109 113*  --   --  119*  CO2 19  --   --   --  24  BUN 27* 42*  --   --  26*  CREATININE 1.66* 1.80*  --   --  1.76*  CALCIUM 9.8  --   --   --  8.9  GLUCOSE 162* 157*  --   --  123*    CBG (last 3)   Recent Labs  02/07/14 0014 02/07/14 0325 02/07/14 0801  GLUCAP 116* 105* 126*   Scheduled Meds: . amLODipine  10 mg Per Tube Daily  . antiseptic oral rinse  15 mL Mouth Rinse QID  . chlorhexidine  15 mL Mouth Rinse BID  . feeding supplement (VITAL HIGH PROTEIN)  1,000 mL Per Tube Q24H  . heparin  5,000 Units Subcutaneous 3 times per day  . metoprolol tartrate  25 mg Per Tube BID  . pantoprazole sodium  40 mg Per Tube Q1200    Continuous Infusions: . propofol 30 mcg/kg/min (02/07/14 1000)    Past Medical History  Diagnosis Date  . Hypertension 08-20-11  . Dysrhythmia 08-20-11    skip beat, hx. RBBB  . GERD (gastroesophageal reflux disease) 08-20-11    controls with diet, hx. colon polyps(benign)  . Cancer 08-20-11    bladder cancer  . Arthritis   . Stroke 08-20-11    '08-Lt. sided weakness with residual > lt. arm.  . Chronic kidney disease     CKD stage III  .  Unspecified hereditary and idiopathic peripheral neuropathy   . Other specified cardiac dysrhythmias(427.89)   . Unspecified late effects of cerebrovascular disease   . Spasm of muscle   . Other abnormal blood chemistry   . Muscle weakness (generalized)   . Unspecified vitamin D deficiency   . Other chronic pain   . Chronic kidney disease, stage III (moderate)   . Complications affecting other specified body systems, hypertension   . Routine general medical examination at a health care facility   . Screening for diabetes mellitus   . Malignant neoplasm of bladder, part unspecified   . Reflux esophagitis   . Unspecified constipation     Past Surgical History  Procedure Laterality Date  . Bladder surgery  08-20-11    07-01-11 bladder surgery, past hx. lithotripsy  . Shoulder arthrotomy  08-20-11    left shoulder with retained pins  . Knee arthroscopy  08-20-11    bil. knee scopes-torn ligament  . Cystoscopy w/ ureteral stent placement  08/21/2011    Procedure: CYSTOSCOPY WITH RETROGRADE PYELOGRAM/URETERAL STENT PLACEMENT;  Surgeon: Molli Hazard, MD;  Location: [facility];  Service: Urology;  Laterality: Left;  . Cystoscopy with biopsy  08/21/2011    Procedure: CYSTOSCOPY WITH BIOPSY;  Surgeon: Molli Hazard, MD;  Location: [facility];  Service: Urology;  Laterality: N/A;  . Transurethral resection of bladder tumor  08/21/2011    Procedure: TRANSURETHRAL RESECTION OF BLADDER TUMOR (TURBT);  Surgeon: Molli Hazard, MD;  Location: [facility];  Service: Urology;  Laterality: N/A;  need PK gyrus  . Cystectomy  10/07/2011    Procedure: CYSTECTOMY COMPLETE;  Surgeon: Molli Hazard, MD;  Location: [facility];  Service: Urology;  Laterality: Bilateral;  Radical Cystoprostatectomy Bilateral Pelvic Lymph Node Dissection Formation Of Ileal Conduit   . Prostatectomy  10/07/2011    Procedure: PROSTATECTOMY;  Surgeon: Molli Hazard, MD;  Location: [facility];  Service: Urology;   Laterality: N/A;  . Lymph node dissection  10/07/2011    Procedure: LYMPH NODE DISSECTION;  Surgeon: Molli Hazard, MD;  Location: [facility];  Service: Urology;  Laterality: Bilateral;    Maylon Peppers RD, [place], [place] Pager [phone] After Hours Pager

## 2014-02-08 ENCOUNTER — Inpatient Hospital Stay (HOSPITAL_COMMUNITY): Payer: Medicare PPO

## 2014-02-08 DIAGNOSIS — N179 Acute kidney failure, unspecified: Secondary | ICD-10-CM

## 2014-02-08 DIAGNOSIS — I6789 Other cerebrovascular disease: Secondary | ICD-10-CM

## 2014-02-08 LAB — BASIC METABOLIC PANEL
BUN: 28 mg/dL — ABNORMAL HIGH (ref 6–23)
CO2: 24 mEq/L (ref 19–32)
CREATININE: 1.74 mg/dL — AB (ref 0.50–1.35)
Calcium: 9.1 mg/dL (ref 8.4–10.5)
Chloride: 112 mEq/L (ref 96–112)
GFR calc Af Amer: 46 mL/min — ABNORMAL LOW (ref >90)
GFR, EST NON AFRICAN AMERICAN: 40 mL/min — AB (ref >90)
Glucose, Bld: 104 mg/dL — ABNORMAL HIGH (ref 70–99)
POTASSIUM: 4 meq/L (ref 3.7–5.3)
Sodium: 152 mEq/L — ABNORMAL HIGH (ref 137–147)

## 2014-02-08 LAB — GLUCOSE, CAPILLARY
GLUCOSE-CAPILLARY: 104 mg/dL — AB (ref 70–99)
Glucose-Capillary: 104 mg/dL — ABNORMAL HIGH (ref 70–99)
Glucose-Capillary: 93 mg/dL (ref 70–99)
Glucose-Capillary: 93 mg/dL (ref 70–99)
Glucose-Capillary: 95 mg/dL (ref 70–99)

## 2014-02-08 LAB — CBC
HCT: 38.1 % — ABNORMAL LOW (ref 39.0–52.0)
HEMOGLOBIN: 12.7 g/dL — AB (ref 13.0–17.0)
MCH: 30.5 pg (ref 26.0–34.0)
MCHC: 33.3 g/dL (ref 30.0–36.0)
MCV: 91.4 fL (ref 78.0–100.0)
PLATELETS: 202 10*3/uL (ref 150–400)
RBC: 4.17 MIL/uL — ABNORMAL LOW (ref 4.22–5.81)
RDW: 15.3 % (ref 11.5–15.5)
WBC: 11.9 10*3/uL — ABNORMAL HIGH (ref 4.0–10.5)

## 2014-02-08 NOTE — Progress Notes (Signed)
PULMONARY / CRITICAL CARE MEDICINE   Name: William Collier MRN: [id number] DOB: Nov 13, 1950    ADMISSION DATE:  02/06/2014 CONSULTATION DATE:  02/06/2014  REFERRING MD :  Doy Mince Neuro Hospitalist PRIMARY SERVICE: PCCM  CHIEF COMPLAINT:  Found down  BRIEF PATIENT DESCRIPTION: 63 y/o male with a past medical history of stroke was brought to the emergency department at [facility] on 02/06/2014 after he had been found unresponsive. He was found to have evidence of a subacute left posterior MCA distribution stroke. PMH - Htn, CKD  SIGNIFICANT EVENTS / STUDIES:  June 7 CT c-spine> spondylosis June 7 CT head> interval left posterior MCA distribution acute to subacute infarct, 5 mm left-to-right midline shift; remote R MCA stroke 6/8 MRI - Large, acute left MCA territory infarct with 5 mm of rightward midline shift. Associated left MCA inferior division conclusion.  Remote, large right MCA territory infarct .Chronic pontine perforator infarct, new from prior MRI. Remote right basal ganglia infarct   LINES / TUBES: 02/06/2014 ETtube>   CULTURES: 02/06/2014 blood culture> ng  ANTIBIOTICS:   SUBJECTIVE: Intubated, sedated with propofol Bp better afebrile  VITAL SIGNS: Temp:  [97.5 F (36.4 C)-98.8 F (37.1 C)] 98.4 F (36.9 C) (06/09 0800) Pulse Rate:  [52-99] 68 (06/09 0813) Resp:  [11-22] 15 (06/09 0813) BP: (107-195)/(70-136) 130/89 mmHg (06/09 0813) SpO2:  [100 %] 100 % (06/09 0813) FiO2 (%):  [40 %] 40 % (06/09 0813) Weight:  [76.8 kg (169 lb 5 oz)] 76.8 kg (169 lb 5 oz) (06/09 0400) HEMODYNAMICS:   VENTILATOR SETTINGS: Vent Mode:  [-] PRVC FiO2 (%):  [40 %] 40 % Set Rate:  [14 bmp] 14 bmp Vt Set:  [520 mL] 520 mL PEEP:  [5 cmH20] 5 cmH20 Pressure Support:  [5 cmH20-8 cmH20] 8 cmH20 Plateau Pressure:  [14 cmH20-15 cmH20] 15 cmH20 INTAKE / OUTPUT: Intake/Output     06/08 0701 - 06/09 0700 06/09 0701 - 06/10 0700   I.V. (mL/kg) 769 (10) 48.8 (0.6)   NG/GT 834.7     Total Intake(mL/kg) 1603.7 (20.9) 48.8 (0.6)   Urine (mL/kg/hr) 1770 (1) 110 (0.7)   Emesis/NG output     Total Output 1770 110   Net -166.3 -61.2          PHYSICAL EXAMINATION: Gen: sedated on vent, no acute distress HEENT: NCAT, PERRL, ETT in place PULM: rhonchi bilaterally CV: RRR, no mgr, no JVD AB: BS+, soft, nontender, no hsm Ext: warm, no edema, no clubbing, no cyanosis Derm: large bruise left shin noted, otherwise no breakdown Neuro: sedated on vent, spontaneously reaches for tube with R hand   LABS:  CBC  Recent Labs Lab 02/06/14 1456 02/06/14 1527 02/07/14 0520 02/08/14 0400  WBC 20.6*  --  13.6* 11.9*  HGB 15.8 17.3* 12.3* 12.7*  HCT 45.7 51.0 36.8* 38.1*  PLT 278  --  203 202   Coag's  Recent Labs Lab 02/06/14 1456  APTT 25  INR 1.24   BMET  Recent Labs Lab 02/06/14 1456 02/06/14 1527  02/07/14 0145 02/07/14 0520 02/08/14 0400  NA 146 148*  < > 151* 154* 152*  K 5.4* 5.0  --   --  3.9 4.0  CL 109 113*  --   --  119* 112  CO2 19  --   --   --  24 24  BUN 27* 42*  --   --  26* 28*  CREATININE 1.66* 1.80*  --   --  1.76* 1.74*  GLUCOSE  162* 157*  --   --  123* 104*  < > = values in this interval not displayed. Electrolytes  Recent Labs Lab 02/06/14 1456 02/07/14 0520 02/08/14 0400  CALCIUM 9.8 8.9 9.1   Sepsis Markers  Recent Labs Lab 02/06/14 1726  LATICACIDVEN 2.9*   ABG  Recent Labs Lab 02/06/14 1613 02/06/14 1709  PHART 7.296* 7.376  PCO2ART 50.3* 40.9  PO2ART 32.0* 526.0*   Liver Enzymes  Recent Labs Lab 02/06/14 1456  AST 169*  ALT 54*  ALKPHOS 89  BILITOT 1.2  ALBUMIN 4.0   Cardiac Enzymes No results found for this basename: TROPONINI, PROBNP,  in the last 168 hours Glucose  Recent Labs Lab 02/07/14 1143 02/07/14 1627 02/07/14 2000 02/07/14 2330 02/08/14 0317 02/08/14 0800  GLUCAP 140* 109* 130* 142* 104* 93    Imaging  CXR: Cardiomegally, ETT 4.5 cm above carina  ASSESSMENT /  PLAN:   NEUROLOGIC A:  Acute encephalopathy > most likely due to acute stroke Acute L MCA stroke P:   -Propofol, prn fentanyl for vent synchony, titrate to RASS goal: 0 -stroke care per neurology -Off 3% saline   PULMONARY A:Acute respiratory failure due to inability to protect airway P:   --daily SBT/WUA -daily CXR  CARDIOVASCULAR A: Hypertension in setting of acute stroke P:  -resumed metoprolol & amlodipin -hydralazine prn  RENAL A:  CKD at baseline Mild hyperkalemia > spontaneously improving Hypernatremia> likely from being found down  P:   -hold off on correcting Na  GASTROINTESTINAL A:  No acute issues P:   -ct TFs -Pantoprazole for stress ulcer prophylaxis  HEMATOLOGIC A:  No acute issue P:  -monitor for bleeding  INFECTIOUS A:  Leukocytosis but no sign of infection P:   -monitor WBC  ENDOCRINE A:  No acute issues P:   -monitor glucose  -acute stroke glucose goal 140-180  Code: Full  TODAY'S SUMMARY: Acute encephalopathy due to CVA-await family discussion about goals of care prior to trial of extubation  I have personally obtained a history, examined the patient, evaluated laboratory and imaging results, formulated the assessment and plan and placed orders. CRITICAL CARE: The patient is critically ill with multiple organ systems failure and requires high complexity decision making for assessment and support, frequent evaluation and titration of therapies, application of advanced monitoring technologies and extensive interpretation of multiple databases. Critical Care Time devoted to patient care services described in this note is 35 minutes.    Kara Mead MD. [place] Flood. [facility] Pager [phone] If no response call [phone]     02/08/2014, 9:06 AM

## 2014-02-08 NOTE — Progress Notes (Signed)
*  PRELIMINARY RESULTS* Vascular Ultrasound Carotid Duplex (Doppler) has been completed.  Preliminary findings: Bilateral:  1-39% ICA stenosis.  Vertebral artery flow is antegrade.   Right side is poorly visualized due to IJ line and bilateral poorly visualized due to immobility.    Landry Mellow, RDMS, RVT  02/08/2014, 10:13 AM

## 2014-02-08 NOTE — Progress Notes (Signed)
Stroke Team Progress Note  HISTORY William Collier is an 63 y.o. male who was last in contact with friends at about 1930 yesterday 02/05/2014. Friends were unable to contact him today 02/06/2014 and went to his home where they found him on the floor unresponsive. EMS was called at that time and the patient was brought to the ED. Patient continued to be unresponsive and required intubation. Patient currently intubated and on Diprovan. Patient has had a stroke in the past but reportedly lives alone and ambulates with a cane. Drags the left foot and is unable to use the left arm. Patient was not administered TPA secondary to delay in arrival. He was admitted to the neuro ICU for further evaluation and treatment.  SUBJECTIVE No family present. Pt flailing right arm with propofol off this am.  OBJECTIVE Most recent Vital Signs: Filed Vitals:   02/08/14 0630 02/08/14 0700 02/08/14 0755 02/08/14 0813  BP: 130/89 111/83 121/86 130/89  Pulse: 57 60 69 68  Temp:      TempSrc:      Resp: 14 14 14 15   Height:      Weight:      SpO2:  100% 100% 100%   CBG (last 3)   Recent Labs  02/07/14 2330 02/08/14 0317 02/08/14 0800  GLUCAP 142* 104* 93    IV Fluid Intake:   . propofol 20 mcg/kg/min (02/08/14 0800)    MEDICATIONS  . amLODipine  10 mg Per Tube Daily  . antiseptic oral rinse  15 mL Mouth Rinse QID  . aspirin  300 mg Rectal Daily  . chlorhexidine  15 mL Mouth Rinse BID  . enoxaparin (LOVENOX) injection  40 mg Subcutaneous Q24H  . feeding supplement (VITAL HIGH PROTEIN)  1,000 mL Per Tube Q24H  . metoprolol tartrate  25 mg Per Tube BID  . pantoprazole sodium  40 mg Per Tube Q1200   PRN:  sodium chloride, acetaminophen (TYLENOL) oral liquid 160 mg/5 mL, fentaNYL, fentaNYL, hydrALAZINE  Diet:  NPO  Activity:  Bedrest DVT Prophylaxis:  Lovenox 40 mg sq daily   CLINICALLY SIGNIFICANT STUDIES Basic Metabolic Panel:   Recent Labs Lab 02/07/14 0520 02/08/14 0400  NA 154* 152*  K 3.9  4.0  CL 119* 112  CO2 24 24  GLUCOSE 123* 104*  BUN 26* 28*  CREATININE 1.76* 1.74*  CALCIUM 8.9 9.1   Liver Function Tests:   Recent Labs Lab 02/06/14 1456  AST 169*  ALT 54*  ALKPHOS 89  BILITOT 1.2  PROT 8.4*  ALBUMIN 4.0   CBC:   Recent Labs Lab 02/06/14 1456  02/07/14 0520 02/08/14 0400  WBC 20.6*  --  13.6* 11.9*  NEUTROABS 17.0*  --   --   --   HGB 15.8  < > 12.3* 12.7*  HCT 45.7  < > 36.8* 38.1*  MCV 90.5  --  90.2 91.4  PLT 278  --  203 202  < > = values in this interval not displayed. Coagulation:   Recent Labs Lab 02/06/14 1456  LABPROT 15.3*  INR 1.24   Cardiac Enzymes: No results found for this basename: CKTOTAL, CKMB, CKMBINDEX, TROPONINI,  in the last 168 hours Urinalysis:   Recent Labs Lab 02/06/14 1900  COLORURINE YELLOW  LABSPEC 1.013  PHURINE 6.5  GLUCOSEU NEGATIVE  HGBUR LARGE*  BILIRUBINUR NEGATIVE  KETONESUR NEGATIVE  PROTEINUR 30*  UROBILINOGEN 0.2  NITRITE NEGATIVE  LEUKOCYTESUR TRACE*   Lipid Panel    Component Value Date/Time  CHOL 122 02/08/2010 2316   TRIG 71 12/22/2013 1201   HDL 58 12/22/2013 1201   HDL 47 02/08/2010 2316   CHOLHDL 2.9 12/22/2013 1201   CHOLHDL 2.6 Ratio 02/08/2010 2316   VLDL 15 02/08/2010 2316   LDLCALC 99 12/22/2013 1201   LDLCALC 60 02/08/2010 2316   HgbA1C  No results found for this basename: HGBA1C    Urine Drug Screen:     Component Value Date/Time   LABOPIA NONE DETECTED 02/06/2014 1900   COCAINSCRNUR NONE DETECTED 02/06/2014 1900   LABBENZ NONE DETECTED 02/06/2014 1900   AMPHETMU NONE DETECTED 02/06/2014 1900   THCU POSITIVE* 02/06/2014 1900   LABBARB NONE DETECTED 02/06/2014 1900    Alcohol Level:   Recent Labs Lab 02/06/14 1456  ETH <11     CT Cervical Spine 02/06/2014    1. Spondylosis, without acute fracture or subluxation in the cervical spine. Straightening of expected cervical lordosis could be positional, due to muscular spasm, or ligamentous injury.    CT of the brain  02/06/2014    Interval left posterior MCA distribution cortically based infarct, likely acute to subacute. Resultant mild mass effect with 5 mm of left-to-right midline shift.   MRI/MRA of the brain  02/07/2014    1. Large, acute left MCA territory infarct with 5 mm of rightward midline shift. Associated left MCA inferior division conclusion. 2. Remote, large right MCA territory infarct with unchanged appearance of right M2 branch vessel occlusion. 3. Chronic pontine perforator infarct, new from prior MRI. Remote right basal ganglia infarct.   2D Echocardiogram    Carotid Doppler    TEE   CXR   02/07/2014    Stable atelectasis/ consolidation of the left lower lobe.    02/06/2014   Tube and catheter positions as described without pneumothorax. Prominence of the aortic arch is probably indicative of chronic hypertensive change. There is mild atelectatic change in the left base. Elsewhere lungs are clear.    02/06/2014   Appropriate position of endotracheal tube.  Cardiomegaly without congestive failure.     EKG  sinus tachycardia, RBBB, PAC's noted, inferior infarct, old. For complete results please see formal report.   Therapy Recommendations   Physical Exam   Frail middle aged african [place] male intubated.off propofol drip. . Afebrile. Head is nontraumatic. Neck is supple without bruit.  . Cardiac exam no murmur or gallop. Lungs are clear to auscultation. Distal pulses are well felt. Neurological Exam : Lethargic. Partially opens eyes to sternal rub. Left gaze deviation. Will not cross midline even with reflex eye movements. Pupils 2 mm small sluggishly reactive. Does not blink to threat bilaterally. Right lower facial weakness. Tongue midline. Purposeful antigravity movements on the right side. Moves left side purposefully. Right plantar equivocal left downgoing.  ASSESSMENT Mr. William Collier is a 63 y.o. male found down unresponsive, required intubation. Imaging confirms a left MCA infarct in the setting of  old right brain infarct and cerebral edema with 21mm left-to-right shift.  Placed on 3% protocol. Infarct felt to be embolic secondary to unknown source.  On aspirin 325 mg orally every day prior to admission. Now on aspirin 300 mg suppository for secondary stroke prevention. Patient intubated, unresponsive, left gaze preference, pupils non-reactive. Stroke work up underway.  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  Check HgbA1c, 2D, carotid doppler TEE to look for embolic source. Arranged with [place] for today in the room.  If positive for PFO (patent foramen ovale), check bilateral lower extremity venous dopplers to rule out DVT as possible source of stroke.  Resume statin once able to swallow if pt agreeable Extubation discussed over the phone with the daughter who states she has never discussed end of life with her dad - daughter said to do everything. Sister who arrived during phone conversation stated pt would not want aggressive care that would lead to SNF placement. Sister will speak with daughter and rest of family. Dr. Leonie Man available to sit down with them tomorrow. Will not extubate until plan of care established.  SIGNED Burnetta Sabin, MSN, RN, ANVP-BC, ANP-BC, GNP-BC [facility] Pager: [phone] 02/08/2014 8:54 AM This patient is critically ill and at significant risk of neurological worsening, death and care requires constant monitoring of vital signs, hemodynamics,respiratory and cardiac monitoring,review of multiple databases, neurological assessment, discussion with family, other specialists and medical decision making of high complexity. I spent 30 minutes of neurocritical care time  in the care of  this patient. I have personally obtained a history, examined the patient, evaluated imaging results, and formulated the assessment and plan of care. I agree with the above.  Antony Contras, MD   To contact Stroke Continuity provider, please refer to [email or web address] After hours, contact General Neurology

## 2014-02-08 NOTE — Progress Notes (Signed)
UR completed.  Dick Hark, RN BSN M[facility] CCM Trauma/Neuro ICU Case Manager [phone]  

## 2014-02-08 NOTE — Progress Notes (Signed)
  Echocardiogram Echocardiogram Transesophageal has been performed.  Rodriguez Camp 02/08/2014, 4:40 PM

## 2014-02-09 ENCOUNTER — Inpatient Hospital Stay (HOSPITAL_COMMUNITY): Payer: Medicare PPO

## 2014-02-09 LAB — CBC
HCT: 39.1 % (ref 39.0–52.0)
Hemoglobin: 13 g/dL (ref 13.0–17.0)
MCH: 30.2 pg (ref 26.0–34.0)
MCHC: 33.2 g/dL (ref 30.0–36.0)
MCV: 90.7 fL (ref 78.0–100.0)
PLATELETS: 202 10*3/uL (ref 150–400)
RBC: 4.31 MIL/uL (ref 4.22–5.81)
RDW: 14.9 % (ref 11.5–15.5)
WBC: 10 10*3/uL (ref 4.0–10.5)

## 2014-02-09 LAB — BASIC METABOLIC PANEL
BUN: 28 mg/dL — ABNORMAL HIGH (ref 6–23)
CALCIUM: 9.4 mg/dL (ref 8.4–10.5)
CO2: 25 mEq/L (ref 19–32)
CREATININE: 1.57 mg/dL — AB (ref 0.50–1.35)
Chloride: 113 mEq/L — ABNORMAL HIGH (ref 96–112)
GFR calc Af Amer: 52 mL/min — ABNORMAL LOW (ref >90)
GFR calc non Af Amer: 45 mL/min — ABNORMAL LOW (ref >90)
Glucose, Bld: 162 mg/dL — ABNORMAL HIGH (ref 70–99)
Potassium: 3.8 mEq/L (ref 3.7–5.3)
Sodium: 153 mEq/L — ABNORMAL HIGH (ref 137–147)

## 2014-02-09 LAB — GLUCOSE, CAPILLARY
GLUCOSE-CAPILLARY: 150 mg/dL — AB (ref 70–99)
Glucose-Capillary: 120 mg/dL — ABNORMAL HIGH (ref 70–99)
Glucose-Capillary: 143 mg/dL — ABNORMAL HIGH (ref 70–99)
Glucose-Capillary: 149 mg/dL — ABNORMAL HIGH (ref 70–99)
Glucose-Capillary: 162 mg/dL — ABNORMAL HIGH (ref 70–99)

## 2014-02-09 NOTE — Progress Notes (Signed)
Stroke Team Progress Note  HISTORY William Collier is an 63 y.o. male who was last in contact with friends at about 1930 yesterday 02/05/2014. Friends were unable to contact him today 02/06/2014 and went to his home where they found him on the floor unresponsive. EMS was called at that time and the patient was brought to the ED. Patient continued to be unresponsive and required intubation. Patient currently intubated and on Diprovan. Patient has had a stroke in the past but reportedly lives alone and ambulates with a cane. Drags the left foot and is unable to use the left arm. Patient was not administered TPA secondary to delay in arrival. He was admitted to the neuro ICU for further evaluation and treatment.  SUBJECTIVE Sister and daughter at bedside. They met with Drs. Elsworth Soho and [place]. They are awaiting son to arrive, likely this afternoon.  OBJECTIVE Most recent Vital Signs: Filed Vitals:   02/09/14 0500 02/09/14 0600 02/09/14 0700 02/09/14 0800  BP: 133/88 130/88 146/94 153/99  Pulse: 100 85 97 90  Temp:      TempSrc:      Resp: _0 Height:      Weight:      SpO2: 100% 100% 100% 100%   CBG (last 3)   Recent Labs  02/08/14 2358 02/09/14 0357 02/09/14 0813  GLUCAP 120* 143* 162*    IV Fluid Intake:   . propofol Stopped (02/09/14 0812)    MEDICATIONS  . amLODipine  10 mg Per Tube Daily  . antiseptic oral rinse  15 mL Mouth Rinse QID  . aspirin  300 mg Rectal Daily  . chlorhexidine  15 mL Mouth Rinse BID  . enoxaparin (LOVENOX) injection  40 mg Subcutaneous Q24H  . feeding supplement (VITAL HIGH PROTEIN)  1,000 mL Per Tube Q24H  . metoprolol tartrate  25 mg Per Tube BID  . pantoprazole sodium  40 mg Per Tube Q1200   PRN:  sodium chloride, acetaminophen (TYLENOL) oral liquid 160 mg/5 mL, fentaNYL, fentaNYL, hydrALAZINE  Diet:  NPO  Activity:  Bedrest DVT Prophylaxis:  Lovenox 40 mg sq daily   CLINICALLY SIGNIFICANT STUDIES Basic Metabolic Panel:   Recent  Labs Lab 02/08/14 0400 02/09/14 0500  NA 152* 153*  K 4.0 3.8  CL 112 113*  CO2 24 25  GLUCOSE 104* 162*  BUN 28* 28*  CREATININE 1.74* 1.57*  CALCIUM 9.1 9.4   Liver Function Tests:   Recent Labs Lab 02/06/14 1456  AST 169*  ALT 54*  ALKPHOS 89  BILITOT 1.2  PROT 8.4*  ALBUMIN 4.0   CBC:   Recent Labs Lab 02/06/14 1456  02/08/14 0400 02/09/14 0500  WBC 20.6*  < > 11.9* 10.0  NEUTROABS 17.0*  --   --   --   HGB 15.8  < > 12.7* 13.0  HCT 45.7  < > 38.1* 39.1  MCV 90.5  < > 91.4 90.7  PLT 278  < > 202 202  < > = values in this interval not displayed. Coagulation:   Recent Labs Lab 02/06/14 1456  LABPROT 15.3*  INR 1.24   Cardiac Enzymes: No results found for this basename: CKTOTAL, CKMB, CKMBINDEX, TROPONINI,  in the last 168 hours Urinalysis:   Recent Labs Lab 02/06/14 1900  COLORURINE YELLOW  LABSPEC 1.013  PHURINE 6.5  GLUCOSEU NEGATIVE  HGBUR LARGE*  BILIRUBINUR NEGATIVE  KETONESUR NEGATIVE  PROTEINUR 30*  UROBILINOGEN 0.2  NITRITE NEGATIVE  LEUKOCYTESUR TRACE*   Lipid  Panel    Component Value Date/Time   CHOL 122 02/08/2010 2316   TRIG 71 12/22/2013 1201   HDL 58 12/22/2013 1201   HDL 47 02/08/2010 2316   CHOLHDL 2.9 12/22/2013 1201   CHOLHDL 2.6 Ratio 02/08/2010 2316   VLDL 15 02/08/2010 2316   LDLCALC 99 12/22/2013 1201   LDLCALC 60 02/08/2010 2316   HgbA1C  No results found for this basename: HGBA1C    Urine Drug Screen:     Component Value Date/Time   LABOPIA NONE DETECTED 02/06/2014 1900   COCAINSCRNUR NONE DETECTED 02/06/2014 1900   LABBENZ NONE DETECTED 02/06/2014 1900   AMPHETMU NONE DETECTED 02/06/2014 1900   THCU POSITIVE* 02/06/2014 1900   LABBARB NONE DETECTED 02/06/2014 1900    Alcohol Level:   Recent Labs Lab 02/06/14 1456  ETH <11     CT Cervical Spine 02/06/2014    1. Spondylosis, without acute fracture or subluxation in the cervical spine. Straightening of expected cervical lordosis could be positional, due to muscular spasm,  or ligamentous injury.    CT of the brain  02/06/2014   Interval left posterior MCA distribution cortically based infarct, likely acute to subacute. Resultant mild mass effect with 5 mm of left-to-right midline shift.   MRI/MRA of the brain  02/07/2014    1. Large, acute left MCA territory infarct with 5 mm of rightward midline shift. Associated left MCA inferior division conclusion. 2. Remote, large right MCA territory infarct with unchanged appearance of right M2 branch vessel occlusion. 3. Chronic pontine perforator infarct, new from prior MRI. Remote right basal ganglia infarct.   2D Echocardiogram    Carotid Doppler  Bilateral: 1-39% ICA stenosis. Vertebral artery flow is antegrade.  Right side is poorly visualized due to IJ line and bilateral poorly visualized due to immobility.  TEE   CXR   02/07/2014    Stable atelectasis/ consolidation of the left lower lobe.    02/06/2014   Tube and catheter positions as described without pneumothorax. Prominence of the aortic arch is probably indicative of chronic hypertensive change. There is mild atelectatic change in the left base. Elsewhere lungs are clear.    02/06/2014   Appropriate position of endotracheal tube.  Cardiomegaly without congestive failure.     EKG  sinus tachycardia, RBBB, PAC's noted, inferior infarct, old. For complete results please see formal report.   Therapy Recommendations   Physical Exam   Frail middle aged african [place] male intubated.off propofol drip. . Afebrile. Head is nontraumatic. Neck is supple without bruit.  . Cardiac exam no murmur or gallop. Lungs are clear to auscultation. Distal pulses are well felt. Neurological Exam : Lethargic. Partially opens eyes to sternal rub. Left gaze deviation. Will not cross midline even with reflex eye movements. Pupils 2 mm small sluggishly reactive. Does not blink to threat bilaterally. Right lower facial weakness. Tongue midline. Purposeful antigravity movements on the right side.  Moves left side purposefully. Right plantar equivocal left downgoing.  ASSESSMENT Mr. William Collier is a 63 y.o. male found down unresponsive, required intubation. Imaging confirms a left MCA infarct in the setting of old right brain infarct and cerebral edema with 15m left-to-right shift.  Placed on 3% protocol (now off).  Infarct felt to be embolic secondary to unknown source. TEE completed without source found. On aspirin 325 mg orally every day prior to admission. Now on aspirin 300 mg suppository for secondary stroke prevention. Patient intubated, left gaze preference, pupils non-reactive. Stroke work up underway.  Anticipate long-term neuro deficits with placement should he survive. Family considering their options.  Acute respiratory failure, intubated in the ED Acute encephalopathy Elevated LFTs Induced hypernatremia with 3% saline protocol in order to decrease/prevent cerebral edema, drip off 02/07/2014, Na remains elevated at 153. Do not add fluids in order to decrease. 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  F/u HgbA1c, 2D, TEE results  Further care plan discussion with son today by Dr. Leonie Man - **update, son has called and will not be here until late tonight. Plans are to meet with Dr. Leonie Man at [facility] tomorrow morning  Continue to let Na slowly drift down; do not lower with IVF Resume statin once able to swallow if pt agreeable D/w daughter, sister and answered questions. Prognosis quite poor.Family to  decide about level of care soon.  SIGNED Burnetta Sabin, MSN, RN, ANVP-BC, ANP-BC, GNP-BC [facility] Pager: [phone] 02/09/2014 8:42 AM  This patient is critically ill and at significant risk of neurological worsening, death and care requires constant monitoring of vital signs, hemodynamics,respiratory and cardiac monitoring,review of multiple databases, neurological assessment, discussion with family, other specialists and medical decision making of high complexity. I spent 35 minutes of neurocritical care time  in the care of  this patient.  I have personally obtained a history, examined the patient, evaluated imaging results, and formulated the assessment and plan of care. I agree with the above. Antony Contras, MD   To contact Stroke Continuity provider, please refer to [email or web address] After hours, contact General Neurology

## 2014-02-09 NOTE — Progress Notes (Signed)
Noted pt to have 21beat run of vtach around 1615. Reported to [place] , Therapist, sports with [facility]. Informed of no change in pt condition morning labs, and if MD's would like any additional labs. Will continue to monitor.

## 2014-02-09 NOTE — Progress Notes (Signed)
PULMONARY / CRITICAL CARE MEDICINE   Name: William Collier MRN: [id number] DOB: 1951-02-26    ADMISSION DATE:  02/06/2014 CONSULTATION DATE:  02/06/2014  REFERRING MD :  Doy Mince Neuro Hospitalist PRIMARY SERVICE: PCCM  CHIEF COMPLAINT:  Found down  BRIEF PATIENT DESCRIPTION: 63 y/o male with a past medical history of stroke was brought to the emergency department at [facility] on 02/06/2014 after he had been found unresponsive. He was found to have evidence of a subacute left posterior MCA distribution stroke. PMH - Htn, CKD  SIGNIFICANT EVENTS / STUDIES:  June 7 CT c-spine> spondylosis June 7 CT head> interval left posterior MCA distribution acute to subacute infarct, 5 mm left-to-right midline shift; remote R MCA stroke 6/8 MRI - Large, acute left MCA territory infarct with 5 mm of rightward midline shift. Associated left MCA inferior division conclusion.  Remote, large right MCA territory infarct .Chronic pontine perforator infarct, new from prior MRI. Remote right basal ganglia infarct   LINES / TUBES: 02/06/2014 ETtube>   CULTURES: 02/06/2014 blood culture> ng  ANTIBIOTICS:   SUBJECTIVE: Intubated, afebrile    VITAL SIGNS: Temp:  [98 F (36.7 C)-99.3 F (37.4 C)] 98 F (36.7 C) (06/10 0421) Pulse Rate:  [46-100] 90 (06/10 0800) Resp:  [13-22] 19 (06/10 0800) BP: (130-192)/(86-115) 153/99 mmHg (06/10 0800) SpO2:  [100 %] 100 % (06/10 0800) FiO2 (%):  [40 %] 40 % (06/10 0810) Weight:  [75 kg (165 lb 5.5 oz)] 75 kg (165 lb 5.5 oz) (06/10 0430) HEMODYNAMICS:   VENTILATOR SETTINGS: Vent Mode:  [-] PSV;CPAP FiO2 (%):  [40 %] 40 % Set Rate:  [14 bmp] 14 bmp Vt Set:  [520 mL] 520 mL PEEP:  [5 cmH20] 5 cmH20 Pressure Support:  [5 cmH20-8 cmH20] 5 cmH20 Plateau Pressure:  [13 cmH20-16 cmH20] 13 cmH20 INTAKE / OUTPUT: Intake/Output     06/09 0701 - 06/10 0700 06/10 0701 - 06/11 0700   I.V. (mL/kg) 468.8 (6.3)    NG/GT 450    Total Intake(mL/kg) 918.8 (12.3)    Urine (mL/kg/hr) 1695 (0.9) 155 (0.9)   Total Output 1695 155   Net -776.2 -155          PHYSICAL EXAMINATION: Gen: chronically ill,on vent, no acute distress HEENT: NCAT, PERRL, ETT in place PULM: rhonchi bilaterally CV: RRR, no mgr, no JVD AB: BS+, soft, nontender, no hsm Ext: warm, no edema, no clubbing, no cyanosis Derm: large bruise left shin noted, otherwise no breakdown Neuro: semi purposeful/ withdraws on right, left paralysed, does not follow commands   LABS:  CBC  Recent Labs Lab 02/07/14 0520 02/08/14 0400 02/09/14 0500  WBC 13.6* 11.9* 10.0  HGB 12.3* 12.7* 13.0  HCT 36.8* 38.1* 39.1  PLT 203 202 202   Coag's  Recent Labs Lab 02/06/14 1456  APTT 25  INR 1.24   BMET  Recent Labs Lab 02/07/14 0520 02/08/14 0400 02/09/14 0500  NA 154* 152* 153*  K 3.9 4.0 3.8  CL 119* 112 113*  CO2 24 24 25   BUN 26* 28* 28*  CREATININE 1.76* 1.74* 1.57*  GLUCOSE 123* 104* 162*   Electrolytes  Recent Labs Lab 02/07/14 0520 02/08/14 0400 02/09/14 0500  CALCIUM 8.9 9.1 9.4   Sepsis Markers  Recent Labs Lab 02/06/14 1726  LATICACIDVEN 2.9*   ABG  Recent Labs Lab 02/06/14 1613 02/06/14 1709  PHART 7.296* 7.376  PCO2ART 50.3* 40.9  PO2ART 32.0* 526.0*   Liver Enzymes  Recent Labs Lab 02/06/14 1456  AST 169*  ALT 54*  ALKPHOS 89  BILITOT 1.2  ALBUMIN 4.0   Cardiac Enzymes No results found for this basename: TROPONINI, PROBNP,  in the last 168 hours Glucose  Recent Labs Lab 02/08/14 1143 02/08/14 1532 02/08/14 1958 02/08/14 2358 02/09/14 0357 02/09/14 0813  GLUCAP 104* 93 95 120* 143* 162*    Imaging  CXR: Cardiomegally, ETT 4.5 cm above carina  ASSESSMENT / PLAN:   NEUROLOGIC A:  Acute encephalopathy > most likely due to acute stroke Acute L MCA stroke P:   -Propofol, prn fentanyl for vent synchony, titrate to RASS goal: 0 -stroke care per neurology -Off 3% saline   PULMONARY A:Acute respiratory failure due to  inability to protect airway P:   --daily SBT/WUA, doubt he wil be able to protect airway post extubation -daily CXR  CARDIOVASCULAR A: Hypertension in setting of acute stroke P:  -resumed metoprolol & amlodipin -hydralazine prn  RENAL A:  CKD at baseline Mild hyperkalemia > spontaneously improving Hypernatremia> likely from being found down  P:   -Let Na autocorrect  GASTROINTESTINAL A:  No acute issues P:   -ct TFs -Pantoprazole for stress ulcer prophylaxis  HEMATOLOGIC A:  No acute issue P:  -monitor for bleeding  INFECTIOUS A:  Leukocytosis but no sign of infection P:   -monitor WBC  ENDOCRINE A:  No acute issues P:   - goal 140-180  Code: Full  TODAY'S SUMMARY: Acute encephalopathy due to CVA-d/w daughter - they feel he would not want tstomy/ peg, will let them think over prior to trial of extubation  I have personally obtained a history, examined the patient, evaluated laboratory and imaging results, formulated the assessment and plan and placed orders. CRITICAL CARE: The patient is critically ill with multiple organ systems failure and requires high complexity decision making for assessment and support, frequent evaluation and titration of therapies, application of advanced monitoring technologies and extensive interpretation of multiple databases. Critical Care Time devoted to patient care services described in this note is 35 minutes.    Kara Mead MD. Shade Flood. [facility] Pager [phone] If no response call [phone]     02/09/2014, 9:19 AM

## 2014-02-10 LAB — BASIC METABOLIC PANEL
BUN: 34 mg/dL — ABNORMAL HIGH (ref 6–23)
CALCIUM: 10 mg/dL (ref 8.4–10.5)
CO2: 26 meq/L (ref 19–32)
CREATININE: 1.69 mg/dL — AB (ref 0.50–1.35)
Chloride: 111 mEq/L (ref 96–112)
GFR calc Af Amer: 48 mL/min — ABNORMAL LOW (ref >90)
GFR calc non Af Amer: 41 mL/min — ABNORMAL LOW (ref >90)
Glucose, Bld: 162 mg/dL — ABNORMAL HIGH (ref 70–99)
Potassium: 3.7 mEq/L (ref 3.7–5.3)
Sodium: 154 mEq/L — ABNORMAL HIGH (ref 137–147)

## 2014-02-10 LAB — CBC
HCT: 39.9 % (ref 39.0–52.0)
Hemoglobin: 13.1 g/dL (ref 13.0–17.0)
MCH: 30.1 pg (ref 26.0–34.0)
MCHC: 32.8 g/dL (ref 30.0–36.0)
MCV: 91.7 fL (ref 78.0–100.0)
Platelets: 203 10*3/uL (ref 150–400)
RBC: 4.35 MIL/uL (ref 4.22–5.81)
RDW: 15.3 % (ref 11.5–15.5)
WBC: 10.4 10*3/uL (ref 4.0–10.5)

## 2014-02-10 LAB — GLUCOSE, CAPILLARY
GLUCOSE-CAPILLARY: 120 mg/dL — AB (ref 70–99)
Glucose-Capillary: 123 mg/dL — ABNORMAL HIGH (ref 70–99)
Glucose-Capillary: 141 mg/dL — ABNORMAL HIGH (ref 70–99)
Glucose-Capillary: 144 mg/dL — ABNORMAL HIGH (ref 70–99)
Glucose-Capillary: 146 mg/dL — ABNORMAL HIGH (ref 70–99)
Glucose-Capillary: 151 mg/dL — ABNORMAL HIGH (ref 70–99)
Glucose-Capillary: 156 mg/dL — ABNORMAL HIGH (ref 70–99)
Glucose-Capillary: 174 mg/dL — ABNORMAL HIGH (ref 70–99)

## 2014-02-10 LAB — MAGNESIUM: MAGNESIUM: 2.1 mg/dL (ref 1.5–2.5)

## 2014-02-10 LAB — TRIGLYCERIDES: Triglycerides: 114 mg/dL (ref <150)

## 2014-02-10 NOTE — Progress Notes (Signed)
Stroke Team Progress Note  HISTORY William Collier is an 63 y.o. male who was last in contact with friends at about 1930 yesterday 02/05/2014. Friends were unable to contact him today 02/06/2014 and went to his home where they found him on the floor unresponsive. EMS was called at that time and the patient was brought to the ED. Patient continued to be unresponsive and required intubation. Patient currently intubated and on Diprovan. Patient has had a stroke in the past but reportedly lives alone and ambulates with a cane. Drags the left foot and is unable to use the left arm. Patient was not administered TPA secondary to delay in arrival. He was admitted to the neuro ICU for further evaluation and treatment.  SUBJECTIVE No family at the bedside. RT says he is weaning.   OBJECTIVE Most recent Vital Signs: Filed Vitals:   02/10/14 0600 02/10/14 0700 02/10/14 0740 02/10/14 0827  BP: 165/102 138/92  120/82  Pulse: 107 84  94  Temp:      TempSrc:      Resp: 19 19 16 20   Height:      Weight:      SpO2: 100% 100% 100% 100%   CBG (last 3)   Recent Labs  02/09/14 2353 02/10/14 0319 02/10/14 0747  GLUCAP 123* 156* 151*    IV Fluid Intake:   . propofol 20 mcg/kg/min (02/10/14 0740)    MEDICATIONS  . amLODipine  10 mg Per Tube Daily  . antiseptic oral rinse  15 mL Mouth Rinse QID  . aspirin  300 mg Rectal Daily  . chlorhexidine  15 mL Mouth Rinse BID  . enoxaparin (LOVENOX) injection  40 mg Subcutaneous Q24H  . feeding supplement (VITAL HIGH PROTEIN)  1,000 mL Per Tube Q24H  . metoprolol tartrate  25 mg Per Tube BID  . pantoprazole sodium  40 mg Per Tube Q1200   PRN:  sodium chloride, acetaminophen (TYLENOL) oral liquid 160 mg/5 mL, fentaNYL, fentaNYL, hydrALAZINE  Diet:  NPO  Activity:  Bedrest DVT Prophylaxis:  Lovenox 40 mg sq daily   CLINICALLY SIGNIFICANT STUDIES Basic Metabolic Panel:   Recent Labs Lab 02/09/14 0500 02/10/14 0450  NA 153* 154*  K 3.8 3.7  CL 113* 111   CO2 25 26  GLUCOSE 162* 162*  BUN 28* 34*  CREATININE 1.57* 1.69*  CALCIUM 9.4 10.0   Liver Function Tests:   Recent Labs Lab 02/06/14 1456  AST 169*  ALT 54*  ALKPHOS 89  BILITOT 1.2  PROT 8.4*  ALBUMIN 4.0   CBC:   Recent Labs Lab 02/06/14 1456  02/09/14 0500 02/10/14 0450  WBC 20.6*  < > 10.0 10.4  NEUTROABS 17.0*  --   --   --   HGB 15.8  < > 13.0 13.1  HCT 45.7  < > 39.1 39.9  MCV 90.5  < > 90.7 91.7  PLT 278  < > 202 203  < > = values in this interval not displayed. Coagulation:   Recent Labs Lab 02/06/14 1456  LABPROT 15.3*  INR 1.24   Cardiac Enzymes: No results found for this basename: CKTOTAL, CKMB, CKMBINDEX, TROPONINI,  in the last 168 hours Urinalysis:   Recent Labs Lab 02/06/14 1900  COLORURINE YELLOW  LABSPEC 1.013  PHURINE 6.5  GLUCOSEU NEGATIVE  HGBUR LARGE*  BILIRUBINUR NEGATIVE  KETONESUR NEGATIVE  PROTEINUR 30*  UROBILINOGEN 0.2  NITRITE NEGATIVE  LEUKOCYTESUR TRACE*   Lipid Panel    Component Value Date/Time  CHOL 122 02/08/2010 2316   TRIG 114 02/10/2014 0640   HDL 58 12/22/2013 1201   HDL 47 02/08/2010 2316   CHOLHDL 2.9 12/22/2013 1201   CHOLHDL 2.6 Ratio 02/08/2010 2316   VLDL 15 02/08/2010 2316   LDLCALC 99 12/22/2013 1201   LDLCALC 60 02/08/2010 2316   HgbA1C  No results found for this basename: HGBA1C    Urine Drug Screen:     Component Value Date/Time   LABOPIA NONE DETECTED 02/06/2014 1900   COCAINSCRNUR NONE DETECTED 02/06/2014 1900   LABBENZ NONE DETECTED 02/06/2014 1900   AMPHETMU NONE DETECTED 02/06/2014 1900   THCU POSITIVE* 02/06/2014 1900   LABBARB NONE DETECTED 02/06/2014 1900    Alcohol Level:   Recent Labs Lab 02/06/14 1456  ETH <11     CT Cervical Spine 02/06/2014    1. Spondylosis, without acute fracture or subluxation in the cervical spine. Straightening of expected cervical lordosis could be positional, due to muscular spasm, or ligamentous injury.    CT of the brain  02/06/2014   Interval left posterior  MCA distribution cortically based infarct, likely acute to subacute. Resultant mild mass effect with 5 mm of left-to-right midline shift.   MRI/MRA of the brain  02/07/2014    1. Large, acute left MCA territory infarct with 5 mm of rightward midline shift. Associated left MCA inferior division conclusion. 2. Remote, large right MCA territory infarct with unchanged appearance of right M2 branch vessel occlusion. 3. Chronic pontine perforator infarct, new from prior MRI. Remote right basal ganglia infarct.   Carotid Doppler  Bilateral: 1-39% ICA stenosis. Vertebral artery flow is antegrade.  Right side is poorly visualized due to IJ line and bilateral poorly visualized due to immobility.  TEE 02/09/2014 EF 55-60%, no source of embolus. Dilated sinus of valsalva and proximal aortic root with no disection seen Descending thoracic aorta also seems enlarged and impinges on LA There is significant spontaneous contrast in the descending thoracic aorta without debris/disection.  CXR   02/07/2014    Stable atelectasis/ consolidation of the left lower lobe.    02/06/2014   Tube and catheter positions as described without pneumothorax. Prominence of the aortic arch is probably indicative of chronic hypertensive change. There is mild atelectatic change in the left base. Elsewhere lungs are clear.    02/06/2014   Appropriate position of endotracheal tube.  Cardiomegaly without congestive failure.     EKG  sinus tachycardia, RBBB, PAC's noted, inferior infarct, old. For complete results please see formal report.   Therapy Recommendations   Physical Exam   Frail middle aged african [place] male intubated.off propofol drip. . Afebrile. Head is nontraumatic. Neck is supple without bruit.  . Cardiac exam no murmur or gallop. Lungs are clear to auscultation. Distal pulses are well felt. Neurological Exam : Lethargic. Partially opens eyes to sternal rub. Left gaze deviation. Will not cross midline even with reflex eye  movements. Pupils 2 mm small sluggishly reactive. Does not blink to threat bilaterally. Right lower facial weakness. Tongue midline. Purposeful antigravity movements on the right side. Moves left side purposefully. Right plantar equivocal left downgoing.  ASSESSMENT William Collier is a 63 y.o. male found down unresponsive, required intubation. Imaging confirms a left MCA infarct in the setting of old right brain infarct and cerebral edema with 91mm left-to-right shift.  Placed on 3% protocol (now off).  Infarct felt to be embolic secondary to unknown source. TEE completed without source found. On aspirin 325 mg orally  every day prior to admission. Now on aspirin 300 mg suppository for secondary stroke prevention. Patient intubated, left gaze preference, pupils non-reactive. Stroke work up completed. Anticipate long-term neuro deficits with placement should he survive, poor quality of life. Family considering their options.  Acute respiratory failure, intubated in the ED Acute encephalopathy Elevated LFTs Induced hypernatremia with 3% saline protocol in order to decrease/prevent cerebral edema, drip off 02/07/2014, Na remains elevated at 154. Do not add fluids in order to decrease. 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  F/u HgbA1c  D/c 3% protocol. Continue to let Na slowly drift down Resume statin once able to swallow if pt agreeable Do not extubate until family decision made given risk of need of  reintuation Await son to make decision about palliative care vs trach and PEG.  Discussed with son and Dr. Elsworth Soho and answered questions  SIGNED Burnetta Sabin, MSN, RN, ANVP-BC, ANP-BC, GNP-BC [facility] Pager: [phone] 02/10/2014 8:37 AM  This patient is critically ill and at significant risk of neurological worsening, death and care requires constant monitoring of vital signs, hemodynamics,respiratory and cardiac monitoring,review of multiple databases, neurological assessment, discussion with family, other specialists and medical decision making of high complexity. I spent 30 minutes of neurocritical care time  in the care of  this patient.  I have personally obtained a history, examined the patient, evaluated imaging results, and formulated the assessment and plan of care. I agree with the above.  Antony Contras, MD   To contact Stroke Continuity provider, please refer to [email or web address] After hours, contact General Neurology

## 2014-02-10 NOTE — Progress Notes (Signed)
[facility] Progress Note Patient Name: William Collier DOB: 09-27-1950 MRN: [id number]  Date of Service  02/10/2014   HPI/Events of Note   Prior shift reports of N S Vtach 20 beats  eICU Interventions  Check mag and phos   Intervention Category Minor Interventions: Routine modifications to care plan (e.g. PRN medications for pain, fever)  Ariadne Rissmiller 02/10/2014, 1:02 AM

## 2014-02-10 NOTE — Progress Notes (Signed)
Pt's son has arrived from [place] - states he'll be here until 1115am today.  Elsworth Soho, MD from [facility] contacted and advised that pt's son states that he and his sister have decided that they want their father trached.    Miachel Roux, NP from neurology notified that pt's son is bedside until 1115am - she advised that Leonie Man, MD will be notified of son's arrival.  Educated pt's son of the importance that he remain bedside until he speaks with both physicians - pt's son verbalizes understanding.  Pt's status remains unchanged.  Will continue to closely monitor.

## 2014-02-10 NOTE — Progress Notes (Signed)
Informed Dr.  Chase Caller of pt CBG of 156, q4 CBG's, tube feeding, elevated readings prior to shift, and previous order. MD stated that "That is within range for ICU patients". Will d/c previous written order to contact MD's about range of CBGs

## 2014-02-10 NOTE — Progress Notes (Signed)
Pt's daughter has arrived - states she wants her father to have the trach.    Elsworth Soho, MD paged per instructions provided by MD.    Pt's son and daughter present stating that they have changed their minds and that they no longer want the trach but want to know what time they'll be "pulling the tube" tomorrow.  Pt's family advised that they will extubate approximately 0900 tomorrow morning.  Pt's family verbalize understanding and are reminded that if they have any further questions or concerns that the MD is available via phone.    Pt's family currently bedside - support provided and questions answered.  No changes in pt's status - will continue to closely monitor.

## 2014-02-10 NOTE — Plan of Care (Signed)
Problem: tPA Day Progression Outcomes-Only if tPA administered Goal: Inclusion criteria for tPA STANDARD: < 3hours from symptoms onset: - Diagnosis of ischemic stroke causing measureable neurological deficit - Neurological signs shold not be minor & isolated - Symptoms of stroke should not be sugestive of SAH - No head trauma or prior stroke in previous 3 months - No gastrointestinal or urinary tract hemorrhage in previous 21 days - No arterial puncture at a noncompressible site in previous 7 days - BP not elevated [systolic <073 mmHg, diastolic < 710 mmHg] - Not taking oral anticoagulant or if being taken INR < or equal 1.7 - Platelet count > or equal 100,000 mm3 - No seizure w/postictal residual neurological impairments - Pt/family understand potential risks/benefits from treatment - Caution in pts with NIHSS > 22 - Seizure at stroke onset eligible for tPA if residual impairments due to stroke and not the seizures. - Neurological signs should not be clearing spontaneously - Exercise caution in treating pt with major deficits - Symptoms onset < 3hrs before beginning treatment - No MI in previous 3 months - No major surgery in previous 14 days - No history previous intracranial hemorrhage - No evidence active bleeding/acute trauma (fracture) on examinations - If receiving heparin in previous 48 hrs, aPTT must be normal range - Abnormal Blood Glucose < 50 or > 400 mg/dL - CT does not show multilobar infarction [hypodensity >1/3 cerebral hemisphere] EXTENDED: 3-4.5 hrous from symptom onset - Age > 80 years - History of prior stroke and diabetes - Any anticoagulant use prior to admission (even if INR < 1.7) - NIHSS > 25"  Unknown time down

## 2014-02-10 NOTE — Progress Notes (Signed)
PULMONARY / CRITICAL CARE MEDICINE   Name: William Collier MRN: [id number] DOB: 1950-12-01    ADMISSION DATE:  02/06/2014 CONSULTATION DATE:  02/06/2014  REFERRING MD :  Doy Mince Neuro Hospitalist PRIMARY SERVICE: PCCM  CHIEF COMPLAINT:  Found down  BRIEF PATIENT DESCRIPTION: 63 y/o male with a past medical history of stroke was brought to the emergency department at [facility] on 02/06/2014 after he had been found unresponsive. He was found to have evidence of a subacute left posterior MCA distribution stroke. PMH - Htn, CKD  SIGNIFICANT EVENTS / STUDIES:  June 7 CT c-spine> spondylosis June 7 CT head> interval left posterior MCA distribution acute to subacute infarct, 5 mm left-to-right midline shift; remote R MCA stroke 6/8 MRI - Large, acute left MCA territory infarct with 5 mm of rightward midline shift. Associated left MCA inferior division conclusion.  Remote, large right MCA territory infarct .Chronic pontine perforator infarct, new from prior MRI. Remote right basal ganglia infarct   LINES / TUBES: 02/06/2014 ETtube>   CULTURES: 02/06/2014 blood culture> ng  ANTIBIOTICS:   SUBJECTIVE:  Int agitation requiring propofol gtt Intubated, afebrile    VITAL SIGNS: Temp:  [97.5 F (36.4 C)-99.4 F (37.4 C)] 98.5 F (36.9 C) (06/11 0319) Pulse Rate:  [59-110] 94 (06/11 0827) Resp:  [13-21] 16 (06/11 0900) BP: (120-167)/(82-114) 144/98 mmHg (06/11 0900) SpO2:  [97 %-100 %] 100 % (06/11 0900) FiO2 (%):  [40 %] 40 % (06/11 0827) Weight:  [73.8 kg (162 lb 11.2 oz)] 73.8 kg (162 lb 11.2 oz) (06/11 0400) HEMODYNAMICS:   VENTILATOR SETTINGS: Vent Mode:  [-] PSV;CPAP FiO2 (%):  [40 %] 40 % Set Rate:  [14 bmp] 14 bmp Vt Set:  [520 mL] 520 mL PEEP:  [5 cmH20] 5 cmH20 Pressure Support:  [5 cmH20-10 cmH20] 5 cmH20 Plateau Pressure:  [12 cmH20-16 cmH20] 16 cmH20 INTAKE / OUTPUT: Intake/Output     06/10 0701 - 06/11 0700 06/11 0701 - 06/12 0700   I.V. (mL/kg) 279.8 (3.8)     NG/GT 1500    Total Intake(mL/kg) 1779.8 (24.1)    Urine (mL/kg/hr) 1445 (0.8)    Total Output 1445     Net +334.8          Urine Occurrence 1 x      PHYSICAL EXAMINATION: Gen: chronically ill,on vent, no acute distress HEENT: NCAT, PERRL, ETT in place PULM: rhonchi bilaterally CV: RRR, no mgr, no JVD AB: BS+, soft, nontender, no hsm Ext: warm, no edema, no clubbing, no cyanosis Derm: large bruise left shin noted, otherwise no breakdown Neuro: semi purposeful/ withdraws on right, left paralysed, does not follow commands   LABS:  CBC  Recent Labs Lab 02/08/14 0400 02/09/14 0500 02/10/14 0450  WBC 11.9* 10.0 10.4  HGB 12.7* 13.0 13.1  HCT 38.1* 39.1 39.9  PLT 202 202 203   Coag's  Recent Labs Lab 02/06/14 1456  APTT 25  INR 1.24   BMET  Recent Labs Lab 02/08/14 0400 02/09/14 0500 02/10/14 0450  NA 152* 153* 154*  K 4.0 3.8 3.7  CL 112 113* 111  CO2 24 25 26   BUN 28* 28* 34*  CREATININE 1.74* 1.57* 1.69*  GLUCOSE 104* 162* 162*   Electrolytes  Recent Labs Lab 02/08/14 0400 02/09/14 0500 02/10/14 0450  CALCIUM 9.1 9.4 10.0   Sepsis Markers  Recent Labs Lab 02/06/14 1726  LATICACIDVEN 2.9*   ABG  Recent Labs Lab 02/06/14 1613 02/06/14 1709  PHART 7.296* 7.376  PCO2ART 50.3*  40.9  PO2ART 32.0* 526.0*   Liver Enzymes  Recent Labs Lab 02/06/14 1456  AST 169*  ALT 54*  ALKPHOS 89  BILITOT 1.2  ALBUMIN 4.0   Cardiac Enzymes No results found for this basename: TROPONINI, PROBNP,  in the last 168 hours Glucose  Recent Labs Lab 02/09/14 1152 02/09/14 1553 02/09/14 2000 02/09/14 2353 02/10/14 0319 02/10/14 0747  GLUCAP 150* 149* 141* 123* 156* 151*    Imaging  CXR: Cardiomegally, ETT 4.5 cm above carina  ASSESSMENT / PLAN:   NEUROLOGIC A:  Acute encephalopathy > most likely due to acute stroke Acute L MCA stroke P:   -Propofol, prn fentanyl for vent synchony, titrate to RASS goal: 0 -stroke care per  neurology -Off 3% saline   PULMONARY A:Acute respiratory failure due to inability to protect airway P:   --daily SBT/WUA, doubt he wil be able to protect airway post extubation -daily CXR  CARDIOVASCULAR A: Hypertension in setting of acute stroke P:  -resumed metoprolol & amlodipin -hydralazine prn  RENAL A:  CKD at baseline Mild hyperkalemia > spontaneously improving Hypernatremia> likely from being found down  P:   -Let Na autocorrect  GASTROINTESTINAL A:  No acute issues P:   -ct TFs -Pantoprazole for stress ulcer prophylaxis  HEMATOLOGIC A:  No acute issue P:  -monitor for bleeding  INFECTIOUS A:  Leukocytosis but no sign of infection P:   -monitor WBC  ENDOCRINE A:  No acute issues P:   - goal 140-180  Code: Full  TODAY'S SUMMARY: Acute encephalopathy due to CVA-d/w daughter - they feel he would not want tstomy/ peg, ongoing discussion - plan for one way extubation once family on board  I have personally obtained a history, examined the patient, evaluated laboratory and imaging results, formulated the assessment and plan and placed orders. CRITICAL CARE: The patient is critically ill with multiple organ systems failure and requires high complexity decision making for assessment and support, frequent evaluation and titration of therapies, application of advanced monitoring technologies and extensive interpretation of multiple databases. Critical Care Time devoted to patient care services described in this note is 35 minutes.    Kara Mead MD. Shade Flood.  Pulmonary & Critical care Pager [phone] If no response call [phone]     02/10/2014, 9:28 AM

## 2014-02-11 ENCOUNTER — Inpatient Hospital Stay (HOSPITAL_COMMUNITY): Payer: Medicare PPO

## 2014-02-11 LAB — GLUCOSE, CAPILLARY
GLUCOSE-CAPILLARY: 195 mg/dL — AB (ref 70–99)
GLUCOSE-CAPILLARY: 127 mg/dL — AB (ref 70–99)
GLUCOSE-CAPILLARY: 116 mg/dL — AB (ref 70–99)
GLUCOSE-CAPILLARY: 110 mg/dL — AB (ref 70–99)
Glucose-Capillary: 135 mg/dL — ABNORMAL HIGH (ref 70–99)

## 2014-02-11 LAB — CBC
HCT: 39 % (ref 39.0–52.0)
Hemoglobin: 12.6 g/dL — ABNORMAL LOW (ref 13.0–17.0)
MCH: 30.1 pg (ref 26.0–34.0)
MCHC: 32.3 g/dL (ref 30.0–36.0)
MCV: 93.1 fL (ref 78.0–100.0)
PLATELETS: 213 10*3/uL (ref 150–400)
RBC: 4.19 MIL/uL — ABNORMAL LOW (ref 4.22–5.81)
RDW: 15.4 % (ref 11.5–15.5)
WBC: 10.7 10*3/uL — ABNORMAL HIGH (ref 4.0–10.5)

## 2014-02-11 LAB — BASIC METABOLIC PANEL
BUN: 44 mg/dL — ABNORMAL HIGH (ref 6–23)
CALCIUM: 9.6 mg/dL (ref 8.4–10.5)
CO2: 28 meq/L (ref 19–32)
CREATININE: 1.72 mg/dL — AB (ref 0.50–1.35)
Chloride: 112 mEq/L (ref 96–112)
GFR calc Af Amer: 47 mL/min — ABNORMAL LOW (ref >90)
GFR calc non Af Amer: 41 mL/min — ABNORMAL LOW (ref >90)
Glucose, Bld: 185 mg/dL — ABNORMAL HIGH (ref 70–99)
Potassium: 4.1 mEq/L (ref 3.7–5.3)
SODIUM: 154 meq/L — AB (ref 137–147)

## 2014-02-11 MED ORDER — PANTOPRAZOLE SODIUM 40 MG IV SOLR
40.0000 mg | Freq: Every day | INTRAVENOUS | Status: DC
  Administered 2014-02-11 – 2014-02-13 (×3): 40 mg via INTRAVENOUS
  Filled 2014-02-11 (×3): qty 40

## 2014-02-11 MED ORDER — MORPHINE SULFATE 2 MG/ML IJ SOLN
2.0000 mg | INTRAMUSCULAR | Status: DC | PRN
  Administered 2014-02-11 (×4): 2 mg via INTRAVENOUS
  Administered 2014-02-13 – 2014-02-14 (×2): 4 mg via INTRAVENOUS
  Filled 2014-02-11: qty 2
  Filled 2014-02-11 (×3): qty 1
  Filled 2014-02-11: qty 2
  Filled 2014-02-11: qty 1

## 2014-02-11 MED ORDER — METOPROLOL TARTRATE 1 MG/ML IV SOLN
5.0000 mg | Freq: Four times a day (QID) | INTRAVENOUS | Status: DC
  Administered 2014-02-11 – 2014-02-13 (×8): 5 mg via INTRAVENOUS
  Filled 2014-02-11 (×12): qty 5

## 2014-02-11 NOTE — Progress Notes (Signed)
PULMONARY / CRITICAL CARE MEDICINE   Name: William Collier MRN: [id number] DOB: July 18, 1951    ADMISSION DATE:  02/06/2014 CONSULTATION DATE:  02/06/2014  REFERRING MD :  Doy Mince Neuro Hospitalist PRIMARY SERVICE: PCCM  CHIEF COMPLAINT:  Found down  BRIEF PATIENT DESCRIPTION: 63 y/o male with a past medical history of stroke was brought to the emergency department at [facility] on 02/06/2014 after he had been found unresponsive. He was found to have evidence of a subacute left posterior MCA distribution stroke. PMH - Htn, CKD  SIGNIFICANT EVENTS / STUDIES:  June 7 CT c-spine> spondylosis June 7 CT head> interval left posterior MCA distribution acute to subacute infarct, 5 mm left-to-right midline shift; remote R MCA stroke 6/8 MRI - Large, acute left MCA territory infarct with 5 mm of rightward midline shift. Associated left MCA inferior division conclusion.  Remote, large right MCA territory infarct .Chronic pontine perforator infarct, new from prior MRI. Remote right basal ganglia infarct   LINES / TUBES: 02/06/2014 ETtube>   CULTURES: 02/06/2014 blood culture> ng  ANTIBIOTICS:   SUBJECTIVE:  Int agitation off propofol gtt Intubated, afebrile    VITAL SIGNS: Temp:  [97 F (36.1 C)-99.1 F (37.3 C)] 98.4 F (36.9 C) (06/12 0800) Pulse Rate:  [66-103] 98 (06/12 0802) Resp:  [14-25] 16 (06/12 0802) BP: (107-172)/(77-114) 172/114 mmHg (06/12 0802) SpO2:  [92 %-100 %] 100 % (06/12 0802) FiO2 (%):  [40 %] 40 % (06/12 0802) Weight:  [73.2 kg (161 lb 6 oz)] 73.2 kg (161 lb 6 oz) (06/12 0420) HEMODYNAMICS:   VENTILATOR SETTINGS: Vent Mode:  [-] PRVC FiO2 (%):  [40 %] 40 % Set Rate:  [14 bmp] 14 bmp Vt Set:  [520 mL] 520 mL PEEP:  [5 cmH20] 5 cmH20 Pressure Support:  [10 cmH20] 10 cmH20 Plateau Pressure:  [15 cmH20-16 cmH20] 15 cmH20 INTAKE / OUTPUT: Intake/Output     06/11 0701 - 06/12 0700 06/12 0701 - 06/13 0700   I.V. (mL/kg) 461.8 (6.3) 10 (0.1)   NG/GT 1340     Total Intake(mL/kg) 1801.8 (24.6) 10 (0.1)   Urine (mL/kg/hr) 1430 (0.8)    Total Output 1430     Net +371.8 +10          PHYSICAL EXAMINATION: Gen: chronically ill,on vent, no acute distress HEENT: NCAT, PERRL, ETT in place PULM: rhonchi bilaterally CV: RRR, no mgr, no JVD AB: BS+, soft, nontender, no hsm Ext: warm, no edema, no clubbing, no cyanosis Derm: large bruise left shin noted, otherwise no breakdown Neuro: semi purposeful/ withdraws on right, left paralysed, does not follow commands   LABS:  CBC  Recent Labs Lab 02/09/14 0500 02/10/14 0450 02/11/14 0615  WBC 10.0 10.4 10.7*  HGB 13.0 13.1 12.6*  HCT 39.1 39.9 39.0  PLT 202 203 213   Coag's  Recent Labs Lab 02/06/14 1456  APTT 25  INR 1.24   BMET  Recent Labs Lab 02/09/14 0500 02/10/14 0450 02/11/14 0615  NA 153* 154* 154*  K 3.8 3.7 4.1  CL 113* 111 112  CO2 25 26 28   BUN 28* 34* 44*  CREATININE 1.57* 1.69* 1.72*  GLUCOSE 162* 162* 185*   Electrolytes  Recent Labs Lab 02/09/14 0500 02/10/14 0450 02/10/14 0640 02/11/14 0615  CALCIUM 9.4 10.0  --  9.6  MG  --   --  2.1  --    Sepsis Markers  Recent Labs Lab 02/06/14 1726  LATICACIDVEN 2.9*   ABG  Recent Labs Lab 02/06/14  1613 02/06/14 1709  PHART 7.296* 7.376  PCO2ART 50.3* 40.9  PO2ART 32.0* 526.0*   Liver Enzymes  Recent Labs Lab 02/06/14 1456  AST 169*  ALT 54*  ALKPHOS 89  BILITOT 1.2  ALBUMIN 4.0   Cardiac Enzymes No results found for this basename: TROPONINI, PROBNP,  in the last 168 hours Glucose  Recent Labs Lab 02/10/14 0747 02/10/14 1152 02/10/14 1611 02/10/14 1947 02/10/14 2331 02/11/14 0420  GLUCAP 151* 120* 146* 174* 144* 195*    Imaging  CXR: Cardiomegally, ETT 4.5 cm above carina  ASSESSMENT / PLAN:   NEUROLOGIC A:  Acute encephalopathy > most likely due to acute stroke Acute L MCA stroke with shift--Off 3% saline  P:   -dc Propofol -stroke care per  neurology   PULMONARY A:Acute respiratory failure due to inability to protect airway P:   --doubt he wil be able to protect airway post extubation long term -daily CXR  CARDIOVASCULAR A: Hypertension in setting of acute stroke P:  -resumed metoprolol & amlodipin -hydralazine prn  RENAL A:  CKD at baseline Mild hyperkalemia > spontaneously improving Hypernatremia> likely from being found down  P:   -Let Na autocorrect  GASTROINTESTINAL A:  No acute issues P:   -dc TFs -Pantoprazole for stress ulcer prophylaxis  HEMATOLOGIC A:  No acute issue P:  -monitor for bleeding  INFECTIOUS A:  Leukocytosis but no sign of infection P:   -monitor WBC  ENDOCRINE A:  No acute issues P:   - goal 140-180  Code: DNR  TODAY'S SUMMARY: Acute encephalopathy due to CVA-d/w son/daughter - they feel he would not want tstomy/ peg, -proceed with one way extubation , if does not do well, use morphine for comfort On the other hand if does well, can consider rehab & address feeding issues  I have personally obtained a history, examined the patient, evaluated laboratory and imaging results, formulated the assessment and plan and placed orders. CRITICAL CARE: The patient is critically ill with multiple organ systems failure and requires high complexity decision making for assessment and support, frequent evaluation and titration of therapies, application of advanced monitoring technologies and extensive interpretation of multiple databases. Critical Care Time devoted to patient care services described in this note is 32 minutes.    Kara Mead MD. [place] Flood. [facility] Pager [phone] If no response call [phone]     02/11/2014, 8:54 AM

## 2014-02-11 NOTE — Progress Notes (Signed)
02/11/2014 Respiratory Care Note: Patient suctioned and terminally extubated as per MD order.  RN and family at bedside for procedure. Patient placed on 5L Nasal Cannula post-extubation.Pillow placed behind back to slightly extend neck to better open airway. Patient with moderate secretions and snoring.  Vent and ambu bag at bedside. Will follow patient progress.

## 2014-02-11 NOTE — Progress Notes (Signed)
Patient transferred with central line in place after several attempts to start a peripheral IV failed per MD instructions to leave central line in place if peripheral access was not obtained.

## 2014-02-11 NOTE — Progress Notes (Signed)
Stroke Team Progress Note  HISTORY William Collier is an 63 y.o. male who was last in contact with friends at about 1930 yesterday 02/05/2014. Friends were unable to contact him today 02/06/2014 and went to his home where they found him on the floor unresponsive. EMS was called at that time and the patient was brought to the ED. Patient continued to be unresponsive and required intubation. Patient currently intubated and on Diprovan. Patient has had a stroke in the past but reportedly lives alone and ambulates with a cane. Drags the left foot and is unable to use the left arm. Patient was not administered TPA secondary to delay in arrival. He was admitted to the neuro ICU for further evaluation and treatment.  SUBJECTIVE Family at bedside. Patient with spontaneous eye opening, L gaze deviation, non-verbal. Plan to extubate this morning.   OBJECTIVE Most recent Vital Signs: Filed Vitals:   02/11/14 0600 02/11/14 0614 02/11/14 0615 02/11/14 0700  BP: 154/103  140/98 138/101  Pulse: 87 92 94 94  Temp:      TempSrc:      Resp: 14 14 14 14   Height:      Weight:      SpO2: 100% 100% 100% 100%   CBG (last 3)   Recent Labs  02/10/14 1947 02/10/14 2331 02/11/14 0420  GLUCAP 174* 144* 195*    IV Fluid Intake:   . propofol 25 mcg/kg/min (02/11/14 0700)    MEDICATIONS  . amLODipine  10 mg Per Tube Daily  . antiseptic oral rinse  15 mL Mouth Rinse QID  . aspirin  300 mg Rectal Daily  . chlorhexidine  15 mL Mouth Rinse BID  . enoxaparin (LOVENOX) injection  40 mg Subcutaneous Q24H  . feeding supplement (VITAL HIGH PROTEIN)  1,000 mL Per Tube Q24H  . metoprolol tartrate  25 mg Per Tube BID  . pantoprazole sodium  40 mg Per Tube Q1200   PRN:  sodium chloride, acetaminophen (TYLENOL) oral liquid 160 mg/5 mL, fentaNYL, fentaNYL, hydrALAZINE  Diet:  NPO  Activity:  Bedrest DVT Prophylaxis:  Lovenox 40 mg sq daily   CLINICALLY SIGNIFICANT STUDIES Basic Metabolic Panel:   Recent Labs Lab  02/10/14 0450 02/10/14 0640 02/11/14 0615  NA 154*  --  154*  K 3.7  --  4.1  CL 111  --  112  CO2 26  --  28  GLUCOSE 162*  --  185*  BUN 34*  --  44*  CREATININE 1.69*  --  1.72*  CALCIUM 10.0  --  9.6  MG  --  2.1  --    Liver Function Tests:   Recent Labs Lab 02/06/14 1456  AST 169*  ALT 54*  ALKPHOS 89  BILITOT 1.2  PROT 8.4*  ALBUMIN 4.0   CBC:   Recent Labs Lab 02/06/14 1456  02/10/14 0450 02/11/14 0615  WBC 20.6*  < > 10.4 10.7*  NEUTROABS 17.0*  --   --   --   HGB 15.8  < > 13.1 12.6*  HCT 45.7  < > 39.9 39.0  MCV 90.5  < > 91.7 93.1  PLT 278  < > 203 213  < > = values in this interval not displayed. Coagulation:   Recent Labs Lab 02/06/14 1456  LABPROT 15.3*  INR 1.24   Cardiac Enzymes: No results found for this basename: CKTOTAL, CKMB, CKMBINDEX, TROPONINI,  in the last 168 hours Urinalysis:   Recent Labs Lab 02/06/14 [place]  LABSPEC 1.013  PHURINE 6.5  GLUCOSEU NEGATIVE  HGBUR LARGE*  BILIRUBINUR NEGATIVE  KETONESUR NEGATIVE  PROTEINUR 30*  UROBILINOGEN 0.2  NITRITE NEGATIVE  LEUKOCYTESUR TRACE*   Lipid Panel    Component Value Date/Time   CHOL 122 02/08/2010 2316   TRIG 114 02/10/2014 0640   HDL 58 12/22/2013 1201   HDL 47 02/08/2010 2316   CHOLHDL 2.9 12/22/2013 1201   CHOLHDL 2.6 Ratio 02/08/2010 2316   VLDL 15 02/08/2010 2316   LDLCALC 99 12/22/2013 1201   LDLCALC 60 02/08/2010 2316   HgbA1C  No results found for this basename: HGBA1C    Urine Drug Screen:     Component Value Date/Time   LABOPIA NONE DETECTED 02/06/2014 1900   COCAINSCRNUR NONE DETECTED 02/06/2014 1900   LABBENZ NONE DETECTED 02/06/2014 1900   AMPHETMU NONE DETECTED 02/06/2014 1900   THCU POSITIVE* 02/06/2014 1900   LABBARB NONE DETECTED 02/06/2014 1900    Alcohol Level:   Recent Labs Lab 02/06/14 1456  ETH <11     CT Cervical Spine 02/06/2014    1. Spondylosis, without acute fracture or subluxation in the cervical spine. Straightening of  expected cervical lordosis could be positional, due to muscular spasm, or ligamentous injury.    CT of the brain  02/06/2014   Interval left posterior MCA distribution cortically based infarct, likely acute to subacute. Resultant mild mass effect with 5 mm of left-to-right midline shift.   MRI/MRA of the brain  02/07/2014    1. Large, acute left MCA territory infarct with 5 mm of rightward midline shift. Associated left MCA inferior division conclusion. 2. Remote, large right MCA territory infarct with unchanged appearance of right M2 branch vessel occlusion. 3. Chronic pontine perforator infarct, new from prior MRI. Remote right basal ganglia infarct.   Carotid Doppler  Bilateral: 1-39% ICA stenosis. Vertebral artery flow is antegrade.  Right side is poorly visualized due to IJ line and bilateral poorly visualized due to immobility.  TEE 02/09/2014 EF 55-60%, no source of embolus. Dilated sinus of valsalva and proximal aortic root with no disection seen Descending thoracic aorta also seems enlarged and impinges on LA There is significant spontaneous contrast in the descending thoracic aorta without debris/disection.  CXR   02/07/2014    Stable atelectasis/ consolidation of the left lower lobe.    02/06/2014   Tube and catheter positions as described without pneumothorax. Prominence of the aortic arch is probably indicative of chronic hypertensive change. There is mild atelectatic change in the left base. Elsewhere lungs are clear.    02/06/2014   Appropriate position of endotracheal tube.  Cardiomegaly without congestive failure.     EKG  sinus tachycardia, RBBB, PAC's noted, inferior infarct, old. For complete results please see formal report.   Therapy Recommendations   Physical Exam   Frail middle aged african [place] male intubated.off propofol drip. . Afebrile. Head is nontraumatic. Neck is supple without bruit.  . Cardiac exam no murmur or gallop. Lungs are clear to auscultation. Distal pulses are  well felt. Neurological Exam : Lethargic. Partially opens eyes to sternal rub. Left gaze deviation. Will not cross midline even with reflex eye movements. Pupils 2 mm small sluggishly reactive. Does not blink to threat bilaterally. Right lower facial weakness. Tongue midline. Purposeful antigravity movements on the right side. Withdraws left side to pain. Right plantar equivocal left downgoing.  ASSESSMENT Mr. William Collier is a 63 y.o. male found down unresponsive, required intubation. Imaging confirms a left MCA infarct  in the setting of old right brain infarct and cerebral edema with 38mm left-to-right shift.  Placed on 3% protocol (now off).  Infarct felt to be embolic secondary to unknown source. TEE completed without source found. On aspirin 325 mg orally every day prior to admission. Now on aspirin 300 mg suppository for secondary stroke prevention. Patient intubated, left gaze preference, pupils non-reactive. Stroke work up completed. Anticipate long-term neuro deficits with placement should he survive, poor quality of life. Patient to be extubated this am.   Acute respiratory failure, intubated in the ED Acute encephalopathy Elevated LFTs Induced hypernatremia with 3% saline protocol in order to decrease/prevent cerebral edema, drip off 02/07/2014, Na remains elevated at 154. Do not add fluids in order to decrease. 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 Family at bedside. Discussed risks of reintubation, family expressed understanding.   Family aware of  need for palliative care vs feeding tube if extubation successful and patient not able to swallow  Continue aspirin 300 mg suppository daily for secondary stroke prevention.  F/u HgbA1c  D/c 3% protocol. Continue to let Na slowly drift down Resume statin once able to swallow if pt agreeable  Discussed with son and Dr. Elsworth Soho and answered questions  SIGNED Delbert Phenix, MSN, ANP-C, [facility] Stroke Team [phone] 02/11/2014 7:40 AM  This patient is critically ill and at significant risk of neurological worsening, death and care requires constant monitoring of vital signs, hemodynamics,respiratory and cardiac monitoring,review of multiple databases, neurological assessment, discussion with family, other specialists and medical decision making of high complexity. I spent 30 minutes of neurocritical care time  in the care of  this patient.  I have personally obtained a history, examined the patient, evaluated imaging results, and formulated the assessment and plan of care. I agree with the above. Antony Contras, MD   To contact Stroke Continuity provider, please refer to [email or web address] After hours, contact General Neurology

## 2014-02-12 LAB — CULTURE, BLOOD (ROUTINE X 2): Culture: NO GROWTH

## 2014-02-12 MED ORDER — PNEUMOCOCCAL 13-VAL CONJ VACC IM SUSP
0.5000 mL | INTRAMUSCULAR | Status: AC
  Administered 2014-02-13: 0.5 mL via INTRAMUSCULAR
  Filled 2014-02-12: qty 0.5

## 2014-02-12 NOTE — Progress Notes (Signed)
PULMONARY / CRITICAL CARE MEDICINE   Name: William Collier MRN: [id number] DOB: 10-07-1950    ADMISSION DATE:  02/06/2014 CONSULTATION DATE:  02/06/2014  REFERRING MD :  Doy Mince Neuro Hospitalist PRIMARY SERVICE: PCCM  CHIEF COMPLAINT:  Found down  BRIEF PATIENT DESCRIPTION: 63 y/o male with a past medical history of stroke was brought to the emergency department at [facility] on 02/06/2014 after he had been found unresponsive. He was found to have evidence of a subacute left posterior MCA distribution stroke. PMH - Htn, CKD  SIGNIFICANT EVENTS / STUDIES:  June 7 CT c-spine> spondylosis June 7 CT head> interval left posterior MCA distribution acute to subacute infarct, 5 mm left-to-right midline shift; remote R MCA stroke 6/8 MRI - Large, acute left MCA territory infarct with 5 mm of rightward midline shift. Associated left MCA inferior division conclusion.  Remote, large right MCA territory infarct .Chronic pontine perforator infarct, new from prior MRI. Remote right basal ganglia infarct 6/12 one way extubation  LINES / TUBES: 02/06/2014 ETtube> 6/12 6/7  RIJ CVL>>  CULTURES: 02/06/2014 blood culture> ng  ANTIBIOTICS: none  SUBJECTIVE:  On 4N,  Not protecting airway well   VITAL SIGNS: Temp:  [97.7 F (36.5 C)-98.7 F (37.1 C)] 98.5 F (36.9 C) (06/13 1037) Pulse Rate:  [71-109] 89 (06/13 1037) Resp:  [11-33] 18 (06/13 1037) BP: (126-170)/(85-125) 154/104 mmHg (06/13 1037) SpO2:  [95 %-100 %] 100 % (06/13 1037) Weight:  [75.2 kg (165 lb 12.6 oz)] 75.2 kg (165 lb 12.6 oz) (06/13 0500)       INTAKE / OUTPUT: Intake/Output     06/12 0701 - 06/13 0700 06/13 0701 - 06/14 0700   I.V. (mL/kg) 120 (1.6)    NG/GT     Total Intake(mL/kg) 120 (1.6)    Urine (mL/kg/hr) 725 (0.4)    Total Output 725     Net -605 0          PHYSICAL EXAMINATION: Gen: chronically ill, HEENT: NCAT, PERRL, not protecting airway PULM: rhonchi bilaterally CV: RRR, no mgr, no JVD AB:  BS+, soft, nontender, no hsm Ext: warm, no edema, no clubbing, no cyanosis Derm: large bruise left shin noted, otherwise no breakdown Neuro: semi purposeful/ withdraws on right, left paralysed, does not follow commands   LABS:  CBC  Recent Labs Lab 02/09/14 0500 02/10/14 0450 02/11/14 0615  WBC 10.0 10.4 10.7*  HGB 13.0 13.1 12.6*  HCT 39.1 39.9 39.0  PLT 202 203 213   Coag's  Recent Labs Lab 02/06/14 1456  APTT 25  INR 1.24   BMET  Recent Labs Lab 02/09/14 0500 02/10/14 0450 02/11/14 0615  NA 153* 154* 154*  K 3.8 3.7 4.1  CL 113* 111 112  CO2 25 26 28   BUN 28* 34* 44*  CREATININE 1.57* 1.69* 1.72*  GLUCOSE 162* 162* 185*   Electrolytes  Recent Labs Lab 02/09/14 0500 02/10/14 0450 02/10/14 0640 02/11/14 0615  CALCIUM 9.4 10.0  --  9.6  MG  --   --  2.1  --    Sepsis Markers  Recent Labs Lab 02/06/14 1726  LATICACIDVEN 2.9*   ABG  Recent Labs Lab 02/06/14 1613 02/06/14 1709  PHART 7.296* 7.376  PCO2ART 50.3* 40.9  PO2ART 32.0* 526.0*   Liver Enzymes  Recent Labs Lab 02/06/14 1456  AST 169*  ALT 54*  ALKPHOS 89  BILITOT 1.2  ALBUMIN 4.0   Cardiac Enzymes No results found for this basename: TROPONINI, PROBNP,  in the  last 168 hours Glucose  Recent Labs Lab 02/10/14 2331 02/11/14 0420 02/11/14 0833 02/11/14 1146 02/11/14 1541 02/11/14 1955  GLUCAP 144* 195* 127* 135* 116* 110*    Imaging  No film   ASSESSMENT / PLAN:   NEUROLOGIC A:  Acute encephalopathy > most likely due to acute stroke Acute L MCA stroke with shift-- P:   -stroke care per neurology   PULMONARY A:Acute respiratory failure due to inability to protect airway S/p extubation 6/12 P:   -no plans to reintubate  CARDIOVASCULAR A: Hypertension in setting of acute stroke P:  -resumed metoprolol & amlodipin -hydralazine prn  RENAL A:  CKD at baseline Mild hyperkalemia > spontaneously improving Hypernatremia> likely from being found down   P:   monitor  GASTROINTESTINAL A:  No acute issues P:   -dc TFs -Pantoprazole for stress ulcer prophylaxis  HEMATOLOGIC A:  No acute issue P:  -monitor for bleeding  INFECTIOUS A:  Leukocytosis but no sign of infection P:   -monitor WBC  ENDOCRINE A:  No acute issues P:   - goal 140-180  Code: DNR  TODAY'S SUMMARY: Acute encephalopathy due to CVA-d/w son/daughter - they feel he would not want tstomy/ peg, -pt extubated 6/12 and not tolerating this, not protecting airway.  Will call [facility] to assume medical care as of 02/13/14 and PCCM will be OFF.  Mariel Sleet Beeper  [phone]  Cell  [phone]  If no response or cell goes to voicemail, call beeper [phone]    02/12/2014, 10:44 AM

## 2014-02-12 NOTE — Progress Notes (Signed)
Stroke Team Progress Note  HISTORY William Collier is an 63 y.o. male who was last in contact with friends at about 1930 yesterday 02/05/2014. Friends were unable to contact him today 02/06/2014 and went to his home where they found him on the floor unresponsive. EMS was called at that time and the patient was brought to the ED. Patient continued to be unresponsive and required intubation. Patient currently intubated and on Diprovan. Patient has had a stroke in the past but reportedly lives alone and ambulates with a cane. Drags the left foot and is unable to use the left arm. Patient was not administered TPA secondary to delay in arrival. He was admitted to the neuro ICU for further evaluation and treatment.  SUBJECTIVE Patient extubated y`day and transferred to floor. Patient with spontaneous eye opening, L gaze deviation, non-verbal.  OBJECTIVE Most recent Vital Signs: Filed Vitals:   02/12/14 0123 02/12/14 0500 02/12/14 0526 02/12/14 1037  BP: 144/102  170/103 154/104  Pulse: 98  86 89  Temp: 98.5 F (36.9 C)  97.9 F (36.6 C) 98.5 F (36.9 C)  TempSrc: Axillary  Axillary Axillary  Resp: 16  16 18   Height:      Weight:  165 lb 12.6 oz (75.2 kg)    SpO2: 98%  100% 100%   CBG (last 3)   Recent Labs  02/11/14 1146 02/11/14 1541 02/11/14 1955  GLUCAP 135* 116* 110*    IV Fluid Intake:      MEDICATIONS  . amLODipine  10 mg Per Tube Daily  . antiseptic oral rinse  15 mL Mouth Rinse QID  . aspirin  300 mg Rectal Daily  . chlorhexidine  15 mL Mouth Rinse BID  . enoxaparin (LOVENOX) injection  40 mg Subcutaneous Q24H  . metoprolol  5 mg Intravenous 4 times per day  . pantoprazole (PROTONIX) IV  40 mg Intravenous Daily   PRN:  sodium chloride, acetaminophen (TYLENOL) oral liquid 160 mg/5 mL, hydrALAZINE, morphine injection  Diet:  NPO  Activity:  Bedrest DVT Prophylaxis:  Lovenox 40 mg sq daily   CLINICALLY SIGNIFICANT STUDIES Basic Metabolic Panel:   Recent Labs Lab  02/10/14 0450 02/10/14 0640 02/11/14 0615  NA 154*  --  154*  K 3.7  --  4.1  CL 111  --  112  CO2 26  --  28  GLUCOSE 162*  --  185*  BUN 34*  --  44*  CREATININE 1.69*  --  1.72*  CALCIUM 10.0  --  9.6  MG  --  2.1  --    Liver Function Tests:   Recent Labs Lab 02/06/14 1456  AST 169*  ALT 54*  ALKPHOS 89  BILITOT 1.2  PROT 8.4*  ALBUMIN 4.0   CBC:   Recent Labs Lab 02/06/14 1456  02/10/14 0450 02/11/14 0615  WBC 20.6*  < > 10.4 10.7*  NEUTROABS 17.0*  --   --   --   HGB 15.8  < > 13.1 12.6*  HCT 45.7  < > 39.9 39.0  MCV 90.5  < > 91.7 93.1  PLT 278  < > 203 213  < > = values in this interval not displayed. Coagulation:   Recent Labs Lab 02/06/14 1456  LABPROT 15.3*  INR 1.24   Cardiac Enzymes: No results found for this basename: CKTOTAL, CKMB, CKMBINDEX, TROPONINI,  in the last 168 hours Urinalysis:   Recent Labs Lab 02/06/14 [place] 6.5  GLUCOSEU NEGATIVE  HGBUR LARGE*  BILIRUBINUR NEGATIVE  KETONESUR NEGATIVE  PROTEINUR 30*  UROBILINOGEN 0.2  NITRITE NEGATIVE  LEUKOCYTESUR TRACE*   Lipid Panel    Component Value Date/Time   CHOL 122 02/08/2010 2316   TRIG 114 02/10/2014 0640   HDL 58 12/22/2013 1201   HDL 47 02/08/2010 2316   CHOLHDL 2.9 12/22/2013 1201   CHOLHDL 2.6 Ratio 02/08/2010 2316   VLDL 15 02/08/2010 2316   LDLCALC 99 12/22/2013 1201   LDLCALC 60 02/08/2010 2316   HgbA1C  No results found for this basename: HGBA1C    Urine Drug Screen:     Component Value Date/Time   LABOPIA NONE DETECTED 02/06/2014 1900   COCAINSCRNUR NONE DETECTED 02/06/2014 1900   LABBENZ NONE DETECTED 02/06/2014 1900   AMPHETMU NONE DETECTED 02/06/2014 1900   THCU POSITIVE* 02/06/2014 1900   LABBARB NONE DETECTED 02/06/2014 1900    Alcohol Level:   Recent Labs Lab 02/06/14 1456  ETH <11     CT Cervical Spine 02/06/2014    1. Spondylosis, without acute fracture or subluxation in the cervical spine. Straightening of  expected cervical lordosis could be positional, due to muscular spasm, or ligamentous injury.    CT of the brain  02/06/2014   Interval left posterior MCA distribution cortically based infarct, likely acute to subacute. Resultant mild mass effect with 5 mm of left-to-right midline shift.   MRI/MRA of the brain  02/07/2014    1. Large, acute left MCA territory infarct with 5 mm of rightward midline shift. Associated left MCA inferior division conclusion. 2. Remote, large right MCA territory infarct with unchanged appearance of right M2 branch vessel occlusion. 3. Chronic pontine perforator infarct, new from prior MRI. Remote right basal ganglia infarct.   Carotid Doppler  Bilateral: 1-39% ICA stenosis. Vertebral artery flow is antegrade.  Right side is poorly visualized due to IJ line and bilateral poorly visualized due to immobility.  TEE 02/09/2014 EF 55-60%, no source of embolus. Dilated sinus of valsalva and proximal aortic root with no disection seen Descending thoracic aorta also seems enlarged and impinges on LA There is significant spontaneous contrast in the descending thoracic aorta without debris/disection.  CXR   02/07/2014    Stable atelectasis/ consolidation of the left lower lobe.    02/06/2014   Tube and catheter positions as described without pneumothorax. Prominence of the aortic arch is probably indicative of chronic hypertensive change. There is mild atelectatic change in the left base. Elsewhere lungs are clear.    02/06/2014   Appropriate position of endotracheal tube.  Cardiomegaly without congestive failure.     EKG  sinus tachycardia, RBBB, PAC's noted, inferior infarct, old. For complete results please see formal report.   Therapy Recommendations   Physical Exam   Frail middle aged african [place] male   . Afebrile. Head is nontraumatic. Neck is supple without bruit.  . Cardiac exam no murmur or gallop. Lungs are clear to auscultation. Distal pulses are well felt. Neurological  Exam : Lethargic. Partially opens eyes to sternal rub. Left gaze deviation. Will not cross midline even with reflex eye movements. Pupils 2 mm small sluggishly reactive. Does not blink to threat bilaterally. Right lower facial weakness. Tongue midline. Purposeful antigravity movements on the right side. Withdraws left side to pain. Right plantar equivocal left downgoing.  ASSESSMENT Mr. William Collier is a 63 y.o. male found down unresponsive, required intubation. Imaging confirms a left MCA infarct in the setting of old right brain  infarct and cerebral edema with 68mm left-to-right shift.  Placed on 3% protocol (now off).  Infarct felt to be embolic secondary to unknown source. TEE completed without source found. On aspirin 325 mg orally every day prior to admission. Now on aspirin 300 mg suppository for secondary stroke prevention. Patient intubated, left gaze preference, pupils non-reactive. Stroke work up completed. Anticipate long-term neuro deficits with placement should he survive, poor quality of life. Patient to be extubated this am.   Acute respiratory failure, intubated in the ED Acute encephalopathy Elevated LFTs Induced hypernatremia with 3% saline protocol in order to decrease/prevent cerebral edema, drip off 02/07/2014, Na remains elevated at 154. Do not add fluids in order to decrease. 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  Resume statin once able to swallow if pt agreeable  Consider enteral tube feeding as patient unable to swallow and early PEG if family agreeable  SIGNED    Antony Contras, MD 02/12/2014 10:47 AM     To contact Stroke Continuity provider, please refer to [email or web address] After hours, contact General Neurology

## 2014-02-13 DIAGNOSIS — E860 Dehydration: Secondary | ICD-10-CM

## 2014-02-13 LAB — CULTURE, BLOOD (ROUTINE X 2): Culture: NO GROWTH

## 2014-02-13 LAB — HEMOGLOBIN A1C
HEMOGLOBIN A1C: 5.9 % — AB (ref <5.7)
Mean Plasma Glucose: 123 mg/dL — ABNORMAL HIGH (ref <117)

## 2014-02-13 MED ORDER — SODIUM CHLORIDE 0.9 % IV SOLN: 250.0000 mL | INTRAVENOUS | Status: DC | PRN

## 2014-02-13 MED ORDER — LORAZEPAM 2 MG/ML IJ SOLN: 1.0000 mg | INTRAMUSCULAR | Status: DC | PRN

## 2014-02-13 MED ORDER — ACETAMINOPHEN 650 MG RE SUPP
650.0000 mg | RECTAL | Status: DC | PRN
  Administered 2014-02-13: 650 mg via RECTAL
  Filled 2014-02-13: qty 1

## 2014-02-13 NOTE — Progress Notes (Signed)
[facility] PROGRESS NOTE  William Collier ZOX:[id number] DOB: 11-27-1950 DOA: 02/06/2014 PCP: Blanchie Serve, MD  D/w patient's family at the bedside, brother, sister, daughter, son on the phone Unfortunately Patient is declining fast, obtunded, large CVA with brain edema; multiple old CVA, remains at risk for complications, aspiration, sepsis;  -Family preferred comfort, hospice care  William Collier  [facility] Pager [phone]. If 7PM-7AM, please contact night-coverage at [email or web address], password [facility] 02/13/2014, 12:32 PM  LOS: 7 days

## 2014-02-13 NOTE — Progress Notes (Signed)
Patient is having difficulty breathing as he is having apneic moments and is clinching his teeth so unable to suction completely. Patient is also running a temperature of 101.6 called [facility] to obtain an order for Tylenol suppository and was told he ids not a [facility] patient. Will continue to find the Doctor who can help with the tylenol and continue to monitor patient.

## 2014-02-13 NOTE — Progress Notes (Signed)
Patient's son William Collier [phone] returned Clinical Social Worker's (CSW) call. Son is in agreement with residential hospice placement and prefers [facility]. Son is agreeable to [facility] as a second choice. Son gave daughter's correct phone number [phone]. CSW left message with patient's daughter. Son requested he be contacted first because it is sometimes difficult to get his sister on the phone. CSW will continue to follow and assist as needed.   SonKonrad Dolores Collier [phone] Daughter- William Collier [phone] Brother- William Collier- [phone]   William Collier, [place]

## 2014-02-13 NOTE — Progress Notes (Signed)
Stroke Team Progress Note  HISTORY William Collier is a 63 y.o. male who was last in contact with friends at about 1930 02/05/2014. Friends were unable to contact him on 02/06/2014 and went to his home where they found him on the floor unresponsive. EMS was called at that time and the patient was brought to the ED. Patient continued to be unresponsive and required intubation. Patient had a stroke in the past but reportedly lived alone and ambulated with a cane. Dragged the left foot and is unable to use the left arm. Patient was not administered TPA secondary to delay in arrival. He was admitted to the neuro ICU for further evaluation and treatment.  SUBJECTIVE No family members present. The patient responds only to painful stimuli. Condition has declined and is now not responsive and is getting septic   OBJECTIVE Most recent Vital Signs: Filed Vitals:   02/12/14 2127 02/13/14 0152 02/13/14 0500 02/13/14 0512  BP: 153/112 153/108  141/98  Pulse: 99 113  129  Temp: 96.8 F (36 C) 97.3 F (36.3 C)  101.6 F (38.7 C)  TempSrc: Axillary Axillary  Axillary  Resp: 18 20  32  Height:      Weight:   162 lb 0.6 oz (73.5 kg)   SpO2: 100% 100%  98%   CBG (last 3)   Recent Labs  02/11/14 1146 02/11/14 1541 02/11/14 1955  GLUCAP 135* 116* 110*    IV Fluid Intake:      MEDICATIONS  . amLODipine  10 mg Per Tube Daily  . antiseptic oral rinse  15 mL Mouth Rinse QID  . aspirin  300 mg Rectal Daily  . chlorhexidine  15 mL Mouth Rinse BID  . enoxaparin (LOVENOX) injection  40 mg Subcutaneous Q24H  . metoprolol  5 mg Intravenous 4 times per day  . pantoprazole (PROTONIX) IV  40 mg Intravenous Daily  . pneumococcal 13-valent conjugate vaccine  0.5 mL Intramuscular Tomorrow-1000   PRN:  sodium chloride, acetaminophen (TYLENOL) oral liquid 160 mg/5 mL, acetaminophen, hydrALAZINE, morphine injection  Diet:  NPO  Activity:  Bedrest DVT Prophylaxis:  Lovenox 40 mg sq daily   CLINICALLY  SIGNIFICANT STUDIES Basic Metabolic Panel:   Recent Labs Lab 02/10/14 0450 02/10/14 0640 02/11/14 0615  NA 154*  --  154*  K 3.7  --  4.1  CL 111  --  112  CO2 26  --  28  GLUCOSE 162*  --  185*  BUN 34*  --  44*  CREATININE 1.69*  --  1.72*  CALCIUM 10.0  --  9.6  MG  --  2.1  --    Liver Function Tests:   Recent Labs Lab 02/06/14 1456  AST 169*  ALT 54*  ALKPHOS 89  BILITOT 1.2  PROT 8.4*  ALBUMIN 4.0   CBC:   Recent Labs Lab 02/06/14 1456  02/10/14 0450 02/11/14 0615  WBC 20.6*  < > 10.4 10.7*  NEUTROABS 17.0*  --   --   --   HGB 15.8  < > 13.1 12.6*  HCT 45.7  < > 39.9 39.0  MCV 90.5  < > 91.7 93.1  PLT 278  < > 203 213  < > = values in this interval not displayed. Coagulation:   Recent Labs Lab 02/06/14 1456  LABPROT 15.3*  INR 1.24   Cardiac Enzymes: No results found for this basename: CKTOTAL, CKMB, CKMBINDEX, TROPONINI,  in the last 168 hours Urinalysis:   Recent Labs  Lab 02/06/14 1900  COLORURINE YELLOW  LABSPEC 1.013  PHURINE 6.5  GLUCOSEU NEGATIVE  HGBUR LARGE*  BILIRUBINUR NEGATIVE  KETONESUR NEGATIVE  PROTEINUR 30*  UROBILINOGEN 0.2  NITRITE NEGATIVE  LEUKOCYTESUR TRACE*   Lipid Panel    Component Value Date/Time   CHOL 122 02/08/2010 2316   TRIG 114 02/10/2014 0640   HDL 58 12/22/2013 1201   HDL 47 02/08/2010 2316   CHOLHDL 2.9 12/22/2013 1201   CHOLHDL 2.6 Ratio 02/08/2010 2316   VLDL 15 02/08/2010 2316   LDLCALC 99 12/22/2013 1201   LDLCALC 60 02/08/2010 2316   HgbA1C  No results found for this basename: HGBA1C    Urine Drug Screen:     Component Value Date/Time   LABOPIA NONE DETECTED 02/06/2014 1900   COCAINSCRNUR NONE DETECTED 02/06/2014 1900   LABBENZ NONE DETECTED 02/06/2014 1900   AMPHETMU NONE DETECTED 02/06/2014 1900   THCU POSITIVE* 02/06/2014 1900   LABBARB NONE DETECTED 02/06/2014 1900    Alcohol Level:   Recent Labs Lab 02/06/14 1456  ETH <11     CT Cervical Spine 02/06/2014    1. Spondylosis, without acute  fracture or subluxation in the cervical spine. Straightening of expected cervical lordosis could be positional, due to muscular spasm, or ligamentous injury.    CT of the brain  02/06/2014   Interval left posterior MCA distribution cortically based infarct, likely acute to subacute. Resultant mild mass effect with 5 mm of left-to-right midline shift.   MRI/MRA of the brain   02/07/2014     1. Large, acute left MCA territory infarct with 5 mm of rightward midline shift. Associated left MCA inferior division conclusion.  2. Remote, large right MCA territory infarct with unchanged appearance of right M2 branch vessel occlusion.  3. Chronic pontine perforator infarct, new from prior MRI. Remote right basal ganglia infarct.   Carotid Doppler  Bilateral: 1-39% ICA stenosis. Vertebral artery flow is antegrade.  Right side is poorly visualized due to IJ line and bilateral poorly visualized due to immobility.  TEE 02/09/2014 EF 55-60%, no source of embolus. Dilated sinus of valsalva and proximal aortic root with no disection seen Descending thoracic aorta also seems enlarged and impinges on LA There is significant spontaneous contrast in the descending thoracic aorta without debris/disection.  CXR   02/07/2014    Stable atelectasis/ consolidation of the left lower lobe.    02/06/2014   Tube and catheter positions as described without pneumothorax. Prominence of the aortic arch is probably indicative of chronic hypertensive change. There is mild atelectatic change in the left base. Elsewhere lungs are clear.    02/06/2014   Appropriate position of endotracheal tube.  Cardiomegaly without congestive failure.     EKG  sinus tachycardia, RBBB, PAC's noted, inferior infarct, old. For complete results please see formal report.   Therapy Recommendations - pending  Physical Exam   Frail middle aged african [place] male   . Afebrile. Head is nontraumatic. Neck is supple without bruit.  . Cardiac exam no murmur or  gallop. Lungs are clear to auscultation. Distal pulses are well felt. Neurological Exam : Lethargic. Not openeing eyes to sternal rub. Left gaze deviation. Will not cross midline even with reflex eye movements. Pupils 2 mm small sluggishly reactive. Does not blink to threat bilaterally. Right lower facial weakness. Tongue midline. Purposeful antigravity movements on the right side. Withdraws left side to pain. Right plantar equivocal left downgoing.  ASSESSMENT Mr. William Collier is a 63 y.o. male  found down unresponsive, required intubation. Imaging confirms a left MCA infarct in the setting of old right brain infarct and cerebral edema with 14mm left-to-right shift.  Placed on 3% protocol (now off).  Infarct felt to be embolic secondary to unknown source. TEE completed without source found. On aspirin 325 mg orally every day prior to admission. Now on aspirin 300 mg suppository for secondary stroke prevention. Patient intubated, left gaze preference, pupils non-reactive. Stroke work up completed. Anticipate long-term neuro deficits with placement should he survive, poor quality of life.  Acute respiratory failure - now extubated Acute encephalopathy Elevated LFTs Induced hypernatremia with 3% saline protocol in order to decrease/prevent cerebral edema, drip off 02/07/2014, Na remains elevated at 154. Do not add fluids in order to decrease. Central line removed. 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 Family aware of need for palliative care vs feeding tube if patient  not able to swallow.  Continue aspirin 300 mg suppository daily for secondary stroke prevention. Resume statin once able to swallow if pt agreeable  NPO - Consider enteral tube feeding as patient unable to swallow and early PEG if family agreeable Await therapy evaluations. Hemoglobin A1c pending  SIGNED Mikey Bussing PA-C [facility] Pager [phone] 02/13/2014, 9:48 AM   I have personally examined this patient, reviewed chart and pertinent data can agree with above Antony Contras, MD  To contact Stroke Continuity provider, please refer to [email or web address] After hours, contact General Neurology

## 2014-02-13 NOTE — Progress Notes (Addendum)
Clinical Social Work Department BRIEF PSYCHOSOCIAL ASSESSMENT 02/13/2014  Patient:  [facility] A     Account Number:  [id number]     Admit date:  02/06/2014  Clinical Social Worker:  Rolinda Roan  Date/Time:  02/13/2014 04:09 PM  Referred by:  Physician  Date Referred:  02/13/2014 Referred for  Residential hospice placement   Other Referral:   Interview type:  Family Other interview type:    PSYCHOSOCIAL DATA Living Status:  ALONE Admitted from facility:   Level of care:   Primary support name:  Osaze Hubbert [phone] Primary support relationship to patient:  SIBLING Degree of support available:   Good support.    CURRENT CONCERNS  Other Concerns:    SOCIAL WORK ASSESSMENT / PLAN Family was not at bedside and per chart patient is not alert and oriented. Clinical Education officer, museum (CSW) attempted to contact patient's daughter listed on the facesheet it was not a working number. CSW contacted patient's brother who reported that the family wants comfort care for patient and for him to go to a residential hospice facility. Brother reported  [facility] is the family's first choice. CSW explained that [facility] is the only facility in [place] and it usually stays full. Brother gave CSW permission to send referral to [facility] as a second option. CSW also left message with patient's son listed on the facesheet.     CSW made referral to [facility] and [facility].   Assessment/plan status:  Psychosocial Support/Ongoing Assessment of Needs Other assessment/ plan:   Information/referral to community resources:    PATIENT'S/FAMILY'S RESPONSE TO PLAN OF CARE: Brother thanked CSW for calling and assisting with placement process. Brother reported that he would call CSW back and give correct phone number for patient's daughter.

## 2014-02-13 NOTE — Progress Notes (Signed)
[facility] PROGRESS NOTE  William Collier EQA:[id number] DOB: 07-03-1951 DOA: 02/06/2014 PCP: Blanchie Serve, MD  Assessment/Plan: 63 y/o male with PMH of HT, HPL, GERD, Bladder CA, CKD, h/o CVA was brought to the emergency department at [facility] on 02/06/2014 after he had been found unresponsive intubated, found to have evidence of a subacute left posterior MCA distribution stroke.   1. Acute CVA  MRI: Large, acute left MCA territory infarct with 5 mm of rightward midline shift. Associated left MCA inferior division conclusion.  Remote, large right MCA territory infarct with unchanged appearance of right M2 branch vessel occlusion. Chronic pontine perforator infarct, new from prior MRI. Remote right basal ganglia infarct. -patient is obtunded; very poor prognosis; will d/w family about hospice care   2. Fever/sirs/possibt developing pneumonia; at risk for recurrent aspiration  3. Acute encephalopathy  In the setting of acute stroke, hyper Na, sepsis; Acute L MCA stroke with shift -very poor prognosis; stroke care per neurology 4. Acute respiratory failure due to inability to protect airway  -S/p extubation 6/12; at risk of recurrent aspiration;  5. Hypertension in setting of acute stroke  -resumed metoprolol & amlodipine; hydralazine prn 6. CKD, dehydration, Hyper K, hyper Na; monitor lytes; gentle IVF; monitor   Pt is already DNR; very poor prognosis, patient obtunded -family meeting today, likely hospice care if agreed    Code Status: DNR Family Communication: awaiting family meeting  (indicate person spoken with, relationship, and if by phone, the number) Disposition Plan: pend family meeting/decision     Consultants:  Neurology   SIGNIFICANT EVENTS / STUDIES:  June 7 CT c-spine> spondylosis  June 7 CT head> interval left posterior MCA distribution acute to subacute infarct, 5 mm left-to-right midline shift; remote R MCA stroke  6/8 MRI - Large, acute left MCA territory  infarct with 5 mm of rightward midline shift. Associated left MCA inferior division conclusion.  Remote, large right MCA territory infarct .Chronic pontine perforator infarct, new from prior MRI. Remote right basal ganglia infarct  6/12 one way extubation  LINES / TUBES:  02/06/2014 ETtube> 6/12  6/7 RIJ CVL>>  CULTURES:  02/06/2014 blood culture> ng  ANTIBIOTICS:  none  SUBJECTIVE:  On 4N, Not protecting airway well   HPI/Subjective: Somnolent, lethargic   Objective: Filed Vitals:   02/13/14 1007  BP: 122/87  Pulse: 110  Temp: 98.1 F (36.7 C)  Resp: 26    Intake/Output Summary (Last 24 hours) at 02/13/14 1016 Last data filed at 02/13/14 0521  Gross per 24 hour  Intake      0 ml  Output   1825 ml  Net  -1825 ml   Filed Weights   02/11/14 0420 02/12/14 0500 02/13/14 0500  Weight: 73.2 kg (161 lb 6 oz) 75.2 kg (165 lb 12.6 oz) 73.5 kg (162 lb 0.6 oz)    Exam:   General:  Obtunded   Cardiovascular: s1,s2 rrr  Respiratory: BL rales   Abdomen: soft, nt,nd no  Musculoskeletal: no LE edema   Data Reviewed: Basic Metabolic Panel:  Recent Labs Lab 02/07/14 0520 02/08/14 0400 02/09/14 0500 02/10/14 0450 02/10/14 0640 02/11/14 0615  NA 154* 152* 153* 154*  --  154*  K 3.9 4.0 3.8 3.7  --  4.1  CL 119* 112 113* 111  --  112  CO2 24 24 25 26   --  28  GLUCOSE 123* 104* 162* 162*  --  185*  BUN 26* 28* 28* 34*  --  44*  CREATININE 1.76* 1.74* 1.57* 1.69*  --  1.72*  CALCIUM 8.9 9.1 9.4 10.0  --  9.6  MG  --   --   --   --  2.1  --    Liver Function Tests:  Recent Labs Lab 02/06/14 1456  AST 169*  ALT 54*  ALKPHOS 89  BILITOT 1.2  PROT 8.4*  ALBUMIN 4.0   No results found for this basename: LIPASE, AMYLASE,  in the last 168 hours No results found for this basename: AMMONIA,  in the last 168 hours CBC:  Recent Labs Lab 02/06/14 1456  02/07/14 0520 02/08/14 0400 02/09/14 0500 02/10/14 0450 02/11/14 0615  WBC 20.6*  --  13.6* 11.9* 10.0  10.4 10.7*  NEUTROABS 17.0*  --   --   --   --   --   --   HGB 15.8  < > 12.3* 12.7* 13.0 13.1 12.6*  HCT 45.7  < > 36.8* 38.1* 39.1 39.9 39.0  MCV 90.5  --  90.2 91.4 90.7 91.7 93.1  PLT 278  --  203 202 202 203 213  < > = values in this interval not displayed. Cardiac Enzymes: No results found for this basename: CKTOTAL, CKMB, CKMBINDEX, TROPONINI,  in the last 168 hours BNP (last 3 results) No results found for this basename: PROBNP,  in the last 8760 hours CBG:  Recent Labs Lab 02/11/14 0420 02/11/14 0833 02/11/14 1146 02/11/14 1541 02/11/14 1955  GLUCAP 195* 127* 135* 116* 110*    Recent Results (from the past 240 hour(s))  CULTURE, BLOOD (ROUTINE X 2)     Status: None   Collection Time    02/06/14  2:47 PM      Result Value Ref Range Status   Specimen Description BLOOD RIGHT WRIST   Final   Special Requests BOTTLES DRAWN AEROBIC ONLY 3CC   Final   Culture  Setup Time     Final   Value: 02/06/2014 22:47     Performed at [facility]   Culture     Final   Value: NO GROWTH 5 DAYS     Performed at [facility]   Report Status 02/12/2014 FINAL   Final  MRSA PCR SCREENING     Status: None   Collection Time    02/06/14  6:49 PM      Result Value Ref Range Status   MRSA by PCR NEGATIVE  NEGATIVE Final   Comment:            The GeneXpert MRSA Assay (FDA     approved for NASAL specimens     only), is one component of a     comprehensive MRSA colonization     surveillance program. It is not     intended to diagnose MRSA     infection nor to guide or     monitor treatment for     MRSA infections.  CULTURE, BLOOD (ROUTINE X 2)     Status: None   Collection Time    02/06/14  7:34 PM      Result Value Ref Range Status   Specimen Description BLOOD RIGHT WRIST   Final   Special Requests BOTTLES DRAWN AEROBIC ONLY 5CC   Final   Culture  Setup Time     Final   Value: 02/07/2014 02:00     Performed at [facility]   Culture     Final   Value:  BLOOD CULTURE RECEIVED NO GROWTH TO DATE CULTURE WILL BE HELD FOR 5 DAYS BEFORE ISSUING A FINAL NEGATIVE REPORT     Performed at [facility]   Report Status PENDING   Incomplete     Studies: No results found.  Scheduled Meds: . amLODipine  10 mg Per Tube Daily  . antiseptic oral rinse  15 mL Mouth Rinse QID  . aspirin  300 mg Rectal Daily  . chlorhexidine  15 mL Mouth Rinse BID  . enoxaparin (LOVENOX) injection  40 mg Subcutaneous Q24H  . metoprolol  5 mg Intravenous 4 times per day  . pantoprazole (PROTONIX) IV  40 mg Intravenous Daily   Continuous Infusions:   Principal Problem:   Acute ischemic stroke Active Problems:   HYPERTENSION   CKD (chronic kidney disease) stage 3, GFR 30-59 ml/min   Acute encephalopathy   Acute respiratory failure    Time spent: >35 minutes     Kinnie Feil  [facility] Pager [phone]. If 7PM-7AM, please contact night-coverage at [email or web address], password [facility] 02/13/2014, 10:16 AM  LOS: 7 days

## 2014-02-13 NOTE — Progress Notes (Signed)
Just received order for tylenol suppository and administered it will continue to monitor temp.

## 2014-02-14 DIAGNOSIS — I10 Essential (primary) hypertension: Secondary | ICD-10-CM

## 2014-02-14 MED ORDER — MORPHINE SULFATE (CONCENTRATE) 10 MG /0.5 ML PO SOLN: 5.0000 mg | ORAL | Status: AC | PRN

## 2014-02-14 MED ORDER — SCOPOLAMINE 1 MG/3DAYS TD PT72: 1.0000 | MEDICATED_PATCH | TRANSDERMAL | Status: AC

## 2014-02-14 MED ORDER — LORAZEPAM 1 MG PO TABS: 1.0000 mg | ORAL_TABLET | Freq: Three times a day (TID) | ORAL | Status: AC

## 2014-02-14 NOTE — Progress Notes (Signed)
Stroke Team Progress Note  HISTORY William Collier is a 63 y.o. male who was last in contact with friends at about 1930 02/05/2014. Friends were unable to contact him on 02/06/2014 and went to his home where they found him on the floor unresponsive. EMS was called at that time and the patient was brought to the ED. Patient continued to be unresponsive and required intubation. Patient had a stroke in the past but reportedly lived alone and ambulated with a cane. Dragged the left foot and is unable to use the left arm. Patient was not administered TPA secondary to delay in arrival. He was admitted to the neuro ICU for further evaluation and treatment.  SUBJECTIVE No family members present. The patient responds only to painful stimuli. Irregular and labored breathing.   OBJECTIVE Most recent Vital Signs: Filed Vitals:   02/13/14 0500 02/13/14 0512 02/13/14 1007 02/14/14 0540  BP:  141/98 122/87 146/105  Pulse:  129 110 124  Temp:  101.6 F (38.7 C) 98.1 F (36.7 C) 99.1 F (37.3 C)  TempSrc:  Axillary Rectal Axillary  Resp:  32 26 28  Height:      Weight: 73.5 kg (162 lb 0.6 oz)     SpO2:  98% 93% 96%   CBG (last 3)   Recent Labs  02/11/14 1541 02/11/14 1955  GLUCAP 116* 110*    IV Fluid Intake:      MEDICATIONS  . antiseptic oral rinse  15 mL Mouth Rinse QID   PRN:  acetaminophen, LORazepam, morphine injection  Diet:  NPO  Activity:  Bedrest DVT Prophylaxis:  Lovenox 40 mg sq daily   CLINICALLY SIGNIFICANT STUDIES Basic Metabolic Panel:   Recent Labs Lab 02/10/14 0450 02/10/14 0640 02/11/14 0615  NA 154*  --  154*  K 3.7  --  4.1  CL 111  --  112  CO2 26  --  28  GLUCOSE 162*  --  185*  BUN 34*  --  44*  CREATININE 1.69*  --  1.72*  CALCIUM 10.0  --  9.6  MG  --  2.1  --    Liver Function Tests:  No results found for this basename: AST, ALT, ALKPHOS, BILITOT, PROT, ALBUMIN,  in the last 168 hours CBC:   Recent Labs Lab 02/10/14 0450 02/11/14 0615  WBC  10.4 10.7*  HGB 13.1 12.6*  HCT 39.9 39.0  MCV 91.7 93.1  PLT 203 213   Coagulation:  No results found for this basename: LABPROT, INR,  in the last 168 hours Cardiac Enzymes: No results found for this basename: CKTOTAL, CKMB, CKMBINDEX, TROPONINI,  in the last 168 hours Urinalysis:  No results found for this basename: COLORURINE, APPERANCEUR, LABSPEC, PHURINE, GLUCOSEU, HGBUR, BILIRUBINUR, KETONESUR, PROTEINUR, UROBILINOGEN, NITRITE, LEUKOCYTESUR,  in the last 168 hours Lipid Panel    Component Value Date/Time   CHOL 122 02/08/2010 2316   TRIG 114 02/10/2014 0640   HDL 58 12/22/2013 1201   HDL 47 02/08/2010 2316   CHOLHDL 2.9 12/22/2013 1201   CHOLHDL 2.6 Ratio 02/08/2010 2316   VLDL 15 02/08/2010 2316   LDLCALC 99 12/22/2013 1201   LDLCALC 60 02/08/2010 2316   HgbA1C  Lab Results  Component Value Date   HGBA1C 5.9* 02/13/2014    Urine Drug Screen:     Component Value Date/Time   LABOPIA NONE DETECTED 02/06/2014 1900   COCAINSCRNUR NONE DETECTED 02/06/2014 1900   LABBENZ NONE DETECTED 02/06/2014 1900   AMPHETMU NONE DETECTED 02/06/2014  [place] 02/06/2014 1900   LABBARB NONE DETECTED 02/06/2014 1900    Alcohol Level:  No results found for this basename: ETH,  in the last 168 hours   CT Cervical Spine 02/06/2014    1. Spondylosis, without acute fracture or subluxation in the cervical spine. Straightening of expected cervical lordosis could be positional, due to muscular spasm, or ligamentous injury.    CT of the brain  02/06/2014   Interval left posterior MCA distribution cortically based infarct, likely acute to subacute. Resultant mild mass effect with 5 mm of left-to-right midline shift.   MRI/MRA of the brain   02/07/2014     1. Large, acute left MCA territory infarct with 5 mm of rightward midline shift. Associated left MCA inferior division conclusion.  2. Remote, large right MCA territory infarct with unchanged appearance of right M2 branch vessel occlusion.  3. Chronic pontine  perforator infarct, new from prior MRI. Remote right basal ganglia infarct.   Carotid Doppler  Bilateral: 1-39% ICA stenosis. Vertebral artery flow is antegrade.  Right side is poorly visualized due to IJ line and bilateral poorly visualized due to immobility.  TEE 02/09/2014 EF 55-60%, no source of embolus. Dilated sinus of valsalva and proximal aortic root with no disection seen Descending thoracic aorta also seems enlarged and impinges on LA There is significant spontaneous contrast in the descending thoracic aorta without debris/disection.  CXR   02/07/2014    Stable atelectasis/ consolidation of the left lower lobe.    02/06/2014   Tube and catheter positions as described without pneumothorax. Prominence of the aortic arch is probably indicative of chronic hypertensive change. There is mild atelectatic change in the left base. Elsewhere lungs are clear.    02/06/2014   Appropriate position of endotracheal tube.  Cardiomegaly without congestive failure.     EKG  sinus tachycardia, RBBB, PAC's noted, inferior infarct, old. For complete results please see formal report.   Therapy Recommendations - patient to go to Hospice  Physical Exam   Frail middle aged african [place] male. Afebrile. Head is nontraumatic. Neck is supple without bruit.  Cardiac exam no murmur or gallop. Lungs are clear to auscultation. Distal pulses are well felt. Neurological Exam : Lethargic. Not opening eyes to sternal rub. Left gaze deviation. Will not cross midline even with reflex eye movements. Pupils 2 mm small sluggishly reactive. Does not blink to threat bilaterally. Right lower facial weakness. Tongue midline. Purposeful antigravity movements on the right side. Withdraws left side to pain. Right plantar equivocal left downgoing.  ASSESSMENT William Collier is a 63 y.o. male found down unresponsive, required intubation. Imaging confirms a left MCA infarct in the setting of old right brain infarct and cerebral edema  with 48mm left-to-right shift.  Placed on 3% protocol (now off).  Infarct felt to be embolic secondary to unknown source. TEE completed without source found. On aspirin 325 mg orally every day prior to admission. Now on aspirin 300 mg suppository for secondary stroke prevention. Patient with left gaze deviation, pupils non-reactive. Stroke work up completed. Anticipate long-term neuro deficits with placement should he survive, poor quality of life.  Acute respiratory failure - now extubated Acute encephalopathy Elevated LFTs Induced hypernatremia with 3% saline protocol in order to decrease/prevent cerebral edema, drip off 02/07/2014, Na remains elevated at 154. Do not add fluids in order to decrease. Central line removed. 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 After hours, contact General Neurology

## 2014-02-14 NOTE — Progress Notes (Signed)
Chaplain responded to request from nurse to speak with family who wants to talk to chaplain. Pt is unresponsive following stroke. Visited with pt's sister. She says doctor expects him to live only 2-3 days and that pt will be going somewhere for hospice care. She awaits word as to where. Pt's sister expressed concern about her brother's relationship with God. We shared empathic conversation with her and had prayer together commending pt to [facility] care.

## 2014-02-14 NOTE — Progress Notes (Signed)
Patient discharged to Hospice with sister at bedside.Patient is unresponsive but stroke education and handout given to family for further understanding of stroke. Report was given to receiving nurse. Aisha Rn

## 2014-02-14 NOTE — Discharge Summary (Addendum)
Physician Discharge Summary  William Collier IZT:[id number] DOB: [birth year] DOA: 02/06/2014  PCP: Blanchie Serve, MD  Admit date: 02/06/2014 Discharge date: 02/14/2014  Time spent: >35 minutes  Recommendations for Outpatient Follow-up:  Hospice   Discharge Diagnoses:  Principal Problem:   Acute ischemic stroke Active Problems:   HYPERTENSION   CKD (chronic kidney disease) stage 3, GFR 30-59 ml/min   Acute encephalopathy   Acute respiratory failure   Discharge Condition: poor   Diet recommendation: comfort   Filed Weights   02/11/14 0420 02/12/14 0500 02/13/14 0500  Weight: 73.2 kg (161 lb 6 oz) 75.2 kg (165 lb 12.6 oz) 73.5 kg (162 lb 0.6 oz)    History of present illness:  63 y/o male with PMH of HT, HPL, GERD, Bladder CA, CKD, h/o CVA was brought to the emergency department at [facility] on 02/06/2014 after he had been found unresponsive intubated, found to have evidence of a subacute left posterior MCA distribution stroke.   Hospital Course:  1. Acute CVA MRI: Large, acute left MCA territory infarct with 5 mm of rightward midline shift. Associated left MCA inferior division conclusion. Remote, large right MCA territory infarct with unchanged appearance of right M2 branch vessel occlusion. Chronic pontine perforator infarct, new from prior MRI. Remote right basal ganglia infarct.  -patient is obtunded; very poor prognosis; will d/w family about hospice care  2. Fever/sirs/possibt developing pneumonia; at risk for recurrent aspiration  3. Acute encephalopathy In the setting of acute stroke, hyper Na, sepsis; Acute L MCA stroke with shift  -very poor prognosis; stroke care per neurology  4. Acute respiratory failure due to inability to protect airway  -S/p extubation 6/12; at risk of recurrent aspiration;  5. Hypertension in setting of acute stroke  6. CKD, dehydration, Hyper K, hyper Na; monitor lytes; gentle IVF; monitor  Pt is already DNR; very poor prognosis, patient  obtunded     Unfortunately Patient is declining fast, obtunded, large CVA with brain edema; multiple old CVA, remains at risk for complications, aspiration, sepsis;  -Family preferred comfort, hospice care    Procedures:  noen (i.e. Studies not automatically included, echos, thoracentesis, etc; not x-rays)  Consultations:  Neurology   Discharge Exam: Filed Vitals:   02/14/14 0540  BP: 146/105  Pulse: 124  Temp: 99.1 F (37.3 C)  Resp: 28    General: obtunded Cardiovascular: s1,s2 rrr Respiratory: BL rales   Discharge Instructions  Discharge Instructions   Diet - low sodium heart healthy    Complete by:  As directed      Discharge instructions    Complete by:  As directed   F/u with hospice care     Increase activity slowly    Complete by:  As directed             Medication List    STOP taking these medications       amLODipine 5 MG tablet  Commonly known as:  NORVASC     aspirin 325 MG tablet     DAILY MULTI Tabs     metoprolol tartrate 25 MG tablet  Commonly known as:  LOPRESSOR     omeprazole 20 MG capsule  Commonly known as:  PRILOSEC     OxyCODONE HCl (Abuse Deter) 5 MG Taba     pravastatin 40 MG tablet  Commonly known as:  PRAVACHOL      TAKE these medications       LORazepam 1 MG tablet  Commonly known as:  ATIVAN  Take 1 tablet (1 mg total) by mouth every 8 (eight) hours.     morphine CONCENTRATE 10 mg / 0.5 ml concentrated solution  Take 0.25 mLs (5 mg total) by mouth every 3 (three) hours as needed for severe pain.     scopolamine 1 MG/3DAYS  Commonly known as:  TRANSDERM-SCOP  Place 1 patch (1.5 mg total) onto the skin every 3 (three) days.       Allergies  Allergen Reactions  . Penicillins Other (See Comments)    HAD WHEN WAS A CHILD   . Pregabalin Swelling      The results of significant diagnostics from this hospitalization (including imaging, microbiology, ancillary and laboratory) are listed below for  reference.    Significant Diagnostic Studies: Ct Head Wo Contrast  02/06/2014   CLINICAL DATA:  Found unresponsive. Last normal last night at 1930 hr. Hypertension. Bladder cancer. Prior stroke 08/20/2011 and 2008 with left-sided weakness. Chronic kidney disease.  EXAM: CT HEAD WITHOUT CONTRAST  CT CERVICAL SPINE WITHOUT CONTRAST  TECHNIQUE: Multidetector CT imaging of the head and cervical spine was performed following the standard protocol without intravenous contrast. Multiplanar CT image reconstructions of the cervical spine were also generated.  COMPARISON:  Brain MR of 04/13/2006 and CT of 04/13/2006.  FINDINGS: CT HEAD FINDINGS  Sinuses/Soft tissues: Left frontoparietal scalp soft tissue swelling. Left maxillary sinus mucosal thickening. Ethmoid air cell and sphenoid sinus mucosal thickening. No skull fracture. Clear mastoid air cells.  Intracranial: Remote right MCA infarct with resultant ex vacuo dilatation of the right lateral ventricle.  Interval cortically based left MCA distribution infarct, likely acute to subacute. 5 mm of left-to-right midline shift. Basal cisterns patent. No complicating hemorrhage, intra-axial, or extra-axial fluid collection  CT CERVICAL SPINE FINDINGS  The lateral view images through the bottom of T1. Prevertebral soft tissues not well evaluated, secondary to endotracheal tube in place. Fluid in the sphenoid sinus and oral pharynx, is likely secondary to endotracheal tube. Dense left carotid atherosclerosis. No apical pneumothorax.  Spondylosis with areas of central canal and neural foraminal narrowing. Most advanced at C3-4 and C4-5.  Skull base intact. Maintenance of vertebral body height. Straightening of expected lordosis. Facets are well-aligned.  IMPRESSION: 1. Spondylosis, without acute fracture or subluxation in the cervical spine. Straightening of expected cervical lordosis could be positional, due to muscular spasm, or ligamentous injury. 2. Interval left posterior  MCA distribution cortically based infarct, likely acute to subacute. Resultant mild mass effect with 5 mm of left-to-right midline shift. Critical test results telephoned to Dr. Jeanell Sparrow. at the time of interpretation at . 419 p.m.on . 02/06/2014. 3. Remote right MCA distribution infarct. 4. Sinus disease. 5. Left sided scalp soft tissue swelling.   Electronically Signed   By: Abigail Miyamoto M.D.   On: 02/06/2014 16:20   Ct Cervical Spine Wo Contrast  02/06/2014   CLINICAL DATA:  Found unresponsive. Last normal last night at 1930 hr. Hypertension. Bladder cancer. Prior stroke 08/20/2011 and 2008 with left-sided weakness. Chronic kidney disease.  EXAM: CT HEAD WITHOUT CONTRAST  CT CERVICAL SPINE WITHOUT CONTRAST  TECHNIQUE: Multidetector CT imaging of the head and cervical spine was performed following the standard protocol without intravenous contrast. Multiplanar CT image reconstructions of the cervical spine were also generated.  COMPARISON:  Brain MR of 04/13/2006 and CT of 04/13/2006.  FINDINGS: CT HEAD FINDINGS  Sinuses/Soft tissues: Left frontoparietal scalp soft tissue swelling. Left maxillary sinus mucosal thickening. Ethmoid air cell and sphenoid sinus mucosal  thickening. No skull fracture. Clear mastoid air cells.  Intracranial: Remote right MCA infarct with resultant ex vacuo dilatation of the right lateral ventricle.  Interval cortically based left MCA distribution infarct, likely acute to subacute. 5 mm of left-to-right midline shift. Basal cisterns patent. No complicating hemorrhage, intra-axial, or extra-axial fluid collection  CT CERVICAL SPINE FINDINGS  The lateral view images through the bottom of T1. Prevertebral soft tissues not well evaluated, secondary to endotracheal tube in place. Fluid in the sphenoid sinus and oral pharynx, is likely secondary to endotracheal tube. Dense left carotid atherosclerosis. No apical pneumothorax.  Spondylosis with areas of central canal and neural foraminal narrowing.  Most advanced at C3-4 and C4-5.  Skull base intact. Maintenance of vertebral body height. Straightening of expected lordosis. Facets are well-aligned.  IMPRESSION: 1. Spondylosis, without acute fracture or subluxation in the cervical spine. Straightening of expected cervical lordosis could be positional, due to muscular spasm, or ligamentous injury. 2. Interval left posterior MCA distribution cortically based infarct, likely acute to subacute. Resultant mild mass effect with 5 mm of left-to-right midline shift. Critical test results telephoned to Dr. Jeanell Sparrow. at the time of interpretation at . 419 p.m.on . 02/06/2014. 3. Remote right MCA distribution infarct. 4. Sinus disease. 5. Left sided scalp soft tissue swelling.   Electronically Signed   By: Abigail Miyamoto M.D.   On: 02/06/2014 16:20   Mr Jodene Nam Head Wo Contrast  02/07/2014   CLINICAL DATA:  Found down, unresponsive.  Evaluate for stroke.  EXAM: MRI HEAD WITHOUT CONTRAST  MRA HEAD WITHOUT CONTRAST  TECHNIQUE: Multiplanar, multiecho pulse sequences of the brain and surrounding structures were obtained without intravenous contrast. Angiographic images of the head were obtained using MRA technique without contrast.  COMPARISON:  Head CT 02/06/2014 and MRI/ MRA 04/13/2006  FINDINGS: MRI HEAD FINDINGS  There is a large, acute left MCA territory infarct involving much of the left temporal and parietal lobes as well as the lateral aspect of the occipital lobe and posterior left frontal lobe and insula. There is no evidence of associated hemorrhage.  Encephalomalacia in the right frontal and parietal lobes related to remote, large right MCA territory infarct is present with a small amount of old blood products and associated wallerian degeneration. There is ex vacuo dilatation of the right lateral ventricle. Edema from the acute left MCA infarct results in 5 mm of rightward midline shift and partial effacement of the left lateral ventricle. Remote right basal ganglia infarct  is again seen. There is a small, chronic perforator infarct in the pons, new from prior MRI. There is mild cerebral atrophy. There is no extra-axial fluid collection.  Orbits are unremarkable. There is mild-to-moderate right greater than left sphenoid sinus mucosal thickening. Mild bilateral maxillary sinus mucosal thickening is noted. Small amount of fluid is present in the nasopharynx. Mastoid air cells are clear.  MRA HEAD FINDINGS  The visualized distal vertebral arteries are patent with the right being dominant. PICA origins appear patent. Right AICA origin appears patent. Left AICA origin is not well evaluated. SCA origins are patent. Basilar artery is patent without stenosis. Mild bilateral PCA irregularity may be artifactual. No proximal PCA stenosis is seen. Posterior communicating arteries are not clearly identified.  Visualized distal cervical and intracranial internal carotid artery on the right is diffusely small in caliber compared to the left, similar to the prior study and suggestive of proximal stenosis. Signal dropout in the carotid siphons bilaterally is felt to be artifactual. Proximal M2 branch  occlusion on the right with diminished number of right MCA sylvian vessels does not appear significantly changed from the prior MRA. There is new occlusion of the inferior M2 division on the left corresponding to the region of acute infarct. There also appears to be occlusion of the anterior temporal branch proximally on the left. The right A1 segment is hypoplastic. ACAs are otherwise unremarkable.  IMPRESSION: 1. Large, acute left MCA territory infarct with 5 mm of rightward midline shift. Associated left MCA inferior division conclusion. 2. Remote, large right MCA territory infarct with unchanged appearance of right M2 branch vessel occlusion. 3. Chronic pontine perforator infarct, new from prior MRI. Remote right basal ganglia infarct. These results will be called to the ordering clinician or  representative by the Radiologist Assistant, and communication documented in the PACS or zVision Dashboard.   Electronically Signed   By: Logan Bores   On: 02/07/2014 16:52   Mr Brain Wo Contrast  02/07/2014   CLINICAL DATA:  Found down, unresponsive.  Evaluate for stroke.  EXAM: MRI HEAD WITHOUT CONTRAST  MRA HEAD WITHOUT CONTRAST  TECHNIQUE: Multiplanar, multiecho pulse sequences of the brain and surrounding structures were obtained without intravenous contrast. Angiographic images of the head were obtained using MRA technique without contrast.  COMPARISON:  Head CT 02/06/2014 and MRI/ MRA 04/13/2006  FINDINGS: MRI HEAD FINDINGS  There is a large, acute left MCA territory infarct involving much of the left temporal and parietal lobes as well as the lateral aspect of the occipital lobe and posterior left frontal lobe and insula. There is no evidence of associated hemorrhage.  Encephalomalacia in the right frontal and parietal lobes related to remote, large right MCA territory infarct is present with a small amount of old blood products and associated wallerian degeneration. There is ex vacuo dilatation of the right lateral ventricle. Edema from the acute left MCA infarct results in 5 mm of rightward midline shift and partial effacement of the left lateral ventricle. Remote right basal ganglia infarct is again seen. There is a small, chronic perforator infarct in the pons, new from prior MRI. There is mild cerebral atrophy. There is no extra-axial fluid collection.  Orbits are unremarkable. There is mild-to-moderate right greater than left sphenoid sinus mucosal thickening. Mild bilateral maxillary sinus mucosal thickening is noted. Small amount of fluid is present in the nasopharynx. Mastoid air cells are clear.  MRA HEAD FINDINGS  The visualized distal vertebral arteries are patent with the right being dominant. PICA origins appear patent. Right AICA origin appears patent. Left AICA origin is not well  evaluated. SCA origins are patent. Basilar artery is patent without stenosis. Mild bilateral PCA irregularity may be artifactual. No proximal PCA stenosis is seen. Posterior communicating arteries are not clearly identified.  Visualized distal cervical and intracranial internal carotid artery on the right is diffusely small in caliber compared to the left, similar to the prior study and suggestive of proximal stenosis. Signal dropout in the carotid siphons bilaterally is felt to be artifactual. Proximal M2 branch occlusion on the right with diminished number of right MCA sylvian vessels does not appear significantly changed from the prior MRA. There is new occlusion of the inferior M2 division on the left corresponding to the region of acute infarct. There also appears to be occlusion of the anterior temporal branch proximally on the left. The right A1 segment is hypoplastic. ACAs are otherwise unremarkable.  IMPRESSION: 1. Large, acute left MCA territory infarct with 5 mm of rightward midline shift.  Associated left MCA inferior division conclusion. 2. Remote, large right MCA territory infarct with unchanged appearance of right M2 branch vessel occlusion. 3. Chronic pontine perforator infarct, new from prior MRI. Remote right basal ganglia infarct. These results will be called to the ordering clinician or representative by the Radiologist Assistant, and communication documented in the PACS or zVision Dashboard.   Electronically Signed   By: Logan Bores   On: 02/07/2014 16:52   Dg Chest Port 1 View  02/11/2014   CLINICAL DATA:  Endotracheal tube placement  EXAM: PORTABLE CHEST - 1 VIEW  COMPARISON:  02/09/2014  FINDINGS: Cardiomediastinal silhouette is stable. Endotracheal tube in place with tip 3.1 cm above the carinal. Stable right IJ central line position with tip in SVC. Stable NG tube position. No acute infiltrate or pulmonary edema. No pneumothorax. Mild left basilar atelectasis.  IMPRESSION: Stable support  apparatus. No acute infiltrate or pulmonary edema. Mild left basilar atelectasis.   Electronically Signed   By: Lahoma Crocker M.D.   On: 02/11/2014 08:04   Dg Chest Port 1 View  02/09/2014   CLINICAL DATA:  Atelectasis.  EXAM: PORTABLE CHEST - 1 VIEW  COMPARISON:  02/08/2014.  FINDINGS: Endotracheal tube, central line, NG tube in stable position. Mediastinum and hilar structures normal. Stable cardiomegaly with normal pulmonary vascularity. No pleural effusion or pneumothorax interim resolution of left lower lobe atelectatic changes. No acute bony abnormality. Surgical screw over left shoulder.  IMPRESSION: 1. Stable line and tube positions. 2. Interim resolution of left lower lobe atelectatic changes. 3. Stable cardiomegaly, no CHF.   Electronically Signed   By: Marcello Moores  Register   On: 02/09/2014 07:31   Dg Chest Port 1 View  02/08/2014   CLINICAL DATA:  Respiratory failure.  EXAM: PORTABLE CHEST - 1 VIEW  COMPARISON:  02/07/2014.  FINDINGS: Endotracheal tube, NG tube, right IJ line in stable position. Stable cardiomegaly with normal pulmonary vascularity. Interim partial resolution of left lower lobe atelectatic changes . No pleural effusion or pneumothorax .  IMPRESSION: 1. Line and tube position stable.  2. Interim partial resolution of left lower lobe atelectatic changes.   Electronically Signed   By: Marcello Moores  Register   On: 02/08/2014 07:21   Dg Chest Port 1 View  02/07/2014   CLINICAL DATA:  Respiratory failure.  EXAM: PORTABLE CHEST - 1 VIEW  COMPARISON:  02/06/2014  FINDINGS: Endotracheal tube tip is approximately 2.5 cm above the carina. Right-sided jugular central line tip stable in the SVC. Lungs show relatively stable atelectasis/ consolidation of the left lower lobe. There is minimal atelectasis at the right lung base. No edema, pneumothorax or significant pleural fluid identified. Stable prominence of the thoracic aorta and normal heart size.  IMPRESSION: Stable atelectasis/ consolidation of the  left lower lobe.   Electronically Signed   By: Aletta Edouard M.D.   On: 02/07/2014 07:49   Dg Chest Port 1 View  02/06/2014   CLINICAL DATA:  Central catheter placement  EXAM: PORTABLE CHEST - 1 VIEW  COMPARISON:  Study obtained earlier in the day  FINDINGS: The central catheter tip is in the superior vena cava near the cavoatrial junction. Endotracheal tube tip is 2.3 cm above the carina. Nasogastric tube tip and side port are in the stomach. No pneumothorax. There is mild atelectatic change in the left base. Elsewhere lungs are clear. Heart size and pulmonary vascularity are normal. Prominence the aortic arch is stable.  IMPRESSION: Tube and catheter positions as described without pneumothorax. Prominence  of the aortic arch is probably indicative of chronic hypertensive change. There is mild atelectatic change in the left base. Elsewhere lungs are clear.   Electronically Signed   By: Lowella Grip M.D.   On: 02/06/2014 21:56   Dg Chest Portable 1 View  02/06/2014   CLINICAL DATA:  Endotracheal tube placement and altered mental status.  EXAM: PORTABLE CHEST - 1 VIEW  COMPARISON:  02/15/2013  FINDINGS: Endotracheal tube terminates 4.6 cm above carina. Nasogastric tube terminates at the proximal stomach.  Midline trachea. Mild cardiomegaly. No pleural effusion or pneumothorax. Clear lungs.  IMPRESSION: Appropriate position of endotracheal tube.  Cardiomegaly without congestive failure.   Electronically Signed   By: Abigail Miyamoto M.D.   On: 02/06/2014 15:23   Dg Abd Portable 1v  02/08/2014   CLINICAL DATA:  Orogastric tube insertion.  EXAM: PORTABLE ABDOMEN - 1 VIEW  COMPARISON:  11/04/2013.  FINDINGS: Orogastric tube tip in the region of the pylorus. Bowel gas pattern is nonobstructed. The visualized lung bases are clear. Ostomy noted in the right lower quadrant.  IMPRESSION: Nasogastric tube tip is near the pylorus.   Electronically Signed   By: Jorje Guild M.D.   On: 02/08/2014 19:40     Microbiology: Recent Results (from the past 240 hour(s))  CULTURE, BLOOD (ROUTINE X 2)     Status: None   Collection Time    02/06/14  2:47 PM      Result Value Ref Range Status   Specimen Description BLOOD RIGHT WRIST   Final   Special Requests BOTTLES DRAWN AEROBIC ONLY 3CC   Final   Culture  Setup Time     Final   Value: 02/06/2014 22:47     Performed at [facility]   Culture     Final   Value: NO GROWTH 5 DAYS     Performed at [facility]   Report Status 02/12/2014 FINAL   Final  MRSA PCR SCREENING     Status: None   Collection Time    02/06/14  6:49 PM      Result Value Ref Range Status   MRSA by PCR NEGATIVE  NEGATIVE Final   Comment:            The GeneXpert MRSA Assay (FDA     approved for NASAL specimens     only), is one component of a     comprehensive MRSA colonization     surveillance program. It is not     intended to diagnose MRSA     infection nor to guide or     monitor treatment for     MRSA infections.  CULTURE, BLOOD (ROUTINE X 2)     Status: None   Collection Time    02/06/14  7:34 PM      Result Value Ref Range Status   Specimen Description BLOOD RIGHT WRIST   Final   Special Requests BOTTLES DRAWN AEROBIC ONLY 5CC   Final   Culture  Setup Time     Final   Value: 02/07/2014 02:00     Performed at [facility]   Culture     Final   Value: NO GROWTH 5 DAYS     Performed at [facility]   Report Status 02/13/2014 FINAL   Final     Labs: Basic Metabolic Panel:  Recent Labs Lab 02/08/14 0400 02/09/14 0500 02/10/14 0450 02/10/14 0640 02/11/14 0615  NA 152* 153* 154*  --  154*  K 4.0 3.8 3.7  --  4.1  CL 112 113* 111  --  112  CO2 24 25 26   --  28  GLUCOSE 104* 162* 162*  --  185*  BUN 28* 28* 34*  --  44*  CREATININE 1.74* 1.57* 1.69*  --  1.72*  CALCIUM 9.1 9.4 10.0  --  9.6  MG  --   --   --  2.1  --    Liver Function Tests: No results found for this basename: AST, ALT, ALKPHOS, BILITOT, PROT,  ALBUMIN,  in the last 168 hours No results found for this basename: LIPASE, AMYLASE,  in the last 168 hours No results found for this basename: AMMONIA,  in the last 168 hours CBC:  Recent Labs Lab 02/08/14 0400 02/09/14 0500 02/10/14 0450 02/11/14 0615  WBC 11.9* 10.0 10.4 10.7*  HGB 12.7* 13.0 13.1 12.6*  HCT 38.1* 39.1 39.9 39.0  MCV 91.4 90.7 91.7 93.1  PLT 202 202 203 213   Cardiac Enzymes: No results found for this basename: CKTOTAL, CKMB, CKMBINDEX, TROPONINI,  in the last 168 hours BNP: BNP (last 3 results) No results found for this basename: PROBNP,  in the last 8760 hours CBG:  Recent Labs Lab 02/11/14 0420 02/11/14 0833 02/11/14 1146 02/11/14 1541 02/11/14 1955  GLUCAP 195* 127* 135* 116* 110*       Signed:  Rowe Clack N  [facility] 02/14/2014, 9:49 AM    Addendum: sepsis present on admission;  Tavoris Brisk

## 2014-02-14 NOTE — Clinical Social Work Note (Signed)
CSW received handoff stating pt will be transitioned into residential hospice care at time of discharge. Per handoff, pt's family preference was for either [facility] or [facility]. Pt was offered bed placement with [facility] residential hospice. CSW met with pt's family regarding bed availability. Pt's family has accepted bed offer with [facility] residential hospice. Admissions paperwork has been completed at bedside with [facility] admissions liaison.  CSW has completed discharge packet and has arranged for ambulance transportation via EMS ([facility]).  Lubertha Sayres, MSW, [facility] Licensed Clinical Social Worker [phone] and [phone]

## 2014-02-16 ENCOUNTER — Ambulatory Visit
Payer: Medicare PPO | Attending: Rehabilitative and Restorative Service Providers" | Admitting: Rehabilitative and Restorative Service Providers"

## 2014-03-01 ENCOUNTER — Telehealth: Payer: Self-pay

## 2014-03-01 NOTE — Telephone Encounter (Signed)
Make hospital f/u in 1-2 weeks pls ----- Message ----- From: Kinnie Feil, MD Sent: 02/23/2014 3:02 PM To: Blanchie Serve, MD  Left message on voicemail for patient to return call to schedule appointment for ER follow-up, appointment available tomorrow. Patient instructed to call to lock in appointment time for 4:00 pm tomorrow with Dr.Pandey

## 2014-03-02 DEATH — deceased

## 2014-03-23 ENCOUNTER — Telehealth: Payer: Self-pay | Admitting: *Deleted

## 2014-03-23 ENCOUNTER — Ambulatory Visit: Payer: Medicare HMO | Admitting: Internal Medicine

## 2014-03-23 DIAGNOSIS — Z0289 Encounter for other administrative examinations: Secondary | ICD-10-CM

## 2014-03-23 NOTE — Telephone Encounter (Signed)
Called pt due to missing appointment on 7/22 to return call to reschedule appt.

## 2014-04-04 NOTE — Telephone Encounter (Signed)
lft another message to rtn call to schedule a f/u appt.
